# Patient Record
Sex: Male | Born: 1945
Health system: Southern US, Community
[De-identification: ages and names within clinical notes are randomized; demographics above are authoritative.]

## PROBLEM LIST (undated history)

## (undated) DIAGNOSIS — N529 Male erectile dysfunction, unspecified: Secondary | ICD-10-CM

## (undated) DIAGNOSIS — C801 Malignant (primary) neoplasm, unspecified: Secondary | ICD-10-CM

## (undated) DIAGNOSIS — L570 Actinic keratosis: Secondary | ICD-10-CM

## (undated) DIAGNOSIS — M1990 Unspecified osteoarthritis, unspecified site: Secondary | ICD-10-CM

## (undated) DIAGNOSIS — T7840XA Allergy, unspecified, initial encounter: Secondary | ICD-10-CM

## (undated) DIAGNOSIS — E785 Hyperlipidemia, unspecified: Secondary | ICD-10-CM

## (undated) DIAGNOSIS — N400 Enlarged prostate without lower urinary tract symptoms: Secondary | ICD-10-CM

## (undated) DIAGNOSIS — H269 Unspecified cataract: Secondary | ICD-10-CM

## (undated) HISTORY — DX: Benign prostatic hyperplasia without lower urinary tract symptoms: N40.0

## (undated) HISTORY — DX: Male erectile dysfunction, unspecified: N52.9

## (undated) HISTORY — DX: Hyperlipidemia, unspecified: E78.5

## (undated) HISTORY — PX: CERVICAL SPINE SURGERY: SHX589

## (undated) HISTORY — DX: Actinic keratosis: L57.0

## (undated) HISTORY — DX: Unspecified osteoarthritis, unspecified site: M19.90

## (undated) HISTORY — DX: Unspecified cataract: H26.9

## (undated) HISTORY — DX: Malignant (primary) neoplasm, unspecified: C80.1

## (undated) HISTORY — PX: EYE SURGERY: SHX253

## (undated) HISTORY — PX: OTHER SURGICAL HISTORY: SHX169

## (undated) HISTORY — DX: Allergy, unspecified, initial encounter: T78.40XA

## (undated) HISTORY — PX: CATARACT EXTRACTION: SUR2

---

## 2003-03-28 ENCOUNTER — Ambulatory Visit (HOSPITAL_COMMUNITY): Admission: RE | Admit: 2003-03-28 | Discharge: 2003-03-28 | Payer: Self-pay | Admitting: Gastroenterology

## 2004-03-09 ENCOUNTER — Encounter: Admission: RE | Admit: 2004-03-09 | Discharge: 2004-03-09 | Payer: Self-pay | Admitting: Orthopedic Surgery

## 2006-03-26 ENCOUNTER — Encounter: Admission: RE | Admit: 2006-03-26 | Discharge: 2006-03-26 | Payer: Self-pay | Admitting: Orthopedic Surgery

## 2006-05-25 ENCOUNTER — Encounter: Admission: RE | Admit: 2006-05-25 | Discharge: 2006-05-25 | Payer: Self-pay | Admitting: Neurological Surgery

## 2006-06-25 ENCOUNTER — Ambulatory Visit (HOSPITAL_COMMUNITY): Admission: RE | Admit: 2006-06-25 | Discharge: 2006-06-26 | Payer: Self-pay | Admitting: Neurological Surgery

## 2006-07-30 ENCOUNTER — Encounter: Admission: RE | Admit: 2006-07-30 | Discharge: 2006-07-30 | Payer: Self-pay | Admitting: Neurological Surgery

## 2006-09-22 ENCOUNTER — Encounter: Admission: RE | Admit: 2006-09-22 | Discharge: 2006-09-22 | Payer: Self-pay | Admitting: Neurological Surgery

## 2006-12-22 ENCOUNTER — Encounter: Admission: RE | Admit: 2006-12-22 | Discharge: 2006-12-22 | Payer: Self-pay | Admitting: Neurological Surgery

## 2007-06-15 ENCOUNTER — Encounter: Admission: RE | Admit: 2007-06-15 | Discharge: 2007-06-15 | Payer: Self-pay | Admitting: Neurological Surgery

## 2007-10-07 HISTORY — PX: COLONOSCOPY: SHX174

## 2008-06-09 ENCOUNTER — Ambulatory Visit: Payer: Self-pay | Admitting: Gastroenterology

## 2008-06-23 ENCOUNTER — Ambulatory Visit: Payer: Self-pay | Admitting: Gastroenterology

## 2008-09-10 ENCOUNTER — Emergency Department (HOSPITAL_COMMUNITY): Admission: EM | Admit: 2008-09-10 | Discharge: 2008-09-10 | Payer: Self-pay | Admitting: Emergency Medicine

## 2011-02-21 NOTE — Op Note (Signed)
Samuel Wheeler, Samuel Wheeler NO.:  [id number]   MEDICAL RECORD NO.:  [id number]          PATIENT TYPE:  OIB   LOCATION:  3172                         FACILITY:  [facility]   PHYSICIAN:  Tia Alert, MD     DATE OF BIRTH:  1946/06/06   DATE OF PROCEDURE:  06/25/2006  DATE OF DISCHARGE:                                 OPERATIVE REPORT   PREOPERATIVE DIAGNOSIS:  Cervical spondylosis C3-4 and C5-6 with neural  foraminal stenosis C3-4 on the left and C5-6 on left with left neck and  shoulder pain.   POSTOP DIAGNOSIS:  Cervical spondylosis C3-4 and C5-6 with neural foraminal  stenosis C3-4 on the left and C5-6 on left with left neck and shoulder pain.   PROCEDURE:  1. Decompressive anterior cervical diskectomy C3-4 and C5-6.  2. Anterior cervical fusion C3-4 and C5-6 utilizing a 7-mm PEEK interbody      cage packed with local autograft and DBX putty at C3-4; and a 6-mm      interbody PEEK cage packed with local autograft and DBX putty at C5-6.  3. Anterior cervical plating C3-4 and C5-6 utilizing two 25-mm Venture      plates with 04-VW screws.   SURGEON:  Dr. Marikay Alar   ASSISTANT:  Reinaldo Meeker, M.D.   ANESTHESIA:  General endotracheal.   COMPLICATIONS:  None apparent.   INDICATIONS FOR THE PROCEDURE:  Samuel Wheeler is a 65 year old white male who is  referred with severe neck and left arm pain.  He had an MRI and then a CT  myelogram which showed spondylosis at C3-4 and C5-6 with cut off of the  nerve roots sleeves at C4and C6 on the left side.  I recommended a two-level  anterior cervical diskectomy C3-4, C5-6.  He understood the risks, benefits,  and expected outcome; and wished to proceed.   DESCRIPTION OF PROCEDURE:  The patient was taken to operating room and after  induction of adequate generalized endotracheal anesthesia, he was placed in  the supine position on the operating room table.  His right anterior  cervical region was prepped with DuraPrep and  then draped in the usual  sterile fashion.  Then 4 mL of local anesthesia was injected; and a  transverse incision was made to the right of midline and carried down to the  platysma which was elevated, opened, and undermined with Metzenbaum  scissors.  I then dissected a plane medial to the sternocleidomastoid muscle  and internal carotid artery, and lateral to the trachea and esophagus to  expose C3-4 and C5-6.  Intraoperative fluoroscopy confirmed my level at C3-4  and at C5-6; and the longus colli muscles were taken down bilaterally, and  the shadow line retractors were placed under these to expose C3-4 and C5-6.   I incised the annulus at C3-4 and did the initial diskectomy with pituitary  rongeurs and curved curettes.  We then drilled the endplates with the high-  speed drill down to the level of the posterior longitudinal ligament.  We  saved the drill shavings in a mucous trap for  later use during arthrodesis.  We brought in the operating microscope, and opened the posterior and  longitudinal ligament with a nerve hook; and then undercut the bodies of C3  and C4 with the 2-mm and 1-mm Kerrison punch, decompressing the central  canal and bilateral foramina.  The C4 nerve root was identified bilaterally  and fully decompressed.  We could see a significant expanse of the nerve  root, especially on the left side.  We went along the superior endplate of  C4 until we found the pedicle and marched out the pedicle, decompressing the  nerve root along the way.  We then palpated with a nerve hook, in  circumferential fashion, to assure adequate decompression of the central  canal and the bilateral foramina.   We dried the interspace with Gelfoam, removed the Gelfoam, irrigated the  interspace, and then measured the interspace to be 7 mm.  We used a 7 mm  PEEK interbody cage packed with local autograft and DBX putty and tapped  this into position at C3-4.  We then used to a 25 mm Venture  plate; and  placed two 13-mm variable angle screws into the bodies of C3 and C4.  These  were locked into position with a locking mechanism on the plate.  We then  went down to C5-6 where we incised the disk and the annulus, and performed  the initial diskectomy with pituitary rongeurs and curved curettes.  We then  brought in the operating microscope and opened the posterior longitudinal  ligament with a hook after drilling the endplates down to the level of the  posterior longitudinal ligament.  The drill shavings were, once again, saved  for later arthrodesis.  We undercut the body of C5 and C6 to decompress the  central canal, and then performed bilateral foraminotomies; again, marching  along the superior part of the pedicle to decompress the nerve root.  The  nerve root was identified and decompressed for a significant distance into  the foramen.  We then palpated with a nerve hook to assure adequate  decompression.  I measured the interspace to be 6 mm.  At this time we used  a 6-mm PEEK interbody cage packed local autograft and DBX putty and tapped  these into position at C5-6.   We then irrigated with saline solution containing bacitracin, dried all  bleeding points with bipolar cautery, and used a 25-mm Venture plate at I6-9  and placed two 13-mm variable angle screws into the bodies of C5 and C6.  We  irrigated, once again, dried all bleeding points with bipolar cautery; and  once meticulous hemostasis was achieved, we placed a 7-flat JP drain through  a separate stab incision, and tied this into place; and then closed the  platysma with 3-0 Vicryl, closed the subcuticular tissue with 3-0 Vicryl,  and closed skin with Benzoin and Steri-Strips.  The drapes removed and a  sterile dressing was applied.  The patient was awakened from general  anesthesia; and transported to the recovery room in stable condition.  At the end of procedure all sponge, needle, and sponge counts were  correct.      Tia Alert, MD  Electronically Signed     DSJ/MEDQ  D:  06/25/2006  T:  06/27/2006  Job:  [id number]

## 2012-02-19 DIAGNOSIS — L0291 Cutaneous abscess, unspecified: Secondary | ICD-10-CM | POA: Diagnosis not present

## 2012-02-24 DIAGNOSIS — M25519 Pain in unspecified shoulder: Secondary | ICD-10-CM | POA: Diagnosis not present

## 2012-02-25 DIAGNOSIS — M25519 Pain in unspecified shoulder: Secondary | ICD-10-CM | POA: Diagnosis not present

## 2012-02-26 DIAGNOSIS — M25519 Pain in unspecified shoulder: Secondary | ICD-10-CM | POA: Diagnosis not present

## 2012-03-08 DIAGNOSIS — L600 Ingrowing nail: Secondary | ICD-10-CM | POA: Diagnosis not present

## 2012-03-16 DIAGNOSIS — L600 Ingrowing nail: Secondary | ICD-10-CM | POA: Diagnosis not present

## 2012-03-16 DIAGNOSIS — M79609 Pain in unspecified limb: Secondary | ICD-10-CM | POA: Diagnosis not present

## 2012-03-16 DIAGNOSIS — L02619 Cutaneous abscess of unspecified foot: Secondary | ICD-10-CM | POA: Diagnosis not present

## 2012-03-23 DIAGNOSIS — M719 Bursopathy, unspecified: Secondary | ICD-10-CM | POA: Diagnosis not present

## 2012-03-23 DIAGNOSIS — M75100 Unspecified rotator cuff tear or rupture of unspecified shoulder, not specified as traumatic: Secondary | ICD-10-CM | POA: Diagnosis not present

## 2012-03-23 DIAGNOSIS — M67919 Unspecified disorder of synovium and tendon, unspecified shoulder: Secondary | ICD-10-CM | POA: Diagnosis not present

## 2012-03-23 DIAGNOSIS — M948X9 Other specified disorders of cartilage, unspecified sites: Secondary | ICD-10-CM | POA: Diagnosis not present

## 2012-03-31 DIAGNOSIS — S46819A Strain of other muscles, fascia and tendons at shoulder and upper arm level, unspecified arm, initial encounter: Secondary | ICD-10-CM | POA: Diagnosis not present

## 2012-03-31 DIAGNOSIS — S43499A Other sprain of unspecified shoulder joint, initial encounter: Secondary | ICD-10-CM | POA: Diagnosis not present

## 2012-04-15 DIAGNOSIS — M75120 Complete rotator cuff tear or rupture of unspecified shoulder, not specified as traumatic: Secondary | ICD-10-CM | POA: Diagnosis not present

## 2012-04-21 DIAGNOSIS — M75120 Complete rotator cuff tear or rupture of unspecified shoulder, not specified as traumatic: Secondary | ICD-10-CM | POA: Diagnosis not present

## 2012-04-28 DIAGNOSIS — M75120 Complete rotator cuff tear or rupture of unspecified shoulder, not specified as traumatic: Secondary | ICD-10-CM | POA: Diagnosis not present

## 2012-05-03 DIAGNOSIS — M75120 Complete rotator cuff tear or rupture of unspecified shoulder, not specified as traumatic: Secondary | ICD-10-CM | POA: Diagnosis not present

## 2012-05-05 DIAGNOSIS — M75120 Complete rotator cuff tear or rupture of unspecified shoulder, not specified as traumatic: Secondary | ICD-10-CM | POA: Diagnosis not present

## 2012-05-11 DIAGNOSIS — M75120 Complete rotator cuff tear or rupture of unspecified shoulder, not specified as traumatic: Secondary | ICD-10-CM | POA: Diagnosis not present

## 2012-05-13 DIAGNOSIS — M75120 Complete rotator cuff tear or rupture of unspecified shoulder, not specified as traumatic: Secondary | ICD-10-CM | POA: Diagnosis not present

## 2012-05-18 DIAGNOSIS — M75120 Complete rotator cuff tear or rupture of unspecified shoulder, not specified as traumatic: Secondary | ICD-10-CM | POA: Diagnosis not present

## 2012-05-25 DIAGNOSIS — M75120 Complete rotator cuff tear or rupture of unspecified shoulder, not specified as traumatic: Secondary | ICD-10-CM | POA: Diagnosis not present

## 2012-06-01 DIAGNOSIS — M75120 Complete rotator cuff tear or rupture of unspecified shoulder, not specified as traumatic: Secondary | ICD-10-CM | POA: Diagnosis not present

## 2012-06-08 DIAGNOSIS — M75120 Complete rotator cuff tear or rupture of unspecified shoulder, not specified as traumatic: Secondary | ICD-10-CM | POA: Diagnosis not present

## 2012-06-15 DIAGNOSIS — M75120 Complete rotator cuff tear or rupture of unspecified shoulder, not specified as traumatic: Secondary | ICD-10-CM | POA: Diagnosis not present

## 2012-07-06 DIAGNOSIS — Z23 Encounter for immunization: Secondary | ICD-10-CM | POA: Diagnosis not present

## 2012-07-14 DIAGNOSIS — R1310 Dysphagia, unspecified: Secondary | ICD-10-CM | POA: Diagnosis not present

## 2012-07-14 DIAGNOSIS — D131 Benign neoplasm of stomach: Secondary | ICD-10-CM | POA: Diagnosis not present

## 2012-07-28 DIAGNOSIS — H5319 Other subjective visual disturbances: Secondary | ICD-10-CM | POA: Diagnosis not present

## 2012-10-18 DIAGNOSIS — M25519 Pain in unspecified shoulder: Secondary | ICD-10-CM | POA: Diagnosis not present

## 2012-10-18 DIAGNOSIS — M109 Gout, unspecified: Secondary | ICD-10-CM | POA: Diagnosis not present

## 2012-10-18 DIAGNOSIS — R5383 Other fatigue: Secondary | ICD-10-CM | POA: Diagnosis not present

## 2012-10-18 DIAGNOSIS — M2550 Pain in unspecified joint: Secondary | ICD-10-CM | POA: Diagnosis not present

## 2012-10-18 DIAGNOSIS — N400 Enlarged prostate without lower urinary tract symptoms: Secondary | ICD-10-CM | POA: Diagnosis not present

## 2012-10-18 DIAGNOSIS — E785 Hyperlipidemia, unspecified: Secondary | ICD-10-CM | POA: Diagnosis not present

## 2012-10-18 DIAGNOSIS — R5381 Other malaise: Secondary | ICD-10-CM | POA: Diagnosis not present

## 2012-10-18 DIAGNOSIS — E559 Vitamin D deficiency, unspecified: Secondary | ICD-10-CM | POA: Diagnosis not present

## 2012-11-15 DIAGNOSIS — E785 Hyperlipidemia, unspecified: Secondary | ICD-10-CM | POA: Diagnosis not present

## 2012-11-15 DIAGNOSIS — IMO0001 Reserved for inherently not codable concepts without codable children: Secondary | ICD-10-CM | POA: Diagnosis not present

## 2013-01-24 ENCOUNTER — Encounter: Payer: Self-pay | Admitting: Family Medicine

## 2013-01-27 ENCOUNTER — Ambulatory Visit (INDEPENDENT_AMBULATORY_CARE_PROVIDER_SITE_OTHER): Payer: Medicare Other | Admitting: Family Medicine

## 2013-01-27 ENCOUNTER — Encounter: Payer: Self-pay | Admitting: Family Medicine

## 2013-01-27 VITALS — BP 136/85 | HR 63 | Temp 97.8°F | Ht 69.5 in | Wt 210.4 lb

## 2013-01-27 DIAGNOSIS — A6000 Herpesviral infection of urogenital system, unspecified: Secondary | ICD-10-CM

## 2013-01-27 DIAGNOSIS — Z125 Encounter for screening for malignant neoplasm of prostate: Secondary | ICD-10-CM | POA: Diagnosis not present

## 2013-01-27 DIAGNOSIS — N529 Male erectile dysfunction, unspecified: Secondary | ICD-10-CM | POA: Insufficient documentation

## 2013-01-27 DIAGNOSIS — B999 Unspecified infectious disease: Secondary | ICD-10-CM

## 2013-01-27 DIAGNOSIS — N400 Enlarged prostate without lower urinary tract symptoms: Secondary | ICD-10-CM | POA: Insufficient documentation

## 2013-01-27 DIAGNOSIS — Z2911 Encounter for prophylactic immunotherapy for respiratory syncytial virus (RSV): Secondary | ICD-10-CM | POA: Diagnosis not present

## 2013-01-27 DIAGNOSIS — Z0000 Encounter for general adult medical examination without abnormal findings: Secondary | ICD-10-CM

## 2013-01-27 DIAGNOSIS — Z23 Encounter for immunization: Secondary | ICD-10-CM | POA: Insufficient documentation

## 2013-01-27 DIAGNOSIS — E785 Hyperlipidemia, unspecified: Secondary | ICD-10-CM | POA: Insufficient documentation

## 2013-01-27 DIAGNOSIS — L570 Actinic keratosis: Secondary | ICD-10-CM | POA: Insufficient documentation

## 2013-01-27 LAB — COMPLETE METABOLIC PANEL WITH GFR
ALT: 23 U/L (ref 0–53)
AST: 19 U/L (ref 0–37)
Albumin: 4.1 g/dL (ref 3.5–5.2)
Alkaline Phosphatase: 63 U/L (ref 39–117)
BUN: 20 mg/dL (ref 6–23)
CO2: 28 mEq/L (ref 19–32)
Calcium: 9.2 mg/dL (ref 8.4–10.5)
Chloride: 106 mEq/L (ref 96–112)
Creat: 1.02 mg/dL (ref 0.50–1.35)
GFR, Est African American: 89 mL/min
GFR, Est Non African American: 77 mL/min
Glucose, Bld: 101 mg/dL — ABNORMAL HIGH (ref 70–99)
Potassium: 4.6 mEq/L (ref 3.5–5.3)
Sodium: 140 mEq/L (ref 135–145)
Total Bilirubin: 0.9 mg/dL (ref 0.3–1.2)
Total Protein: 6.8 g/dL (ref 6.0–8.3)

## 2013-01-27 LAB — PSA: PSA: 1.15 ng/mL (ref <=4.00)

## 2013-01-27 LAB — HEPATITIS C ANTIBODY: HCV Ab: NEGATIVE

## 2013-01-27 NOTE — Progress Notes (Signed)
Tolerated injection well. 

## 2013-01-27 NOTE — Patient Instructions (Addendum)
Immunizations: Tetanus-Diphtheria Booster due:2020 Pertusis Booster due:2020 Flu Shot Due:in the Fall Pneumonia Vaccine:given at 65 years Herpes Zoster/Shingles Vaccine JXB:JYNWG HPV due:n/a  Healthy Life Habits: Exercise Goal: 5-6 days/week; start gradually(ie 30 minutes/3days per week) Nutrition: Balanced healthy meals including Vegetables and Fruits. Consider  Reading the following books  :1) Eat to Live by Dr Ottis Stain ;2) Prevent and Reverse Heart Disease by Dr Suzzette Righter. Vitamins: okay to take a multivitamin Aspirin:81mg  three times a week Stop Tobacco Use:n/a Seat Belt Use:++++ Sunscreen Use:++++ Osteoporosis Prevention: 1) Exercise 2) Calcium/Vitamin D requirements:he [employer] of the [employer] recommends:    Calcium:  800 mg/day for children 96-90 years of age          66 mg/day for children 85-78 years of age          66 mg/day for adults 5-45 years of age          66 mg/day for everyone more than 66 years of age     Vitamin D: 800 IU per day or as prescribed if you are deficient.  Recommended Screening Tests: Colon Cancer Screening:with 's Blood work: Cholesterol Screening: ++       HIV:  ++       Hepatitis C(people born 1945-1965):++ Mammogram:n/a DEXA/Bone Density:n/a GYN Exam:n/a Monthly Self Breast Exam:n/a Monthly Self Testicular Exam:++++ Eye Exam: at least every 1 to 2 years as recommended by your eye specialist Dental Health:at least every 6 months  Others: Living Will/Healthcare Power of Attorney:+++++  Shingles Shingles is caused by the same virus that causes chickenpox (varicella zoster virus or VZV). Shingles often occurs many years or decades after having chickenpox. That is why it is more common in adults older than 50 years. The virus reactivates and breaks out as an infection in a nerve root. SYMPTOMS   The initial feeling (sensations) may be pain. This pain is usually described  as:  Burning.  Stabbing.  Throbbing.  Tingling in the nerve root.  A red rash will follow in a couple days. The rash may occur in any area of the body and is usually on one side (unilateral) of the body in a band or belt-like pattern. The rash usually starts out as very small blisters (vesicles). They will dry up after 7 to 10 days. This is not usually a significant problem except for the pain it causes.  Long-lasting (chronic) pain is more likely in an elderly person. It can last months to years. This condition is called postherpetic neuralgia. Shingles can be an extremely severe infection in someone with AIDS, a weakened immune system, or with forms of leukemia. It can also be severe if you are taking transplant medicines or other medicines that weaken the immune system. TREATMENT  Your caregiver will often treat you with:  Antiviral drugs.  Anti-inflammatory drugs.  Pain medicines. Bed rest is very important in preventing the pain associated with herpes zoster (postherpetic neuralgia). Application of heat in the form of a hot water bottle or electric heating pad or gentle pressure with the hand is recommended to help with the pain or discomfort. PREVENTION  A varicella zoster vaccine is available to help protect against the virus. The Food and Drug Administration approved the varicella zoster vaccine for individuals 43 years of age and older. HOME CARE INSTRUCTIONS   Cool compresses to the area of rash may be helpful.  Only take over-the-counter or prescription medicines for pain, discomfort, or fever as directed by  your caregiver.  Avoid contact with:  Babies.  Pregnant women.  Children with eczema.  Elderly people with transplants.  People with chronic illnesses, such as leukemia and AIDS.  If the area involved is on your face, you may receive a referral for follow-up to a specialist. It is very important to keep all follow-up appointments. This will help avoid eye  complications, chronic pain, or disability. SEEK IMMEDIATE MEDICAL CARE IF:   You develop any pain (headache) in the area of the face or eye. This must be followed carefully by your caregiver or ophthalmologist. An infection in part of your eye (cornea) can be very serious. It could lead to blindness.  You do not have pain relief from prescribed medicines.  Your redness or swelling spreads.  The area involved becomes very swollen and painful.  You have a fever.  You notice any red or painful lines extending away from the affected area toward your heart (lymphangitis).  Your condition is worsening or has changed. Document Released: 09/22/2005 Document Revised: 12/15/2011 Document Reviewed: 08/27/2009 [facility] Patient Information 2013 [place], [place].

## 2013-01-27 NOTE — Progress Notes (Signed)
Patient ID: Samuel Wheeler, male   DOB: [birth year], 67 y.o.   MRN: [id number] SUBJECTIVE: HPI: Here for an annual physical. Feels fine. Problems stable with meds no complaints   PMH/PSH: reviewed/updated in Epic  SH/FH: reviewed/updated in Epic  Allergies: reviewed/updated in Epic  Medications: reviewed/updated in Epic  Immunizations: reviewed/updated in Epic  ROS: As above in the HPI. All other systems are stable or negative.  OBJECTIVE: APPEARANCE: White male centrally obese  Patient in no acute distress.The patient appeared well nourished and normally developed. Acyanotic. Waist:41.25 inches VITAL SIGNS:BP 136/85  Pulse 63  Temp(Src) 97.8 F (36.6 C) (Oral)  Ht 5' 9.5" (1.765 m)  Wt 210 lb 6.4 oz (95.437 kg)  BMI 30.64 kg/m2   SKIN: warm and  Dry without overt rashes, tattoos and scars. Few scatterred benign nevi &  HEAD and Neck: without JVD, Head and scalp: normal Eyes:No scleral icterus. Fundi normal, eye movements normal. Ears: Auriclehas a scaly lesion on the edge., canal normal, Tympanic membranes normal, insufflation normal. Nose: normal Throat: normal Neck & thyroid: normal  CHEST & LUNGS: Chest wall: normal Lungs: Clear  CVS: Reveals the PMI to be normally located. Regular rhythm, First and Second Heart sounds are normal,  absence of murmurs, rubs or gallops. Peripheral vasculature: Radial pulses: normal Dorsal pedis pulses: normal Posterior pulses: normal  ABDOMEN:  Appearance: normal Benign,, no organomegaly, no masses, no Abdominal Aortic enlargement. No Guarding , no rebound. No Bruits. Bowel sounds: normal  RECTAL: normal heme negative GU: varicoceles and cysts felt. , no testicular lumps  EXTREMETIES: nonedematous. Both Femoral and Pedal pulses are normal.  MUSCULOSKELETAL:  Spine: normal Joints: intact  NEUROLOGIC: oriented to time,place and person; nonfocal. Strength is normal Sensory is normal Reflexes are  normal Cranial Nerves are normal.  ASSESSMENT: Medicare annual wellness visit, initial - Plan: EKG 12-Lead, PSA  HLD (hyperlipidemia) - Plan: COMPLETE METABOLIC PANEL WITH GFR, NMR Lipoprofile with Lipids  BPH (benign prostatic hyperplasia)  Erectile dysfunction  Need for zoster vaccination - Plan: Varicella-zoster vaccine subcutaneous  Genital herpes simplex  Communicable disease - Plan: Hepatitis C antibody  Actinic keratosis    PLAN:   mmunizations: Tetanus-Diphtheria Booster due:2020 Pertusis Booster due:2020 Flu Shot Due:in the Fall Pneumonia Vaccine:given at 65 years Herpes Zoster/Shingles Vaccine FAO:ZHYQM HPV due:n/a  Healthy Life Habits: Exercise Goal: 5-6 days/week; start gradually(ie 30 minutes/3days per week) Nutrition: Balanced healthy meals including Vegetables and Fruits. Consider  Reading the following books  :1) Eat to Live by Dr Ottis Stain ;2) Prevent and Reverse Heart Disease by Dr Suzzette Righter. Vitamins: okay to take a multivitamin Aspirin:81mg  three times a week Stop Tobacco Use:n/a Seat Belt Use:++++ Sunscreen Use:++++ Osteoporosis Prevention: 1) Exercise 2) Calcium/Vitamin D requirements:he [employer] of the [employer] recommends:    Calcium:  800 mg/day for children 74-58 years of age          66 mg/day for children 15-41 years of age          66 mg/day for adults 17-66 years of age          66 mg/day for everyone more than 66 years of age     Vitamin D: 800 IU per day or as prescribed if you are deficient.  Recommended Screening Tests: Colon Cancer Screening:with [place]'s Blood work: Cholesterol Screening: ++       HIV:  ++       Hepatitis C(people born 1945-1965):++ Mammogram:n/a DEXA/Bone Density:n/a GYN Exam:n/a Monthly  Self Breast Exam:n/a Monthly Self Testicular Exam:++++ Eye Exam: at least every 1 to 2 years as recommended by your eye specialist Dental Health:at least every 6  months  Others: Living Will/Healthcare Power of Attorney:+++++  Orders Placed This Encounter  Procedures  . Varicella-zoster vaccine subcutaneous  . COMPLETE METABOLIC PANEL WITH GFR  . NMR Lipoprofile with Lipids  . PSA  . Hepatitis C antibody  . EKG 12-Lead   Meds ordered this encounter  Medications  . Cetirizine HCl (ZYRTEC ALLERGY) 10 MG CAPS    Sig: Take 1 capsule by mouth daily.  Marland Kitchen dutasteride (AVODART) 0.5 MG capsule    Sig: Take 0.5 mg by mouth daily.  . niacin (NIASPAN) 1000 MG CR tablet    Sig: Take 1,000 mg by mouth at bedtime.  . mometasone (NASONEX) 50 MCG/ACT nasal spray    Sig: Place 2 sprays into the nose daily.  Marland Kitchen doxazosin (CARDURA) 2 MG tablet    Sig: Take 2 mg by mouth at bedtime.  Marland Kitchen atorvastatin (LIPITOR) 20 MG tablet    Sig: Take 20 mg by mouth daily.  . valACYclovir (VALTREX) 1000 MG tablet    Sig: Take 1,000 mg by mouth daily.    RTC in 6 months.  Kaegan Hettich P. [place] Charon, M.D.

## 2013-01-28 LAB — NMR LIPOPROFILE WITH LIPIDS
Cholesterol, Total: 149 mg/dL (ref <200)
HDL Particle Number: 31 umol/L (ref >=30.5)
HDL Size: 9.4 nm (ref >=9.2)
HDL-C: 41 mg/dL (ref >=40)
LDL (calc): 83 mg/dL (ref <100)
LDL Particle Number: 1031 nmol/L — ABNORMAL HIGH (ref <1000)
LDL Size: 19.7 nm — ABNORMAL LOW (ref >20.5)
LP-IR Score: 60 — ABNORMAL HIGH (ref <=45)
Large HDL-P: 5.1 umol/L (ref >=4.8)
Large VLDL-P: 4.7 nmol/L — ABNORMAL HIGH (ref <=2.7)
Small LDL Particle Number: 787 nmol/L — ABNORMAL HIGH (ref <=527)
Triglycerides: 123 mg/dL (ref <150)
VLDL Size: 55.3 nm — ABNORMAL HIGH (ref <=46.6)

## 2013-02-09 ENCOUNTER — Other Ambulatory Visit: Payer: Self-pay

## 2013-02-09 NOTE — Telephone Encounter (Signed)
Mail order  Last seen 11/15/12   Print and have nurse call patient to pick up

## 2013-02-11 ENCOUNTER — Other Ambulatory Visit: Payer: Self-pay | Admitting: Family Medicine

## 2013-02-11 MED ORDER — DUTASTERIDE 0.5 MG PO CAPS: 0.5000 mg | ORAL_CAPSULE | Freq: Every day | ORAL | Status: DC

## 2013-03-03 ENCOUNTER — Telehealth: Payer: Self-pay | Admitting: *Deleted

## 2013-03-03 DIAGNOSIS — N400 Enlarged prostate without lower urinary tract symptoms: Secondary | ICD-10-CM

## 2013-03-03 MED ORDER — FINASTERIDE 5 MG PO TABS: 5.0000 mg | ORAL_TABLET | Freq: Every day | ORAL | Status: DC

## 2013-03-03 NOTE — Telephone Encounter (Signed)
Needs a Rx for finasteride 5mg . For mail order, ins.is making him change from avodart to this.. Bring printed rx to [facility]

## 2013-03-03 NOTE — Telephone Encounter (Signed)
Rx written for 5mg  finesteride # 90 RFx3.  D/C the avodart Noted in EPIC

## 2013-03-12 ENCOUNTER — Ambulatory Visit (INDEPENDENT_AMBULATORY_CARE_PROVIDER_SITE_OTHER): Payer: Medicare Other | Admitting: Family Medicine

## 2013-03-12 VITALS — BP 136/78 | HR 80 | Temp 97.5°F | Wt 213.0 lb

## 2013-03-12 DIAGNOSIS — R350 Frequency of micturition: Secondary | ICD-10-CM | POA: Diagnosis not present

## 2013-03-12 DIAGNOSIS — J019 Acute sinusitis, unspecified: Secondary | ICD-10-CM | POA: Diagnosis not present

## 2013-03-12 DIAGNOSIS — R351 Nocturia: Secondary | ICD-10-CM

## 2013-03-12 DIAGNOSIS — IMO0001 Reserved for inherently not codable concepts without codable children: Secondary | ICD-10-CM

## 2013-03-12 LAB — POCT URINALYSIS DIPSTICK
Bilirubin, UA: NEGATIVE
Blood, UA: NEGATIVE
Glucose, UA: NEGATIVE
Ketones, UA: NEGATIVE
Leukocytes, UA: NEGATIVE
Nitrite, UA: NEGATIVE
Protein, UA: NEGATIVE
Spec Grav, UA: 1.015
Urobilinogen, UA: 0.2
pH, UA: 5

## 2013-03-12 MED ORDER — AMOXICILLIN 875 MG PO TABS: 875.0000 mg | ORAL_TABLET | Freq: Two times a day (BID) | ORAL | Status: DC

## 2013-03-12 NOTE — Progress Notes (Signed)
Patient ID: Samuel Wheeler, male   DOB: Feb 07, 1946, 67 y.o.   MRN: [id number] SUBJECTIVE: Chief Complaint  Patient presents with  . Acute Visit    HEAD COLD  using aleve d   HPI: Has a head cold and pressure in the sinuses. Used allegra -D and then last night his urine changed. He started to have frequency of urination and hesitancy.no discharge , no std, no back pain, no hematuria. No discharge. No fever, no chills.  PMH/PSH: reviewed/updated in Epic  SH/FH: reviewed/updated in Epic  Allergies: reviewed/updated in Epic  Medications: reviewed/updated in Epic  Immunizations: reviewed/updated in Epic  ROS: As above in the HPI. All other systems are stable or negative.  OBJECTIVE: APPEARANCE:  Patient in no acute distress.The patient appeared well nourished and normally developed. Acyanotic. Waist: VITAL SIGNS:BP 136/78  Pulse 80  Temp(Src) 97.5 F (36.4 C) (Oral)  Wt 213 lb (96.616 kg)  BMI 31.01 kg/m2 Obese WM  SKIN: warm and  Dry without overt rashes, tattoos and scars  HEAD and Neck: without JVD, Head and scalp: normal Eyes:No scleral icterus. Fundi normal, eye movements normal. Ears: Auricle normal, canal normal, Tympanic membranes normal, insufflation normal. Nose: normal Throat: normal Neck & thyroid: normal  CHEST & LUNGS: Chest wall: normal Lungs: Clear  CVS: Reveals the PMI to be normally located. Regular rhythm, First and Second Heart sounds are normal,  absence of murmurs, rubs or gallops. Peripheral vasculature: Radial pulses: normal Dorsal pedis pulses: normal Posterior pulses: normal  ABDOMEN:  Appearance: obese Benign, no organomegaly, no masses, no Abdominal Aortic enlargement. No Guarding , no rebound. No Bruits. Bowel sounds: normal  RECTAL: N/A GU: N/A  EXTREMETIES: nonedematous. Both Femoral and Pedal pulses are normal.  MUSCULOSKELETAL:  Spine: normal Joints: intact  NEUROLOGIC: oriented to time,place and person;  nonfocal. Strength is normal   ASSESSMENT: Nocturia - Plan: CANCELED: POCT urinalysis dipstick  Frequency - Plan: POCT urinalysis dipstick, CANCELED: POCT urinalysis dipstick  Sinusitis, acute - Plan: amoxicillin (AMOXIL) 875 MG tablet  PLAN: Orders Placed This Encounter  Procedures  . POCT urinalysis dipstick   Results for orders placed in visit on 03/12/13  POCT URINALYSIS DIPSTICK      Result Value Range   Color, UA yellow     Clarity, UA clear     Glucose, UA neg     Bilirubin, UA neg     Ketones, UA neg     Spec Grav, UA 1.015     Blood, UA neg     pH, UA 5.0     Protein, UA neg     Urobilinogen, UA 0.2     Nitrite, UA neg     Leukocytes, UA Negative      Meds ordered this encounter  Medications  . naproxen sodium (ANAPROX) 220 MG tablet    Sig: Take 220 mg by mouth 2 (two) times daily with a meal. Uses prn  . amoxicillin (AMOXIL) 875 MG tablet    Sig: Take 1 tablet (875 mg total) by mouth 2 (two) times daily.    Dispense:  20 tablet    Refill:  0  avoid decongestants due to his BPH and urinary symptoms.  Return if symptoms worsen or fail to improve, for Recheck medical problems.   Donn Wilmot P. [place] Charon, M.D.

## 2013-03-15 ENCOUNTER — Telehealth: Payer: Self-pay | Admitting: Family Medicine

## 2013-03-15 ENCOUNTER — Other Ambulatory Visit: Payer: Self-pay | Admitting: Family Medicine

## 2013-03-15 DIAGNOSIS — R059 Cough, unspecified: Secondary | ICD-10-CM

## 2013-03-15 DIAGNOSIS — R05 Cough: Secondary | ICD-10-CM

## 2013-03-15 MED ORDER — BENZONATATE 200 MG PO CAPS: 200.0000 mg | ORAL_CAPSULE | Freq: Three times a day (TID) | ORAL | Status: DC | PRN

## 2013-03-15 NOTE — Telephone Encounter (Signed)
Use his nasonex. Continue same meds. Will order something for cough

## 2013-03-15 NOTE — Telephone Encounter (Signed)
Pt notified rx called to [facility]

## 2013-03-18 ENCOUNTER — Ambulatory Visit (INDEPENDENT_AMBULATORY_CARE_PROVIDER_SITE_OTHER): Payer: Medicare Other | Admitting: Nurse Practitioner

## 2013-03-18 VITALS — BP 119/78 | HR 67 | Temp 97.7°F | Ht 69.5 in | Wt 208.5 lb

## 2013-03-18 DIAGNOSIS — J209 Acute bronchitis, unspecified: Secondary | ICD-10-CM | POA: Diagnosis not present

## 2013-03-18 MED ORDER — AZITHROMYCIN 250 MG PO TABS: ORAL_TABLET | ORAL | Status: DC

## 2013-03-18 MED ORDER — METHYLPREDNISOLONE ACETATE 80 MG/ML IJ SUSP
80.0000 mg | Freq: Once | INTRAMUSCULAR | Status: AC
  Administered 2013-03-18: 80 mg via INTRAMUSCULAR

## 2013-03-18 NOTE — Patient Instructions (Signed)

## 2013-03-18 NOTE — Progress Notes (Signed)
  Subjective:    Patient ID: Samuel Wheeler, male    DOB: 04-Feb-1946, 67 y.o.   MRN: [id number]  HPI Patient in today c/o cough and congestion- Started about 7 days ago- tried OTC cold medicines with no relief.    Review of Systems  Constitutional: Positive for fatigue. Negative for fever.  HENT: Positive for congestion, rhinorrhea, sneezing and sinus pressure. Negative for ear pain.   Eyes: Negative.   Respiratory: Positive for cough. Negative for shortness of breath.   Cardiovascular: Negative.   Gastrointestinal: Negative.   Neurological: Negative.        Objective:   Physical Exam  Constitutional: He is oriented to person, place, and time. He appears well-developed and well-nourished.  HENT:  Right Ear: Hearing, tympanic membrane, external ear and ear canal normal.  Left Ear: Hearing, tympanic membrane, external ear and ear canal normal.  Nose: Mucosal edema and rhinorrhea present. Right sinus exhibits no maxillary sinus tenderness and no frontal sinus tenderness. Left sinus exhibits no maxillary sinus tenderness and no frontal sinus tenderness.  Mouth/Throat: Posterior oropharyngeal erythema present.  Cardiovascular: Regular rhythm and normal heart sounds.   Pulmonary/Chest: Effort normal and breath sounds normal. No respiratory distress. He has no wheezes. He has no rales.  Deep course cough  Abdominal: Soft.  Neurological: He is alert and oriented to person, place, and time.  Skin: Skin is warm.  Psychiatric: He has a normal mood and affect. His behavior is normal. Judgment and thought content normal.          Assessment & Plan:  1. Acute bronchitis 1. Take meds as prescribed 2. Use a cool mist humidifier especially during the winter months and when heat has  been humid. 3. Use saline nose sprays frequently 4. Saline irrigations of the nose can be very helpful if done frequently.  * 4X daily for 1 week*  * Use of a nettie pot can be helpful with this. Follow  directions with this* 5. Drink plenty of fluids 6. Keep thermostat turn down low 7.For any cough or congestion  Use plain Mucinex- regular strength or max strength is fine   * Children- consult with Pharmacist for dosing 8. For fever or aces or pains- take tylenol or ibuprofen appropriate for age and weight.  * for fevers greater than 101 orally you may alternate ibuprofen and tylenol every  3 hours.   - azithromycin (ZITHROMAX Z-PAK) 250 MG tablet; As directed  Dispense: 6 each; Refill: 0 - methylPREDNISolone acetate (DEPO-MEDROL) injection 80 mg; Inject 1 mL (80 mg total) into the muscle once.  Mary-Margaret Daphine Deutscher, FNP

## 2013-06-20 ENCOUNTER — Encounter: Payer: Self-pay | Admitting: *Deleted

## 2013-07-27 DIAGNOSIS — H25019 Cortical age-related cataract, unspecified eye: Secondary | ICD-10-CM | POA: Diagnosis not present

## 2013-07-27 DIAGNOSIS — H5319 Other subjective visual disturbances: Secondary | ICD-10-CM | POA: Diagnosis not present

## 2013-08-03 ENCOUNTER — Ambulatory Visit (INDEPENDENT_AMBULATORY_CARE_PROVIDER_SITE_OTHER): Payer: Medicare Other

## 2013-08-03 ENCOUNTER — Ambulatory Visit: Payer: Medicare Other | Admitting: Family Medicine

## 2013-08-03 DIAGNOSIS — Z23 Encounter for immunization: Secondary | ICD-10-CM | POA: Diagnosis not present

## 2013-08-05 ENCOUNTER — Encounter: Payer: Self-pay | Admitting: Family Medicine

## 2013-08-05 ENCOUNTER — Ambulatory Visit (INDEPENDENT_AMBULATORY_CARE_PROVIDER_SITE_OTHER): Payer: Medicare Other | Admitting: Family Medicine

## 2013-08-05 VITALS — BP 138/77 | HR 97 | Temp 97.3°F | Ht 69.0 in | Wt 216.8 lb

## 2013-08-05 DIAGNOSIS — A6000 Herpesviral infection of urogenital system, unspecified: Secondary | ICD-10-CM

## 2013-08-05 DIAGNOSIS — N400 Enlarged prostate without lower urinary tract symptoms: Secondary | ICD-10-CM | POA: Diagnosis not present

## 2013-08-05 DIAGNOSIS — E785 Hyperlipidemia, unspecified: Secondary | ICD-10-CM | POA: Diagnosis not present

## 2013-08-05 DIAGNOSIS — J309 Allergic rhinitis, unspecified: Secondary | ICD-10-CM

## 2013-08-05 DIAGNOSIS — L570 Actinic keratosis: Secondary | ICD-10-CM

## 2013-08-05 DIAGNOSIS — N529 Male erectile dysfunction, unspecified: Secondary | ICD-10-CM

## 2013-08-05 DIAGNOSIS — J302 Other seasonal allergic rhinitis: Secondary | ICD-10-CM

## 2013-08-05 DIAGNOSIS — D492 Neoplasm of unspecified behavior of bone, soft tissue, and skin: Secondary | ICD-10-CM | POA: Insufficient documentation

## 2013-08-05 MED ORDER — VALACYCLOVIR HCL 1 G PO TABS: 1000.0000 mg | ORAL_TABLET | Freq: Every day | ORAL | Status: DC

## 2013-08-05 MED ORDER — ATORVASTATIN CALCIUM 20 MG PO TABS: 20.0000 mg | ORAL_TABLET | Freq: Every day | ORAL | Status: DC

## 2013-08-05 MED ORDER — CETIRIZINE HCL 10 MG PO CAPS: 1.0000 | ORAL_CAPSULE | Freq: Every day | ORAL | Status: DC

## 2013-08-05 MED ORDER — FLUTICASONE PROPIONATE 50 MCG/ACT NA SUSP: 2.0000 | Freq: Every day | NASAL | Status: DC

## 2013-08-05 MED ORDER — DOXAZOSIN MESYLATE 2 MG PO TABS: 2.0000 mg | ORAL_TABLET | Freq: Every day | ORAL | Status: DC

## 2013-08-05 MED ORDER — NIACIN ER (ANTIHYPERLIPIDEMIC) 1000 MG PO TBCR: 1000.0000 mg | EXTENDED_RELEASE_TABLET | Freq: Every day | ORAL | Status: DC

## 2013-08-05 MED ORDER — FINASTERIDE 5 MG PO TABS: 5.0000 mg | ORAL_TABLET | Freq: Every day | ORAL | Status: DC

## 2013-08-05 NOTE — Patient Instructions (Signed)
      Dr Deniro Laymon's Recommendations  For nutrition information, I recommend books:  1).Eat to Live by Dr Joel Fuhrman. 2).Prevent and Reverse Heart Disease by Dr Caldwell Esselstyn. 3) Dr Neal Barnard's Book:  Program to Reverse Diabetes   

## 2013-08-05 NOTE — Progress Notes (Signed)
Patient ID: Samuel Wheeler, male   DOB: 10/25/1945, 67 y.o.   MRN: [id number] SUBJECTIVE: CC: Chief Complaint  Patient presents with  . Follow-up    6 month follow up  ck place rt auricle  . Medication Refill    refill all meds and nasonnex needs to be changed per insurance    HPI: Patient is here for follow up of hyperlipidemia: denies Headache;denies Chest Pain;denies weakness;denies Shortness of Breath and orthopnea;denies Visual changes;denies palpitations;denies cough;denies pedal edema;denies symptoms of TIA or stroke;deniesClaudication symptoms. admits to Compliance with medications; denies Problems with medications.  Has an area of thickness and scaly on the outer helix of the right ear.  Past Medical History  Diagnosis Date  . Hyperlipidemia   . Hypertension   . ED (erectile dysfunction)   . Actinic keratosis   . BPH (benign prostatic hyperplasia)    Past Surgical History  Procedure Laterality Date  . Cervical spine surgery    . Right shoulder     History   Social History  . Marital Status: Married    Spouse Name: N/A    Number of Children: N/A  . Years of Education: N/A   Occupational History  . Not on file.   Social History Main Topics  . Smoking status: Never Smoker   . Smokeless tobacco: Not on file  . Alcohol Use: No  . Drug Use: No  . Sexual Activity: Not on file   Other Topics Concern  . Not on file   Social History Narrative  . No narrative on file   Family History  Problem Relation Age of Onset  . Stroke Mother   . Stroke Father    No current outpatient prescriptions on file prior to visit.   No current facility-administered medications on file prior to visit.   No Known Allergies Immunization History  Administered Date(s) Administered  . Influenza,inj,Quad PF,36+ Mos 08/03/2013  . Pneumococcal Polysaccharide 07/06/2012  . Tdap 06/11/2009  . Zoster 01/27/2013   Prior to Admission medications   Medication Sig Start Date End Date  Taking? Authorizing Provider  amoxicillin (AMOXIL) 875 MG tablet Take 1 tablet (875 mg total) by mouth 2 (two) times daily. 03/12/13   Ileana Ladd, MD  atorvastatin (LIPITOR) 20 MG tablet Take 20 mg by mouth daily.    Historical Provider, MD  azithromycin (ZITHROMAX Z-PAK) 250 MG tablet As directed 03/18/13   Mary-Margaret Daphine Deutscher, FNP  benzonatate (TESSALON) 200 MG capsule Take 1 capsule (200 mg total) by mouth 3 (three) times daily as needed for cough. 03/15/13   Ileana Ladd, MD  Cetirizine HCl (ZYRTEC ALLERGY) 10 MG CAPS Take 1 capsule by mouth daily.    Historical Provider, MD  doxazosin (CARDURA) 2 MG tablet Take 2 mg by mouth at bedtime.    Historical Provider, MD  finasteride (PROSCAR) 5 MG tablet Take 1 tablet (5 mg total) by mouth daily. 03/03/13   Ileana Ladd, MD  mometasone (NASONEX) 50 MCG/ACT nasal spray Place 2 sprays into the nose daily.    Historical Provider, MD  naproxen sodium (ANAPROX) 220 MG tablet Take 220 mg by mouth 2 (two) times daily with a meal. Uses prn    Historical Provider, MD  niacin (NIASPAN) 1000 MG CR tablet Take 1,000 mg by mouth at bedtime.    Historical Provider, MD  valACYclovir (VALTREX) 1000 MG tablet Take 1,000 mg by mouth daily.    Historical Provider, MD     ROS: As above in  the HPI. All other systems are stable or negative.  OBJECTIVE: APPEARANCE:  Patient in no acute distress.The patient appeared well nourished and normally developed. Acyanotic. Waist: VITAL SIGNS:BP 138/77  Pulse 97  Temp(Src) 97.3 F (36.3 C) (Oral)  Ht 5\' 9"  (1.753 m)  Wt 216 lb 12.8 oz (98.34 kg)  BMI 32 kg/m2  WM Obese  SKIN: warm and  Dry without overt rashes, tattoos and scars. Small scaly area indented on the outer helix of the right ear. Suspicious for skin cancer.  HEAD and Neck: without JVD, Head and scalp: normal Eyes:No scleral icterus. Fundi normal, eye movements normal. Ears: Auricle normal, canal normal, Tympanic membranes normal, insufflation  normal. Nose: normal Throat: normal Neck & thyroid: normal  CHEST & LUNGS: Chest wall: normal Lungs: Clear  CVS: Reveals the PMI to be normally located. Regular rhythm, First and Second Heart sounds are normal,  absence of murmurs, rubs or gallops. Peripheral vasculature: Radial pulses: normal Dorsal pedis pulses: normal Posterior pulses: normal  ABDOMEN:  Appearance: obese Benign, no organomegaly, no masses, no Abdominal Aortic enlargement. No Guarding , no rebound. No Bruits. Bowel sounds: normal  RECTAL: N/A GU: N/A  EXTREMETIES: nonedematous.  MUSCULOSKELETAL:  Spine: normal Joints: intact  NEUROLOGIC: oriented to time,place and person; nonfocal. Strength is normal Sensory is normal Reflexes are normal Cranial Nerves are normal.  ASSESSMENT: HLD (hyperlipidemia) - Plan: niacin (NIASPAN) 1000 MG CR tablet, CMP14+EGFR, NMR, lipoprofile, atorvastatin (LIPITOR) 20 MG tablet, DISCONTINUED: atorvastatin (LIPITOR) 20 MG tablet  BPH (benign prostatic hyperplasia) - Plan: finasteride (PROSCAR) 5 MG tablet, doxazosin (CARDURA) 2 MG tablet  Erectile dysfunction  Genital herpes simplex - Plan: valACYclovir (VALTREX) 1000 MG tablet  Actinic keratosis - Plan: Ambulatory referral to Dermatology  Neoplasm of skin of ear - Plan: Ambulatory referral to Dermatology  Seasonal allergic rhinitis - Plan: fluticasone (FLONASE) 50 MCG/ACT nasal spray, Cetirizine HCl (ZYRTEC ALLERGY) 10 MG CAPS, DISCONTINUED: Cetirizine HCl (ZYRTEC ALLERGY) 10 MG CAPS  PLAN:  Orders Placed This Encounter  Procedures  . CMP14+EGFR  . NMR, lipoprofile  . Ambulatory referral to Dermatology    Referral Priority:  Routine    Referral Type:  Consultation    Referral Reason:  Specialty Services Required    Requested Specialty:  Dermatology    Number of Visits Requested:  1   Meds ordered this encounter  Medications  . fluticasone (FLONASE) 50 MCG/ACT nasal spray    Sig: Place 2 sprays into  the nose daily.    Dispense:  48 g    Refill:  2  . finasteride (PROSCAR) 5 MG tablet    Sig: Take 1 tablet (5 mg total) by mouth daily.    Dispense:  90 tablet    Refill:  3  . DISCONTD: atorvastatin (LIPITOR) 20 MG tablet    Sig: Take 1 tablet (20 mg total) by mouth daily.    Dispense:  90 tablet    Refill:  3  . DISCONTD: Cetirizine HCl (ZYRTEC ALLERGY) 10 MG CAPS    Sig: Take 1 capsule (10 mg total) by mouth daily.    Dispense:  90 capsule    Refill:  3  . doxazosin (CARDURA) 2 MG tablet    Sig: Take 1 tablet (2 mg total) by mouth at bedtime.    Dispense:  90 tablet    Refill:  3  . niacin (NIASPAN) 1000 MG CR tablet    Sig: Take 1 tablet (1,000 mg total) by mouth at bedtime.  Dispense:  90 tablet    Refill:  3  . valACYclovir (VALTREX) 1000 MG tablet    Sig: Take 1 tablet (1,000 mg total) by mouth daily.    Dispense:  90 tablet    Refill:  3  . atorvastatin (LIPITOR) 20 MG tablet    Sig: Take 1 tablet (20 mg total) by mouth daily.    Dispense:  90 tablet    Refill:  3  . Cetirizine HCl (ZYRTEC ALLERGY) 10 MG CAPS    Sig: Take 1 capsule (10 mg total) by mouth daily.    Dispense:  90 capsule    Refill:  3   Medications Discontinued During This Encounter  Medication Reason  . amoxicillin (AMOXIL) 875 MG tablet Completed Course  . azithromycin (ZITHROMAX Z-PAK) 250 MG tablet Completed Course  . benzonatate (TESSALON) 200 MG capsule Completed Course  . naproxen sodium (ANAPROX) 220 MG tablet Completed Course  . mometasone (NASONEX) 50 MCG/ACT nasal spray Formulary change  . finasteride (PROSCAR) 5 MG tablet Reorder  . atorvastatin (LIPITOR) 20 MG tablet Reorder  . Cetirizine HCl (ZYRTEC ALLERGY) 10 MG CAPS Reorder  . doxazosin (CARDURA) 2 MG tablet Reorder  . niacin (NIASPAN) 1000 MG CR tablet Reorder  . valACYclovir (VALTREX) 1000 MG tablet Reorder  . atorvastatin (LIPITOR) 20 MG tablet Reorder  . Cetirizine HCl (ZYRTEC ALLERGY) 10 MG CAPS Reorder       Dr  Woodroe Mode Recommendations  For nutrition information, I recommend books:  1).Eat to Live by Dr Monico Hoar. 2).Prevent and Reverse Heart Disease by Dr Suzzette Righter. 3) Dr Katherina Right Book:  Program to Reverse Diabetes  Return in about 6 months (around 02/02/2014) for Recheck medical problems.  Aquan Kope P. [place] Charon, M.D.

## 2013-08-07 LAB — CMP14+EGFR
ALT: 54 IU/L — ABNORMAL HIGH (ref 0–44)
AST: 28 IU/L (ref 0–40)
Albumin/Globulin Ratio: 1.7 (ref 1.1–2.5)
Albumin: 4.4 g/dL (ref 3.6–4.8)
Alkaline Phosphatase: 76 IU/L (ref 39–117)
BUN/Creatinine Ratio: 21 (ref 10–22)
BUN: 19 mg/dL (ref 8–27)
CO2: 27 mmol/L (ref 18–29)
Calcium: 9.2 mg/dL (ref 8.6–10.2)
Chloride: 104 mmol/L (ref 97–108)
Creatinine, Ser: 0.9 mg/dL (ref 0.76–1.27)
GFR calc Af Amer: 103 mL/min/{1.73_m2} (ref >59)
GFR calc non Af Amer: 89 mL/min/{1.73_m2} (ref >59)
Globulin, Total: 2.6 g/dL (ref 1.5–4.5)
Glucose: 109 mg/dL — ABNORMAL HIGH (ref 65–99)
Potassium: 4.7 mmol/L (ref 3.5–5.2)
Sodium: 144 mmol/L (ref 134–144)
Total Bilirubin: 0.6 mg/dL (ref 0.0–1.2)
Total Protein: 7 g/dL (ref 6.0–8.5)

## 2013-08-07 LAB — NMR, LIPOPROFILE
Cholesterol: 162 mg/dL (ref <200)
HDL Cholesterol by NMR: 43 mg/dL (ref >=40)
HDL Particle Number: 34.5 umol/L (ref >=30.5)
LDL Particle Number: 1476 nmol/L — ABNORMAL HIGH (ref <1000)
LDL Size: 20.2 nm — ABNORMAL LOW (ref >20.5)
LDLC SERPL CALC-MCNC: 81 mg/dL (ref <100)
LP-IR Score: 80 — ABNORMAL HIGH (ref <=45)
Small LDL Particle Number: 1016 nmol/L — ABNORMAL HIGH (ref <=527)
Triglycerides by NMR: 191 mg/dL — ABNORMAL HIGH (ref <150)

## 2013-08-15 ENCOUNTER — Telehealth: Payer: Self-pay | Admitting: Family Medicine

## 2013-08-31 DIAGNOSIS — H2510 Age-related nuclear cataract, unspecified eye: Secondary | ICD-10-CM | POA: Diagnosis not present

## 2013-09-23 DIAGNOSIS — H2510 Age-related nuclear cataract, unspecified eye: Secondary | ICD-10-CM | POA: Diagnosis not present

## 2013-09-23 DIAGNOSIS — H269 Unspecified cataract: Secondary | ICD-10-CM | POA: Diagnosis not present

## 2013-09-23 DIAGNOSIS — Z961 Presence of intraocular lens: Secondary | ICD-10-CM | POA: Diagnosis not present

## 2013-10-06 DIAGNOSIS — C801 Malignant (primary) neoplasm, unspecified: Secondary | ICD-10-CM

## 2013-10-06 DIAGNOSIS — H269 Unspecified cataract: Secondary | ICD-10-CM

## 2013-10-06 HISTORY — DX: Unspecified cataract: H26.9

## 2013-10-06 HISTORY — DX: Malignant (primary) neoplasm, unspecified: C80.1

## 2013-10-10 ENCOUNTER — Other Ambulatory Visit: Payer: Self-pay | Admitting: Family Medicine

## 2013-10-10 DIAGNOSIS — D485 Neoplasm of uncertain behavior of skin: Secondary | ICD-10-CM | POA: Diagnosis not present

## 2013-10-10 DIAGNOSIS — L819 Disorder of pigmentation, unspecified: Secondary | ICD-10-CM | POA: Diagnosis not present

## 2013-10-10 DIAGNOSIS — L570 Actinic keratosis: Secondary | ICD-10-CM | POA: Diagnosis not present

## 2013-10-10 DIAGNOSIS — D0420 Carcinoma in situ of skin of unspecified ear and external auricular canal: Secondary | ICD-10-CM | POA: Diagnosis not present

## 2013-10-20 DIAGNOSIS — L570 Actinic keratosis: Secondary | ICD-10-CM | POA: Diagnosis not present

## 2013-10-20 DIAGNOSIS — C44221 Squamous cell carcinoma of skin of unspecified ear and external auricular canal: Secondary | ICD-10-CM | POA: Diagnosis not present

## 2013-11-23 ENCOUNTER — Ambulatory Visit (INDEPENDENT_AMBULATORY_CARE_PROVIDER_SITE_OTHER): Payer: Medicare Other | Admitting: Family Medicine

## 2013-11-23 ENCOUNTER — Encounter: Payer: Self-pay | Admitting: Family Medicine

## 2013-11-23 VITALS — BP 132/80 | HR 61 | Temp 98.4°F | Ht 69.0 in | Wt 213.0 lb

## 2013-11-23 DIAGNOSIS — R05 Cough: Secondary | ICD-10-CM | POA: Diagnosis not present

## 2013-11-23 DIAGNOSIS — R059 Cough, unspecified: Secondary | ICD-10-CM

## 2013-11-23 LAB — POCT INFLUENZA A/B
Influenza A, POC: NEGATIVE
Influenza B, POC: NEGATIVE

## 2013-11-23 MED ORDER — AZITHROMYCIN 250 MG PO TABS
250.0000 mg | ORAL_TABLET | ORAL | Status: DC
Start: 2013-11-23 — End: 2013-11-30

## 2013-11-23 MED ORDER — OSELTAMIVIR PHOSPHATE 75 MG PO CAPS: 75.0000 mg | ORAL_CAPSULE | Freq: Two times a day (BID) | ORAL | Status: DC

## 2013-11-24 NOTE — Progress Notes (Signed)
   Subjective:    Patient ID: Samuel Wheeler, male    DOB: [birth year], 69 y.o.   MRN: [id number]  HPI This 68 y.o. male presents for evaluation of URI sx's, fatigue, aches and fever.  He has been exposed To flu and he is feeling achy.  He has been having a cough and sore throat.   Review of Systems    No chest pain, SOB, HA, dizziness, vision change, N/V, diarrhea, constipation, dysuria, urinary urgency or frequency, myalgias, arthralgias or rash.  Objective:   Physical Exam Vital signs noted  Well developed well nourished male.  HEENT - Head atraumatic Normocephalic                Eyes - PERRLA, Conjuctiva - clear Sclera- Clear EOMI                Ears - EAC's Wnl TM's Wnl Gross Hearing WNL                Nose - Nares patent                 Throat - oropharanx wnl Respiratory - Lungs CTA bilateral Cardiac - RRR S1 and S2 without murmur GI - Abdomen soft Nontender and bowel sounds active x 4 Extremities - No edema. Neuro - Grossly intact.   Results for orders placed in visit on 11/23/13  POCT INFLUENZA A/B      Result Value Ref Range   Influenza A, POC Negative     Influenza B, POC Negative        Assessment & Plan:  Cough - Plan: POCT Influenza A/B Push po fluids, rest, tylenol and motrin otc prn as directed for fever, arthralgias, and myalgias.  Follow up prn if sx's continue or persist. zpak as directed and tamiflu 75mg  one po bid x 5 days.  Lysbeth Penner FNP

## 2013-11-30 ENCOUNTER — Encounter: Payer: Self-pay | Admitting: General Practice

## 2013-11-30 ENCOUNTER — Ambulatory Visit (INDEPENDENT_AMBULATORY_CARE_PROVIDER_SITE_OTHER): Payer: Medicare Other | Admitting: General Practice

## 2013-11-30 VITALS — BP 125/77 | HR 64 | Temp 98.8°F | Ht 69.0 in | Wt 212.0 lb

## 2013-11-30 DIAGNOSIS — R05 Cough: Secondary | ICD-10-CM | POA: Diagnosis not present

## 2013-11-30 DIAGNOSIS — R059 Cough, unspecified: Secondary | ICD-10-CM | POA: Diagnosis not present

## 2013-11-30 DIAGNOSIS — J209 Acute bronchitis, unspecified: Secondary | ICD-10-CM | POA: Diagnosis not present

## 2013-11-30 DIAGNOSIS — R062 Wheezing: Secondary | ICD-10-CM | POA: Diagnosis not present

## 2013-11-30 MED ORDER — BENZONATATE 100 MG PO CAPS: 100.0000 mg | ORAL_CAPSULE | Freq: Three times a day (TID) | ORAL | Status: DC | PRN

## 2013-11-30 MED ORDER — PREDNISONE (PAK) 10 MG PO TABS: ORAL_TABLET | ORAL | Status: DC

## 2013-11-30 MED ORDER — ALBUTEROL SULFATE HFA 108 (90 BASE) MCG/ACT IN AERS: 2.0000 | INHALATION_SPRAY | Freq: Four times a day (QID) | RESPIRATORY_TRACT | Status: DC | PRN

## 2013-11-30 MED ORDER — METHYLPREDNISOLONE ACETATE 80 MG/ML IJ SUSP
80.0000 mg | Freq: Once | INTRAMUSCULAR | Status: AC
  Administered 2013-11-30: 80 mg via INTRAMUSCULAR

## 2013-11-30 MED ORDER — ALBUTEROL SULFATE (2.5 MG/3ML) 0.083% IN NEBU
2.5000 mg | INHALATION_SOLUTION | Freq: Once | RESPIRATORY_TRACT | Status: AC
  Administered 2013-11-30: 2.5 mg via RESPIRATORY_TRACT

## 2013-11-30 NOTE — Patient Instructions (Signed)

## 2013-11-30 NOTE — Progress Notes (Signed)
   Subjective:    Patient ID: MONTELL LEOPARD, male    DOB: [birth year], 68 y.o.   MRN: [id number]  Cough This is a new problem. The current episode started in the past 7 days. The problem has been gradually worsening. The problem occurs every few minutes. The cough is non-productive. Associated symptoms include chills, a fever, nasal congestion and wheezing. Pertinent negatives include no chest pain, postnasal drip, sore throat or shortness of breath. The symptoms are aggravated by lying down. He has tried OTC cough suppressant for the symptoms. The treatment provided moderate relief. There is no history of asthma, bronchitis or pneumonia.      Review of Systems  Constitutional: Positive for fever and chills.  HENT: Negative for postnasal drip and sore throat.   Respiratory: Positive for cough and wheezing. Negative for shortness of breath.   Cardiovascular: Negative for chest pain and palpitations.       Objective:   Physical Exam  Constitutional: He is oriented to person, place, and time. He appears well-developed and well-nourished.  Cardiovascular: Normal rate, regular rhythm and normal heart sounds.   Pulmonary/Chest: Effort normal. He has wheezes in the right upper field and the left upper field.  Neurological: He is alert and oriented to person, place, and time.  Skin: Skin is warm and dry.  Psychiatric: He has a normal mood and affect.          Assessment & Plan:  1. Acute bronchitis  - methylPREDNISolone acetate (DEPO-MEDROL) injection 80 mg; Inject 1 mL (80 mg total) into the muscle once. - predniSONE (STERAPRED UNI-PAK) 10 MG tablet; Start on 12/01/13  Dispense: 21 tablet; Refill: 0  2. Wheezing  - albuterol (PROVENTIL) (2.5 MG/3ML) 0.083% nebulizer solution 2.5 mg; Take 3 mLs (2.5 mg total) by nebulization once. -wheezing resolved after in office neb treatment  3. Cough  - benzonatate (TESSALON) 100 MG capsule; Take 1 capsule (100 mg total) by mouth 3 (three)  times daily as needed.  Dispense: 30 capsule; Refill: 0 -avoid irritants -patient information provided and discussed on bronchitis Patient verbalized understanding Erby Pian, FNP-C

## 2013-12-15 ENCOUNTER — Telehealth: Payer: Self-pay | Admitting: General Practice

## 2013-12-16 ENCOUNTER — Other Ambulatory Visit: Payer: Self-pay | Admitting: General Practice

## 2013-12-16 DIAGNOSIS — R059 Cough, unspecified: Secondary | ICD-10-CM

## 2013-12-16 DIAGNOSIS — R05 Cough: Secondary | ICD-10-CM

## 2013-12-16 MED ORDER — BENZONATATE 100 MG PO CAPS: 100.0000 mg | ORAL_CAPSULE | Freq: Three times a day (TID) | ORAL | Status: DC | PRN

## 2013-12-16 NOTE — Telephone Encounter (Signed)
Script sent to pharmacy. Should come in for chest xray is symptoms persist

## 2013-12-16 NOTE — Telephone Encounter (Signed)
Patient notified. He will return for CXR if symptoms persist.

## 2013-12-30 ENCOUNTER — Other Ambulatory Visit: Payer: Self-pay | Admitting: *Deleted

## 2013-12-30 DIAGNOSIS — J302 Other seasonal allergic rhinitis: Secondary | ICD-10-CM

## 2013-12-30 DIAGNOSIS — N400 Enlarged prostate without lower urinary tract symptoms: Secondary | ICD-10-CM

## 2013-12-30 DIAGNOSIS — E785 Hyperlipidemia, unspecified: Secondary | ICD-10-CM

## 2013-12-30 DIAGNOSIS — A6000 Herpesviral infection of urogenital system, unspecified: Secondary | ICD-10-CM

## 2013-12-30 MED ORDER — VALACYCLOVIR HCL 1 G PO TABS: 1000.0000 mg | ORAL_TABLET | Freq: Every day | ORAL | Status: DC

## 2013-12-30 MED ORDER — FINASTERIDE 5 MG PO TABS: 5.0000 mg | ORAL_TABLET | Freq: Every day | ORAL | Status: DC

## 2013-12-30 MED ORDER — DOXAZOSIN MESYLATE 2 MG PO TABS: 2.0000 mg | ORAL_TABLET | Freq: Every day | ORAL | Status: DC

## 2013-12-30 MED ORDER — FLUTICASONE PROPIONATE 50 MCG/ACT NA SUSP: 2.0000 | Freq: Every day | NASAL | Status: DC

## 2013-12-30 MED ORDER — NIACIN ER (ANTIHYPERLIPIDEMIC) 1000 MG PO TBCR: 1000.0000 mg | EXTENDED_RELEASE_TABLET | Freq: Every day | ORAL | Status: DC

## 2014-02-02 ENCOUNTER — Ambulatory Visit: Payer: Medicare Other | Admitting: Family Medicine

## 2014-04-19 DIAGNOSIS — L570 Actinic keratosis: Secondary | ICD-10-CM | POA: Diagnosis not present

## 2014-04-19 DIAGNOSIS — D485 Neoplasm of uncertain behavior of skin: Secondary | ICD-10-CM | POA: Diagnosis not present

## 2014-04-19 DIAGNOSIS — Z85828 Personal history of other malignant neoplasm of skin: Secondary | ICD-10-CM | POA: Diagnosis not present

## 2014-04-19 DIAGNOSIS — L820 Inflamed seborrheic keratosis: Secondary | ICD-10-CM | POA: Diagnosis not present

## 2014-07-10 ENCOUNTER — Ambulatory Visit (INDEPENDENT_AMBULATORY_CARE_PROVIDER_SITE_OTHER): Payer: Medicare Other

## 2014-07-10 DIAGNOSIS — Z23 Encounter for immunization: Secondary | ICD-10-CM | POA: Diagnosis not present

## 2014-07-26 DIAGNOSIS — H40033 Anatomical narrow angle, bilateral: Secondary | ICD-10-CM | POA: Diagnosis not present

## 2014-07-26 DIAGNOSIS — H2513 Age-related nuclear cataract, bilateral: Secondary | ICD-10-CM | POA: Diagnosis not present

## 2014-08-01 ENCOUNTER — Telehealth: Payer: Self-pay | Admitting: Family Medicine

## 2014-08-01 NOTE — Telephone Encounter (Signed)
Pt given appt w/miller 08/25/14 @ 5

## 2014-08-10 DIAGNOSIS — L03032 Cellulitis of left toe: Secondary | ICD-10-CM | POA: Diagnosis not present

## 2014-08-24 DIAGNOSIS — L03031 Cellulitis of right toe: Secondary | ICD-10-CM | POA: Diagnosis not present

## 2014-08-25 ENCOUNTER — Ambulatory Visit (INDEPENDENT_AMBULATORY_CARE_PROVIDER_SITE_OTHER): Payer: Medicare Other | Admitting: Family Medicine

## 2014-08-25 ENCOUNTER — Encounter: Payer: Self-pay | Admitting: Family Medicine

## 2014-08-25 ENCOUNTER — Other Ambulatory Visit: Payer: Self-pay | Admitting: *Deleted

## 2014-08-25 VITALS — BP 151/79 | HR 63 | Temp 97.1°F | Ht 69.0 in | Wt 216.8 lb

## 2014-08-25 DIAGNOSIS — R062 Wheezing: Secondary | ICD-10-CM | POA: Diagnosis not present

## 2014-08-25 DIAGNOSIS — N400 Enlarged prostate without lower urinary tract symptoms: Secondary | ICD-10-CM | POA: Diagnosis not present

## 2014-08-25 DIAGNOSIS — J302 Other seasonal allergic rhinitis: Secondary | ICD-10-CM | POA: Diagnosis not present

## 2014-08-25 DIAGNOSIS — A6000 Herpesviral infection of urogenital system, unspecified: Secondary | ICD-10-CM

## 2014-08-25 DIAGNOSIS — E785 Hyperlipidemia, unspecified: Secondary | ICD-10-CM | POA: Diagnosis not present

## 2014-08-25 MED ORDER — DOXAZOSIN MESYLATE 2 MG PO TABS: 2.0000 mg | ORAL_TABLET | Freq: Every day | ORAL | Status: DC

## 2014-08-25 MED ORDER — CETIRIZINE HCL 10 MG PO CAPS: 1.0000 | ORAL_CAPSULE | Freq: Every day | ORAL | Status: DC

## 2014-08-25 MED ORDER — CETIRIZINE HCL 10 MG PO CAPS
1.0000 | ORAL_CAPSULE | Freq: Every day | ORAL | Status: DC
Start: 2014-08-25 — End: 2015-02-13

## 2014-08-25 MED ORDER — ALBUTEROL SULFATE HFA 108 (90 BASE) MCG/ACT IN AERS: 2.0000 | INHALATION_SPRAY | Freq: Four times a day (QID) | RESPIRATORY_TRACT | Status: DC | PRN

## 2014-08-25 MED ORDER — FLUTICASONE PROPIONATE 50 MCG/ACT NA SUSP: 2.0000 | Freq: Every day | NASAL | Status: DC

## 2014-08-25 MED ORDER — ATORVASTATIN CALCIUM 20 MG PO TABS: 20.0000 mg | ORAL_TABLET | Freq: Every day | ORAL | Status: DC

## 2014-08-25 MED ORDER — VALACYCLOVIR HCL 1 G PO TABS: 1000.0000 mg | ORAL_TABLET | Freq: Every day | ORAL | Status: DC

## 2014-08-25 MED ORDER — NIACIN ER (ANTIHYPERLIPIDEMIC) 1000 MG PO TBCR: 1000.0000 mg | EXTENDED_RELEASE_TABLET | Freq: Every day | ORAL | Status: DC

## 2014-08-25 MED ORDER — FINASTERIDE 5 MG PO TABS: 5.0000 mg | ORAL_TABLET | Freq: Every day | ORAL | Status: DC

## 2014-08-25 NOTE — Progress Notes (Signed)
   Subjective:    Patient ID: Samuel Wheeler, male    DOB: 31-Mar-1946, 68 y.o.   MRN: [id number]  HPI  68 year old gentleman here to follow-up hyperlipidemia and enlarged prostate. Since he has gone on finasteride and doxazosin he does not have to get up as much as night to urinate. He does complain of some tiredness in the daytime and states that he probably has sleep apnea with snoring but is not interested in sleep study at this time Otherwise, he has some wheezing at nighttime for which he uses albuterol inhaler and takes several other medicines for allergies including antihistamine and steroid nasal spray. He also takes Valtrex on a regular preventive basis to prevent outbreaks.    Review of Systems  Constitutional: Negative.   HENT: Negative.   Eyes: Negative.   Respiratory: Negative.  Negative for shortness of breath.   Cardiovascular: Negative.  Negative for chest pain and leg swelling.  Gastrointestinal: Negative.   Genitourinary: Negative.   Musculoskeletal: Negative.   Skin: Negative.   Neurological: Negative.   Psychiatric/Behavioral: Negative.   All other systems reviewed and are negative.      Objective:   Physical Exam  Constitutional: He is oriented to person, place, and time. He appears well-developed and well-nourished.  HENT:  Head: Normocephalic.  Right Ear: External ear normal.  Left Ear: External ear normal.  Nose: Nose normal.  Mouth/Throat: Oropharynx is clear and moist.  Eyes: Conjunctivae and EOM are normal. Pupils are equal, round, and reactive to light.  Neck: Normal range of motion. Neck supple.  Cardiovascular: Normal rate, regular rhythm, normal heart sounds and intact distal pulses.   Pulmonary/Chest: Effort normal and breath sounds normal.  Abdominal: Soft. Bowel sounds are normal.  Genitourinary: Prostate normal.  Musculoskeletal: Normal range of motion.  Neurological: He is alert and oriented to person, place, and time.  Skin: Skin is warm  and dry.  Psychiatric: He has a normal mood and affect. His behavior is normal. Judgment and thought content normal.   BP 151/79 mmHg  Pulse 63  Temp(Src) 97.1 F (36.2 C) (Oral)  Ht $R'5\' 9"'tp$  (1.753 m)  Wt 216 lb 12.8 oz (98.34 kg)  BMI 32.00 kg/m2       Assessment & Plan:  1. HLD (hyperlipidemia)  - CMP14+EGFR - Lipid panel  2. BPH (benign prostatic hyperplasia)  - PSA, total and free  3. Wheezing probably related to perennial allergy  Discussed importance of weight maint and exercise  Wardell Honour MD

## 2014-08-25 NOTE — Addendum Note (Signed)
Addended by: Shelbie Ammons on: 08/25/2014 01:34 PM   Modules accepted: Orders

## 2014-08-26 LAB — CMP14+EGFR
ALK PHOS: 69 IU/L (ref 39–117)
ALT: 24 IU/L (ref 0–44)
AST: 20 IU/L (ref 0–40)
Albumin/Globulin Ratio: 1.7 (ref 1.1–2.5)
Albumin: 4.3 g/dL (ref 3.6–4.8)
BUN / CREAT RATIO: 18 (ref 10–22)
BUN: 16 mg/dL (ref 8–27)
CALCIUM: 9.1 mg/dL (ref 8.6–10.2)
CO2: 25 mmol/L (ref 18–29)
CREATININE: 0.91 mg/dL (ref 0.76–1.27)
Chloride: 103 mmol/L (ref 97–108)
GFR calc Af Amer: 100 mL/min/{1.73_m2} (ref >59)
GFR calc non Af Amer: 87 mL/min/{1.73_m2} (ref >59)
GLOBULIN, TOTAL: 2.5 g/dL (ref 1.5–4.5)
Glucose: 98 mg/dL (ref 65–99)
Potassium: 4.7 mmol/L (ref 3.5–5.2)
Sodium: 143 mmol/L (ref 134–144)
Total Bilirubin: 0.4 mg/dL (ref 0.0–1.2)
Total Protein: 6.8 g/dL (ref 6.0–8.5)

## 2014-08-26 LAB — PSA, TOTAL AND FREE
PSA, Free Pct: 13.8 %
PSA, Free: 0.11 ng/mL
PSA: 0.8 ng/mL (ref 0.0–4.0)

## 2014-08-26 LAB — LIPID PANEL
CHOL/HDL RATIO: 3 ratio (ref 0.0–5.0)
Cholesterol, Total: 134 mg/dL (ref 100–199)
HDL: 45 mg/dL (ref >39)
LDL Calculated: 71 mg/dL (ref 0–99)
TRIGLYCERIDES: 90 mg/dL (ref 0–149)
VLDL Cholesterol Cal: 18 mg/dL (ref 5–40)

## 2014-09-07 DIAGNOSIS — L03032 Cellulitis of left toe: Secondary | ICD-10-CM | POA: Diagnosis not present

## 2014-09-26 ENCOUNTER — Encounter: Payer: Self-pay | Admitting: Family Medicine

## 2014-09-26 ENCOUNTER — Ambulatory Visit (INDEPENDENT_AMBULATORY_CARE_PROVIDER_SITE_OTHER): Payer: Medicare Other | Admitting: Family Medicine

## 2014-09-26 VITALS — BP 133/84 | HR 97 | Temp 99.3°F | Ht 69.0 in | Wt 216.0 lb

## 2014-09-26 DIAGNOSIS — M791 Myalgia, unspecified site: Secondary | ICD-10-CM

## 2014-09-26 DIAGNOSIS — T148 Other injury of unspecified body region: Secondary | ICD-10-CM

## 2014-09-26 DIAGNOSIS — W57XXXA Bitten or stung by nonvenomous insect and other nonvenomous arthropods, initial encounter: Secondary | ICD-10-CM | POA: Diagnosis not present

## 2014-09-26 DIAGNOSIS — R109 Unspecified abdominal pain: Secondary | ICD-10-CM

## 2014-09-26 LAB — POCT URINALYSIS DIPSTICK
BILIRUBIN UA: NEGATIVE
Glucose, UA: NEGATIVE
Ketones, UA: NEGATIVE
LEUKOCYTES UA: NEGATIVE
Nitrite, UA: NEGATIVE
PH UA: 6
PROTEIN UA: NEGATIVE
SPEC GRAV UA: 1.015
Urobilinogen, UA: NEGATIVE

## 2014-09-26 LAB — POCT CBC
GRANULOCYTE PERCENT: 66 % (ref 37–80)
HEMATOCRIT: 44.7 % (ref 43.5–53.7)
HEMOGLOBIN: 14.2 g/dL (ref 14.1–18.1)
Lymph, poc: 2.9 (ref 0.6–3.4)
MCH, POC: 28.7 pg (ref 27–31.2)
MCHC: 31.8 g/dL (ref 31.8–35.4)
MCV: 90.4 fL (ref 80–97)
MPV: 7.3 fL (ref 0–99.8)
POC GRANULOCYTE: 7.2 — AB (ref 2–6.9)
POC LYMPH PERCENT: 26.5 %L (ref 10–50)
Platelet Count, POC: 234 10*3/uL (ref 142–424)
RBC: 4.9 M/uL (ref 4.69–6.13)
RDW, POC: 12.8 %
WBC: 10.9 10*3/uL — AB (ref 4.6–10.2)

## 2014-09-26 LAB — POCT UA - MICROSCOPIC ONLY
BACTERIA, U MICROSCOPIC: NEGATIVE
CRYSTALS, UR, HPF, POC: NEGATIVE
Casts, Ur, LPF, POC: NEGATIVE
WBC, UR, HPF, POC: NEGATIVE
Yeast, UA: NEGATIVE

## 2014-09-26 MED ORDER — CIPROFLOXACIN HCL 500 MG PO TABS: 500.0000 mg | ORAL_TABLET | Freq: Two times a day (BID) | ORAL | Status: DC

## 2014-09-26 MED ORDER — METRONIDAZOLE 500 MG PO TABS: 500.0000 mg | ORAL_TABLET | Freq: Three times a day (TID) | ORAL | Status: DC

## 2014-09-26 NOTE — Progress Notes (Signed)
Subjective:    Patient ID: Samuel Wheeler, male    DOB: 10-16-1945, 68 y.o.   MRN: [id number]  HPI Patient here today for left side pain that started up last night. He states he did have body aches for about 1 wk that is now resolved. The patient had body aches and left side pain last night and one week ago. The patient denies any injury. He denies any symptoms with passing his water other than frequency which he has had for a long time because of an enlarged prostate gland. He denies any GI symptoms. He denies any history of a kidney stone or any family history of kidney stones.        Patient Active Problem List   Diagnosis Date Noted  . 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 HLD (hyperlipidemia) 01/27/2013  . BPH (benign prostatic hyperplasia) 01/27/2013  . Erectile dysfunction 01/27/2013  . Genital herpes simplex 01/27/2013  . Actinic keratosis 01/27/2013  . Communicable disease 01/27/2013  . Need for zoster vaccination 01/27/2013   Outpatient Encounter Prescriptions as of 09/26/2014  Medication Sig  . albuterol (PROVENTIL HFA;VENTOLIN HFA) 108 (90 BASE) MCG/ACT inhaler Inhale 2 puffs into the lungs every 6 (six) hours as needed for wheezing or shortness of breath.  Marland Kitchen atorvastatin (LIPITOR) 20 MG tablet Take 1 tablet (20 mg total) by mouth daily.  . Cetirizine HCl (ZYRTEC ALLERGY) 10 MG CAPS Take 1 capsule (10 mg total) by mouth daily.  Marland Kitchen doxazosin (CARDURA) 2 MG tablet Take 1 tablet (2 mg total) by mouth at bedtime.  . finasteride (PROSCAR) 5 MG tablet Take 1 tablet (5 mg total) by mouth daily.  . fluticasone (FLONASE) 50 MCG/ACT nasal spray Place 2 sprays into both nostrils daily.  . niacin (NIASPAN) 1000 MG CR tablet Take 1 tablet (1,000 mg total) by mouth at bedtime.  . valACYclovir (VALTREX) 1000 MG tablet Take 1 tablet (1,000 mg total) by mouth daily.    Review of Systems  Constitutional: Negative.   HENT: Negative.   Eyes: Negative.   Respiratory: Negative.     Cardiovascular: Negative.   Gastrointestinal: Negative.        Lower left abdominal pain  Endocrine: Negative.   Genitourinary: Negative.   Musculoskeletal: Negative.   Skin: Negative.   Allergic/Immunologic: Negative.   Neurological: Negative.   Hematological: Negative.   Psychiatric/Behavioral: Negative.        Objective:   Physical Exam  Constitutional: He is oriented to person, place, and time. He appears well-developed and well-nourished.  HENT:  Head: Normocephalic and atraumatic.  Right Ear: External ear normal.  Left Ear: External ear normal.  Nose: Nose normal.  Mouth/Throat: Oropharynx is clear and moist. No oropharyngeal exudate.  Eyes: Conjunctivae and EOM are normal. Pupils are equal, round, and reactive to light. Right eye exhibits no discharge. Left eye exhibits no discharge. No scleral icterus.  Neck: Normal range of motion. Neck supple. No thyromegaly present.  Cardiovascular: Normal rate, regular rhythm and normal heart sounds.   No murmur heard. Pulmonary/Chest: Effort normal and breath sounds normal. No respiratory distress. He has no wheezes. He has no rales. He exhibits no tenderness.  Abdominal: Soft. Bowel sounds are normal. He exhibits no mass. There is tenderness. There is no rebound and no guarding.  There is slight left upper quadrant pain and left side pain with palpation just below the rib margins. There are no masses.  Musculoskeletal: Normal range of motion. He exhibits tenderness. He exhibits  no edema.  The pain in the left side appeared to be aggravated by being in a supine position. The tenderness was once again noted below the left lateral costal margin and the left upper flank area.  Lymphadenopathy:    He has no cervical adenopathy.  Neurological: He is alert and oriented to person, place, and time.  Skin: Skin is warm and dry. No rash noted.  Psychiatric: He has a normal mood and affect. His behavior is normal. Judgment and thought content  normal.  Nursing note and vitals reviewed.  BP 133/84 mmHg  Pulse 97  Temp(Src) 99.3 F (37.4 C) (Oral)  Ht '5\' 9"'  (1.753 m)  Wt 216 lb (97.977 kg)  BMI 31.88 kg/m2 Results for orders placed or performed in visit on 09/26/14  POCT CBC  Result Value Ref Range   WBC 10.9 (A) 4.6 - 10.2 K/uL   Lymph, poc 2.9 0.6 - 3.4   POC LYMPH PERCENT 26.5 10 - 50 %L   POC Granulocyte 7.2 (A) 2 - 6.9   Granulocyte percent 66.0 37 - 80 %G   RBC 4.9 4.69 - 6.13 M/uL   Hemoglobin 14.2 14.1 - 18.1 g/dL   HCT, POC 44.7 43.5 - 53.7 %   MCV 90.4 80 - 97 fL   MCH, POC 28.7 27 - 31.2 pg   MCHC 31.8 31.8 - 35.4 g/dL   RDW, POC 12.8 %   Platelet Count, POC 234.0 142 - 424 K/uL   MPV 7.3 0 - 99.8 fL  POCT UA - Microscopic Only  Result Value Ref Range   WBC, Ur, HPF, POC neg    RBC, urine, microscopic 1-5    Bacteria, U Microscopic neg    Mucus, UA moderate    Epithelial cells, urine per micros rare    Crystals, Ur, HPF, POC neg    Casts, Ur, LPF, POC neg    Yeast, UA neg   POCT urinalysis dipstick  Result Value Ref Range   Color, UA yellow    Clarity, UA clear    Glucose, UA neg    Bilirubin, UA neg    Ketones, UA neg    Spec Grav, UA 1.015    Blood, UA trace    pH, UA 6.0    Protein, UA neg    Urobilinogen, UA negative    Nitrite, UA neg    Leukocytes, UA Negative    The patient was made aware of the blood work and urine before he left the office. We will add additional tests for tick disease and kidney function       Assessment & Plan:  1. Left sided abdominal pain - POCT CBC - POCT UA - Microscopic Only - POCT urinalysis dipstick - CT Abdomen W Contrast; Future - Rocky mtn spotted fvr abs pnl(IgG+IgM) - Lyme Ab/Western Blot Reflex - BMP8+EGFR - metroNIDAZOLE (FLAGYL) 500 MG tablet; Take 1 tablet (500 mg total) by mouth 3 (three) times daily.  Dispense: 30 tablet; Refill: 0 - ciprofloxacin (CIPRO) 500 MG tablet; Take 1 tablet (500 mg total) by mouth 2 (two) times daily.   Dispense: 20 tablet; Refill: 0  2. Tick bite - Rocky mtn spotted fvr abs pnl(IgG+IgM) - Lyme Ab/Western Blot Reflex  3. Myalgia   Patient Instructions  We will arrange to get a CT scan of your abdomen We will add to the blood work some titers for Lyme disease and [facility] spotted fever We'll ask you to drink plenty of fluids and  avoid milk cheese ice cream and caffeine. We will start to antibiotics which could potentially help [place] with covering diverticulitis       Arrie Senate MD

## 2014-09-26 NOTE — Patient Instructions (Signed)
We will arrange to get a CT scan of your abdomen We will add to the blood work some titers for Lyme disease and [facility] spotted fever We'll ask you to drink plenty of fluids and avoid milk cheese ice cream and caffeine. We will start to antibiotics which could potentially help [place] with covering diverticulitis

## 2014-09-27 ENCOUNTER — Ambulatory Visit (HOSPITAL_COMMUNITY)
Admission: RE | Admit: 2014-09-27 | Discharge: 2014-09-27 | Disposition: A | Discharge status: Home / Self Care | Payer: Medicare Other | Source: Ambulatory Visit | Attending: Family Medicine | Admitting: Family Medicine

## 2014-09-27 ENCOUNTER — Other Ambulatory Visit: Payer: Self-pay

## 2014-09-27 ENCOUNTER — Encounter (HOSPITAL_COMMUNITY): Payer: Self-pay

## 2014-09-27 ENCOUNTER — Other Ambulatory Visit: Payer: Self-pay | Admitting: Family Medicine

## 2014-09-27 DIAGNOSIS — R109 Unspecified abdominal pain: Secondary | ICD-10-CM | POA: Insufficient documentation

## 2014-09-27 DIAGNOSIS — K5730 Diverticulosis of large intestine without perforation or abscess without bleeding: Secondary | ICD-10-CM | POA: Diagnosis not present

## 2014-09-27 DIAGNOSIS — R1084 Generalized abdominal pain: Secondary | ICD-10-CM

## 2014-09-27 MED ORDER — SODIUM CHLORIDE 0.9 % IJ SOLN
INTRAMUSCULAR | Status: AC
  Filled 2014-09-27: qty 15

## 2014-09-27 MED ORDER — SODIUM CHLORIDE 0.9 % IJ SOLN
INTRAMUSCULAR | Status: AC
  Filled 2014-09-27: qty 250

## 2014-09-27 MED ORDER — IOHEXOL 300 MG/ML  SOLN
100.0000 mL | Freq: Once | INTRAMUSCULAR | Status: AC | PRN
  Administered 2014-09-27: 100 mL via INTRAVENOUS

## 2014-09-29 LAB — BMP8+EGFR
BUN/Creatinine Ratio: 18 (ref 10–22)
BUN: 17 mg/dL (ref 8–27)
CO2: 25 mmol/L (ref 18–29)
Calcium: 9.1 mg/dL (ref 8.6–10.2)
Chloride: 99 mmol/L (ref 97–108)
Creatinine, Ser: 0.92 mg/dL (ref 0.76–1.27)
GFR calc non Af Amer: 86 mL/min/{1.73_m2} (ref >59)
GFR, EST AFRICAN AMERICAN: 99 mL/min/{1.73_m2} (ref >59)
GLUCOSE: 121 mg/dL — AB (ref 65–99)
Potassium: 4.2 mmol/L (ref 3.5–5.2)
Sodium: 141 mmol/L (ref 134–144)

## 2014-09-29 LAB — ROCKY MTN SPOTTED FVR ABS PNL(IGG+IGM)
RMSF IgG: POSITIVE — AB
RMSF IgM: 0.22 index (ref 0.00–0.89)

## 2014-09-29 LAB — LYME AB/WESTERN BLOT REFLEX
LYME DISEASE AB, QUANT, IGM: <0.8 index (ref 0.00–0.79)
Lyme IgG/IgM Ab: <0.91 {ISR} (ref 0.00–0.90)

## 2014-09-29 LAB — RMSF, IGG, IFA: RMSF, IGG, IFA: 1:256 {titer} — ABNORMAL HIGH

## 2014-09-29 MED ORDER — DOXYCYCLINE HYCLATE 100 MG PO TABS: 100.0000 mg | ORAL_TABLET | Freq: Two times a day (BID) | ORAL | Status: DC

## 2014-09-29 NOTE — Addendum Note (Signed)
Addended by: Chipper Herb on: 09/29/2014 10:11 PM   Modules accepted: Orders

## 2014-10-03 ENCOUNTER — Ambulatory Visit: Payer: Medicare Other | Admitting: Family Medicine

## 2014-10-06 HISTORY — PX: COLONOSCOPY: SHX174

## 2014-10-09 ENCOUNTER — Encounter: Payer: Self-pay | Admitting: Family Medicine

## 2014-10-09 ENCOUNTER — Ambulatory Visit (INDEPENDENT_AMBULATORY_CARE_PROVIDER_SITE_OTHER): Payer: Medicare Other | Admitting: Family Medicine

## 2014-10-09 VITALS — BP 132/72 | HR 73 | Temp 97.7°F | Ht 69.0 in | Wt 212.0 lb

## 2014-10-09 DIAGNOSIS — I499 Cardiac arrhythmia, unspecified: Secondary | ICD-10-CM | POA: Diagnosis not present

## 2014-10-09 DIAGNOSIS — T148 Other injury of unspecified body region: Secondary | ICD-10-CM

## 2014-10-09 DIAGNOSIS — Z1212 Encounter for screening for malignant neoplasm of rectum: Secondary | ICD-10-CM | POA: Diagnosis not present

## 2014-10-09 DIAGNOSIS — R109 Unspecified abdominal pain: Secondary | ICD-10-CM | POA: Diagnosis not present

## 2014-10-09 DIAGNOSIS — W57XXXA Bitten or stung by nonvenomous insect and other nonvenomous arthropods, initial encounter: Secondary | ICD-10-CM | POA: Diagnosis not present

## 2014-10-09 DIAGNOSIS — A770 Spotted fever due to Rickettsia rickettsii: Secondary | ICD-10-CM | POA: Diagnosis not present

## 2014-10-09 DIAGNOSIS — K5720 Diverticulitis of large intestine with perforation and abscess without bleeding: Secondary | ICD-10-CM

## 2014-10-09 LAB — POCT CBC
Granulocyte percent: 59.3 %G (ref 37–80)
HEMATOCRIT: 45.6 % (ref 43.5–53.7)
Hemoglobin: 14.8 g/dL (ref 14.1–18.1)
LYMPH, POC: 2.7 (ref 0.6–3.4)
MCH: 29 pg (ref 27–31.2)
MCHC: 32.5 g/dL (ref 31.8–35.4)
MCV: 89.2 fL (ref 80–97)
MPV: 6.7 fL (ref 0–99.8)
POC GRANULOCYTE: 4.7 (ref 2–6.9)
POC LYMPH PERCENT: 33.7 %L (ref 10–50)
Platelet Count, POC: 257 10*3/uL (ref 142–424)
RBC: 5.1 M/uL (ref 4.69–6.13)
RDW, POC: 13 %
WBC: 8 10*3/uL (ref 4.6–10.2)

## 2014-10-09 NOTE — Progress Notes (Signed)
Subjective:    Patient ID: Samuel Wheeler, male    DOB: June 11, 1946, 69 y.o.   MRN: [id number]  HPI Patient here today for follow up on recent left side abdominal pain and recent tick bite.       Patient Active Problem List   Diagnosis Date Noted  . 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 HLD (hyperlipidemia) 01/27/2013  . BPH (benign prostatic hyperplasia) 01/27/2013  . Erectile dysfunction 01/27/2013  . Genital herpes simplex 01/27/2013  . Actinic keratosis 01/27/2013  . Communicable disease 01/27/2013  . Need for zoster vaccination 01/27/2013   Outpatient Encounter Prescriptions as of 10/09/2014  Medication Sig  . albuterol (PROVENTIL HFA;VENTOLIN HFA) 108 (90 BASE) MCG/ACT inhaler Inhale 2 puffs into the lungs every 6 (six) hours as needed for wheezing or shortness of breath.  Marland Kitchen atorvastatin (LIPITOR) 20 MG tablet Take 1 tablet (20 mg total) by mouth daily.  . Cetirizine HCl (ZYRTEC ALLERGY) 10 MG CAPS Take 1 capsule (10 mg total) by mouth daily.  Marland Kitchen doxazosin (CARDURA) 2 MG tablet Take 1 tablet (2 mg total) by mouth at bedtime.  Marland Kitchen doxycycline (VIBRA-TABS) 100 MG tablet Take 1 tablet (100 mg total) by mouth 2 (two) times daily.  . finasteride (PROSCAR) 5 MG tablet Take 1 tablet (5 mg total) by mouth daily.  . fluticasone (FLONASE) 50 MCG/ACT nasal spray Place 2 sprays into both nostrils daily.  . niacin (NIASPAN) 1000 MG CR tablet Take 1 tablet (1,000 mg total) by mouth at bedtime.  . valACYclovir (VALTREX) 1000 MG tablet Take 1 tablet (1,000 mg total) by mouth daily.  . [DISCONTINUED] ciprofloxacin (CIPRO) 500 MG tablet Take 1 tablet (500 mg total) by mouth 2 (two) times daily.  . [DISCONTINUED] metroNIDAZOLE (FLAGYL) 500 MG tablet Take 1 tablet (500 mg total) by mouth 3 (three) times daily.    Review of Systems  Constitutional: Negative.   HENT: Negative.   Eyes: Negative.   Respiratory: Negative.   Cardiovascular: Negative.   Gastrointestinal: Negative.        Gi upset  - entire belly feels upset and he feels the need for BM more frequently  Endocrine: Negative.   Genitourinary: Negative.   Musculoskeletal: Negative.   Skin: Negative.   Allergic/Immunologic: Negative.   Neurological: Negative.   Hematological: Negative.   Psychiatric/Behavioral: Negative.        Objective:   Physical Exam  Constitutional: He is oriented to person, place, and time. He appears well-developed and well-nourished. No distress.  HENT:  Head: Normocephalic and atraumatic.  Mouth/Throat: No oropharyngeal exudate.  Eyes: Conjunctivae and EOM are normal. Pupils are equal, round, and reactive to light. Right eye exhibits no discharge. Left eye exhibits no discharge. No scleral icterus.  Neck: Normal range of motion. Neck supple. No thyromegaly present.  No ant cervical adenopathy  Cardiovascular: Normal rate and normal heart sounds.   No murmur heard. Heart irregular at 72/min  Pulmonary/Chest: Effort normal and breath sounds normal. No respiratory distress. He has no wheezes. He has no rales. He exhibits no tenderness.  Abdominal: Soft. Bowel sounds are normal. He exhibits no mass. There is no tenderness. There is no rebound and no guarding.  Slightly tender right side of abdomen, there is no inguinal adenopathy and area of tick bite appears well-healed  Musculoskeletal: Normal range of motion. He exhibits no edema or tenderness.  Lymphadenopathy:    He has no cervical adenopathy.  Neurological: He is alert and oriented to person, place,  and time. No cranial nerve deficit.  Skin: Skin is warm and dry. No rash noted. No erythema. No pallor.  Psychiatric: He has a normal mood and affect. His behavior is normal. Judgment and thought content normal.  Nursing note and vitals reviewed.  BP 132/72 mmHg  Pulse 73  Temp(Src) 97.7 F (36.5 C) (Oral)  Ht 5\' 9"  (1.753 m)  Wt 212 lb (96.163 kg)  BMI 31.29 kg/m2  EKG: EKG shows a rate of 65 with unifocal PVCs. The previous EKG  had normal sinus rhythm. Results for orders placed or performed in visit on 10/09/14  POCT CBC  Result Value Ref Range   WBC 8.0 4.6 - 10.2 K/uL   Lymph, poc 2.7 0.6 - 3.4   POC LYMPH PERCENT 33.7 10 - 50 %L   POC Granulocyte 4.7 2 - 6.9   Granulocyte percent 59.3 37 - 80 %G   RBC 5.1 4.69 - 6.13 M/uL   Hemoglobin 14.8 14.1 - 18.1 g/dL   HCT, POC 45.6 43.5 - 53.7 %   MCV 89.2 80 - 97 fL   MCH, POC 29.0 27 - 31.2 pg   MCHC 32.5 31.8 - 35.4 g/dL   RDW, POC 13.0 %   Platelet Count, POC 257.0 142 - 424 K/uL   MPV 6.7 0 - 99.8 fL   BMP and LFTs are pending We will also add a thyroid profile because of the PVCs. Hemoccult done in the office was negative on the stool specimen that was brought in from the outside He will be given another FOBT to return as well as a container for stool cultures and C. difficile       Assessment & Plan:  1. Abdominal discomfort - POCT CBC  2. Screening for malignant neoplasm of the rectum - Fecal occult blood, imunochemical  3. Irregular heart beat--PVCs per EKG - EKG 12-Lead  4. Tick bite  5. Diverticulitis of large intestine with perforation without bleeding  6. [facility] spotted fever  7. Loose bowel movements -Repeat turn stool specimen for FOBT and stool cultures and C. Difficile  Patient Instructions  For now continue the doxycycline Return the stool sample for cultures Avoid caffeine and milk and dairy products We will call you with lab work results as soon as available    Arrie Senate MD

## 2014-10-09 NOTE — Patient Instructions (Signed)
For now continue the doxycycline Return the stool sample for cultures Avoid caffeine and milk and dairy products We will call you with lab work results as soon as available

## 2014-10-10 ENCOUNTER — Telehealth: Payer: Self-pay | Admitting: Family Medicine

## 2014-10-10 ENCOUNTER — Other Ambulatory Visit: Payer: Medicare Other

## 2014-10-10 DIAGNOSIS — D225 Melanocytic nevi of trunk: Secondary | ICD-10-CM | POA: Diagnosis not present

## 2014-10-10 DIAGNOSIS — D485 Neoplasm of uncertain behavior of skin: Secondary | ICD-10-CM | POA: Diagnosis not present

## 2014-10-10 DIAGNOSIS — Z85828 Personal history of other malignant neoplasm of skin: Secondary | ICD-10-CM | POA: Diagnosis not present

## 2014-10-10 DIAGNOSIS — L570 Actinic keratosis: Secondary | ICD-10-CM | POA: Diagnosis not present

## 2014-10-10 DIAGNOSIS — R197 Diarrhea, unspecified: Secondary | ICD-10-CM | POA: Diagnosis not present

## 2014-10-10 LAB — THYROID PANEL WITH TSH
FREE THYROXINE INDEX: 2.7 (ref 1.2–4.9)
T3 UPTAKE RATIO: 34 % (ref 24–39)
T4 TOTAL: 7.8 ug/dL (ref 4.5–12.0)
TSH: 4.16 u[IU]/mL (ref 0.450–4.500)

## 2014-10-10 LAB — BMP8+EGFR
BUN/Creatinine Ratio: 16 (ref 10–22)
BUN: 13 mg/dL (ref 8–27)
CO2: 28 mmol/L (ref 18–29)
Calcium: 8.9 mg/dL (ref 8.6–10.2)
Chloride: 101 mmol/L (ref 97–108)
Creatinine, Ser: 0.83 mg/dL (ref 0.76–1.27)
GFR calc non Af Amer: 91 mL/min/{1.73_m2} (ref >59)
GFR, EST AFRICAN AMERICAN: 105 mL/min/{1.73_m2} (ref >59)
Glucose: 90 mg/dL (ref 65–99)
Potassium: 4.6 mmol/L (ref 3.5–5.2)
Sodium: 140 mmol/L (ref 134–144)

## 2014-10-10 LAB — HEPATIC FUNCTION PANEL
ALT: 29 IU/L (ref 0–44)
AST: 18 IU/L (ref 0–40)
Albumin: 4 g/dL (ref 3.6–4.8)
Alkaline Phosphatase: 58 IU/L (ref 39–117)
BILIRUBIN TOTAL: 0.3 mg/dL (ref 0.0–1.2)
Bilirubin, Direct: 0.11 mg/dL (ref 0.00–0.40)
Total Protein: 6.5 g/dL (ref 6.0–8.5)

## 2014-10-10 NOTE — Telephone Encounter (Signed)
-----   Message from Chipper Herb, MD sent at 10/10/2014  7:18 AM EST ----- The blood sugar is good. The creatinine the most important kidney function test is within normal limits. The electrolytes including potassium remained within normal limits. All liver function tests are within normal limits All thyroid function tests are within normal limits Return stool sample as directed and take antibiotic as directed----return to clinic as planned next week

## 2014-10-10 NOTE — Progress Notes (Signed)
Lab only 

## 2014-10-11 LAB — FECAL OCCULT BLOOD, IMMUNOCHEMICAL: FECAL OCCULT BLD: NEGATIVE

## 2014-10-11 LAB — CLOSTRIDIUM DIFFICILE BY PCR: CDIFFPCR: NEGATIVE

## 2014-10-12 LAB — FECAL OCCULT BLOOD, IMMUNOCHEMICAL: Fecal Occult Bld: NEGATIVE

## 2014-10-12 LAB — OVA AND PARASITE EXAMINATION

## 2014-10-12 NOTE — Telephone Encounter (Signed)
Aware of normal c-diff results.

## 2014-10-14 LAB — STOOL CULTURE: E coli, Shiga toxin Assay: NEGATIVE

## 2014-10-16 ENCOUNTER — Ambulatory Visit (INDEPENDENT_AMBULATORY_CARE_PROVIDER_SITE_OTHER): Payer: Medicare Other | Admitting: Family Medicine

## 2014-10-16 ENCOUNTER — Encounter: Payer: Self-pay | Admitting: Family Medicine

## 2014-10-16 VITALS — BP 127/77 | HR 69 | Temp 97.4°F | Ht 69.0 in | Wt 211.0 lb

## 2014-10-16 DIAGNOSIS — K5720 Diverticulitis of large intestine with perforation and abscess without bleeding: Secondary | ICD-10-CM

## 2014-10-16 DIAGNOSIS — Z823 Family history of stroke: Secondary | ICD-10-CM

## 2014-10-16 DIAGNOSIS — M5442 Lumbago with sciatica, left side: Secondary | ICD-10-CM | POA: Diagnosis not present

## 2014-10-16 DIAGNOSIS — Z8249 Family history of ischemic heart disease and other diseases of the circulatory system: Secondary | ICD-10-CM | POA: Diagnosis not present

## 2014-10-16 DIAGNOSIS — I493 Ventricular premature depolarization: Secondary | ICD-10-CM | POA: Diagnosis not present

## 2014-10-16 DIAGNOSIS — A770 Spotted fever due to Rickettsia rickettsii: Secondary | ICD-10-CM

## 2014-10-16 NOTE — Progress Notes (Signed)
Subjective:    Patient ID: Samuel Wheeler, male    DOB: [birth year], 69 y.o.   MRN: [id number]  HPI Patient here today for 1 week follow up on abdominal discomfort, tick bite, and PVC's. The patient is feeling well. He is having some low back pain at the left sacroiliac area. This is been more prominent since he has not been taking Aleve and has been taking Tylenol instead. He has gotten off the caffeine and his blood pressure is much improved today. After today he has 5 more days' worth of doxycycline to take and he will be finished with this antibiotic because of the positive test for [facility] spotted fever. He still has a slight headache and other than that he is feeling well. The patient was instructed on checking his pulse rate while he was in the office and is regularity or irregularity.       Patient Active Problem List   Diagnosis Date Noted  . 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 HLD (hyperlipidemia) 01/27/2013  . BPH (benign prostatic hyperplasia) 01/27/2013  . Erectile dysfunction 01/27/2013  . Genital herpes simplex 01/27/2013  . Actinic keratosis 01/27/2013  . Communicable disease 01/27/2013  . Need for zoster vaccination 01/27/2013   Outpatient Encounter Prescriptions as of 10/16/2014  Medication Sig  . albuterol (PROVENTIL HFA;VENTOLIN HFA) 108 (90 BASE) MCG/ACT inhaler Inhale 2 puffs into the lungs every 6 (six) hours as needed for wheezing or shortness of breath.  Marland Kitchen atorvastatin (LIPITOR) 20 MG tablet Take 1 tablet (20 mg total) by mouth daily.  . Cetirizine HCl (ZYRTEC ALLERGY) 10 MG CAPS Take 1 capsule (10 mg total) by mouth daily.  Marland Kitchen doxazosin (CARDURA) 2 MG tablet Take 1 tablet (2 mg total) by mouth at bedtime.  Marland Kitchen doxycycline (VIBRA-TABS) 100 MG tablet Take 1 tablet (100 mg total) by mouth 2 (two) times daily.  . finasteride (PROSCAR) 5 MG tablet Take 1 tablet (5 mg total) by mouth daily.  . fluticasone (FLONASE) 50 MCG/ACT nasal spray Place 2 sprays into  both nostrils daily.  . niacin (NIASPAN) 1000 MG CR tablet Take 1 tablet (1,000 mg total) by mouth at bedtime.  . valACYclovir (VALTREX) 1000 MG tablet Take 1 tablet (1,000 mg total) by mouth daily.    Review of Systems  Constitutional: Negative.   Eyes: Negative.   Respiratory: Negative.   Cardiovascular: Negative.   Gastrointestinal: Negative.        Much improved  Endocrine: Negative.   Genitourinary: Negative.   Musculoskeletal: Positive for myalgias and back pain (lower left side).  Skin: Negative.   Allergic/Immunologic: Negative.   Neurological: Positive for headaches.  Hematological: Negative.   Psychiatric/Behavioral: Negative.        Objective:   Physical Exam  Constitutional: He is oriented to person, place, and time. He appears well-developed and well-nourished. No distress.  The patient is pleasant and alert. He indicates that he is not aware that he has an irregular heartbeat. He denies chest pain or shortness of breath.  HENT:  Head: Normocephalic and atraumatic.  The patient wears hearing aids bilaterally.  Eyes: Conjunctivae and EOM are normal. Pupils are equal, round, and reactive to light. Right eye exhibits no discharge. Left eye exhibits no discharge. No scleral icterus.  Neck: Normal range of motion. Neck supple. No tracheal deviation present. No thyromegaly present.  There are no carotid bruits or anterior cervical adenopathy.  Cardiovascular: Normal rate and normal heart sounds.  Exam reveals no  gallop and no friction rub.   No murmur heard. The patient still has PVCs. His rate is about 50 to 60/m.  Pulmonary/Chest: Effort normal and breath sounds normal. No respiratory distress. He has no wheezes. He has no rales. He exhibits no tenderness.  Abdominal: Soft. Bowel sounds are normal. He exhibits no mass. There is no tenderness. There is no rebound and no guarding.  There is absolutely no abdominal tenderness.  Musculoskeletal: Normal range of motion. He  exhibits no edema.  Lymphadenopathy:    He has no cervical adenopathy.  Neurological: He is alert and oriented to person, place, and time.  Skin: Skin is warm and dry. No rash noted. No erythema. No pallor.  Psychiatric: He has a normal mood and affect. His behavior is normal. Judgment and thought content normal.  Nursing note and vitals reviewed.  BP 127/77 mmHg  Pulse 69  Temp(Src) 97.4 F (36.3 C) (Oral)  Ht 5\' 9"  (1.753 m)  Wt 211 lb (95.709 kg)  BMI 31.15 kg/m2        Assessment & Plan:  1. PVC's (premature ventricular contractions) -Continue to leave off caffeine - Ambulatory referral to Cardiology  2. Family history of stroke - Ambulatory referral to Cardiology  3. Family history of heart disease - Ambulatory referral to Cardiology  4. [facility] spotted fever -Continue to finish and complete doxycycline.  5. Diverticulitis of large intestine with perforation without bleeding -This is improved and at this time and no further workup will be done  6. Low back pain -If problems continue, LS spine films  Patient Instructions  We will arrange for you to have an appointment to see the cardiologist, Dr. Percival Spanish, because of the PVCs and positive family history for stroke and heart disease. Continue to leave off the caffeine Finish the doxycycline After you finish the doxycycline you may take some Aleve once or twice daily for the low back pain.  If the low back pain continues please call my nurse and arrange for her to get an LS spine film    Arrie Senate MD

## 2014-10-16 NOTE — Patient Instructions (Signed)
We will arrange for you to have an appointment to see the cardiologist, Dr. Percival Spanish, because of the PVCs and positive family history for stroke and heart disease. Continue to leave off the caffeine Finish the doxycycline After you finish the doxycycline you may take some Aleve once or twice daily for the low back pain.  If the low back pain continues please call my nurse and arrange for her to get an LS spine film

## 2014-10-31 DIAGNOSIS — D485 Neoplasm of uncertain behavior of skin: Secondary | ICD-10-CM | POA: Diagnosis not present

## 2014-10-31 DIAGNOSIS — L905 Scar conditions and fibrosis of skin: Secondary | ICD-10-CM | POA: Diagnosis not present

## 2014-11-01 ENCOUNTER — Ambulatory Visit (INDEPENDENT_AMBULATORY_CARE_PROVIDER_SITE_OTHER): Payer: Medicare Other | Admitting: Cardiology

## 2014-11-01 ENCOUNTER — Encounter: Payer: Self-pay | Admitting: Cardiology

## 2014-11-01 VITALS — BP 140/90 | HR 64 | Ht 70.0 in | Wt 214.0 lb

## 2014-11-01 DIAGNOSIS — I493 Ventricular premature depolarization: Secondary | ICD-10-CM

## 2014-11-01 NOTE — Progress Notes (Signed)
HPI The patient presents as a new patient for evaluation of premature ventricular contractions. He has no past cardiac history though there is a family history of early heart disease. He was noted during a recent evaluation of PVCs. He didn't notice disease. I did review these on his EKG. He had lab work after this which demonstrated normal potassium, renal function and TSH. He says he is well.  The patient denies any new symptoms such as chest discomfort, neck or arm discomfort. There has been no new shortness of breath, PND or orthopnea. There have been no reported palpitations, presyncope or syncope.  He reports that he is quite active.  He says he might have had a stress test years ago but nothing recent.   No Known Allergies  Current Outpatient Prescriptions  Medication Sig Dispense Refill  . albuterol (PROVENTIL HFA;VENTOLIN HFA) 108 (90 BASE) MCG/ACT inhaler Inhale 2 puffs into the lungs every 6 (six) hours as needed for wheezing or shortness of breath. 1 Inhaler 3  . atorvastatin (LIPITOR) 20 MG tablet Take 1 tablet (20 mg total) by mouth daily. 90 tablet 3  . Cetirizine HCl (ZYRTEC ALLERGY) 10 MG CAPS Take 1 capsule (10 mg total) by mouth daily. 90 capsule 3  . doxazosin (CARDURA) 2 MG tablet Take 1 tablet (2 mg total) by mouth at bedtime. 90 tablet 0  . finasteride (PROSCAR) 5 MG tablet Take 1 tablet (5 mg total) by mouth daily. 90 tablet 0  . fluticasone (FLONASE) 50 MCG/ACT nasal spray Place 2 sprays into both nostrils daily. 48 g 1  . niacin (NIASPAN) 1000 MG CR tablet Take 1 tablet (1,000 mg total) by mouth at bedtime. 90 tablet 0  . valACYclovir (VALTREX) 1000 MG tablet Take 1 tablet (1,000 mg total) by mouth daily. 90 tablet 0   No current facility-administered medications for this visit.    Past Medical History  Diagnosis Date  . Hyperlipidemia   . Hypertension   . ED (erectile dysfunction)   . Actinic keratosis   . BPH (benign prostatic hyperplasia)     Past  Surgical History  Procedure Laterality Date  . Cervical spine surgery    . Right shoulder      Family History  Problem Relation Age of Onset  . Stroke Mother 22  . Stroke Father 22  . Heart disease Sister     "Mild heart attack", pacemaker  . Stroke Brother 3    History   Social History  . Marital Status: Married    Spouse Name: N/A    Number of Children: 6\0  . Years of Education: N/A   Occupational History  . Not on file.   Social History Main Topics  . Smoking status: Never Smoker   . Smokeless tobacco: Not on file  . Alcohol Use: No  . Drug Use: No  . Sexual Activity: Not on file   Other Topics Concern  . Not on file   Social History Narrative   Lives with wife.  His sister in law just moved in.  He has six step children.     ROS:  Positive for joint pains, rhinitis, headaches, tinnitus.  PHYSICAL EXAM BP 140/90 mmHg  Pulse 64  Ht 5\' 10"  (1.778 m)  Wt 214 lb (97.07 kg)  BMI 30.71 kg/m2  GENERAL:  Well appearing HEENT:  Pupils equal round and reactive, fundi not visualized, oral mucosa unremarkable NECK:  No jugular venous distention, waveform within normal limits, carotid upstroke brisk and  symmetric, no bruits, no thyromegaly LYMPHATICS:  No cervical, inguinal adenopathy LUNGS:  Clear to auscultation bilaterally BACK:  No CVA tenderness CHEST:  Unremarkable HEART:  PMI not displaced or sustained,S1 and S2 within normal limits, no S3, no S4, no clicks, no rubs, no murmurs ABD:  Flat, positive bowel sounds normal in frequency in pitch, no bruits, no rebound, no guarding, no midline pulsatile mass, no hepatomegaly, no splenomegaly EXT:  2 plus pulses throughout, no edema, no cyanosis no clubbing SKIN:  No rashes no nodules NEURO:  Cranial nerves II through XII grossly intact, motor grossly intact throughout PSYCH:  Cognitively intact, oriented to person place and time   EKG:  Sinus rhythm, rate 64, axis within normal limits, intervals within normal  limits, premature ventricular contractions in a trigeminal pattern, nonspecific lateral T-wave flattening.  10/09/14  ASSESSMENT AND PLAN   PVCs:  He's having no symptoms related to these. No change in therapy is indicated.  DYSLIPIDEMIA:   His LDL last fall was 70 with an HDL of 45. He will continue the meds as listed.   HTN:  His blood pressure is well controlled. He will continue the meds as listed.  RISK REDUCTION:   I will bring the patient back for a POET (Plain Old Exercise Test). This will allow me to screen for obstructive coronary disease, risk stratify and very importantly provide a prescription for exercise.

## 2014-11-01 NOTE — Patient Instructions (Signed)
The current medical regimen is effective;  continue present plan and medications.  Your physician has requested that you have an exercise tolerance test. For further information please visit [email or web address]. Please also follow instruction sheet, as given.  Further follow up will be based on these results 

## 2014-11-05 ENCOUNTER — Other Ambulatory Visit: Payer: Self-pay | Admitting: Family Medicine

## 2014-11-15 ENCOUNTER — Institutional Professional Consult (permissible substitution): Payer: Medicare Other | Admitting: Cardiology

## 2014-11-30 ENCOUNTER — Other Ambulatory Visit: Payer: Self-pay | Admitting: Family Medicine

## 2014-12-08 ENCOUNTER — Telehealth (HOSPITAL_COMMUNITY): Payer: Self-pay

## 2014-12-08 NOTE — Telephone Encounter (Signed)
Encounter complete. 

## 2014-12-12 ENCOUNTER — Telehealth (HOSPITAL_COMMUNITY): Payer: Self-pay

## 2014-12-12 NOTE — Telephone Encounter (Signed)
Encounter complete. 

## 2014-12-13 ENCOUNTER — Ambulatory Visit (HOSPITAL_COMMUNITY)
Admission: RE | Admit: 2014-12-13 | Discharge: 2014-12-13 | Disposition: A | Discharge status: Home / Self Care | Payer: Medicare Other | Source: Ambulatory Visit | Attending: Cardiology | Admitting: Cardiology

## 2014-12-13 DIAGNOSIS — I10 Essential (primary) hypertension: Secondary | ICD-10-CM | POA: Insufficient documentation

## 2014-12-13 DIAGNOSIS — E785 Hyperlipidemia, unspecified: Secondary | ICD-10-CM | POA: Insufficient documentation

## 2014-12-13 DIAGNOSIS — I493 Ventricular premature depolarization: Secondary | ICD-10-CM | POA: Diagnosis not present

## 2014-12-13 NOTE — Procedures (Signed)
Exercise Treadmill Test  Pre-Exercise Testing Evaluation Rhythm: normal sinus  Rate: 76     Test  Exercise Tolerance Test Ordering MD: Marijo File, MD    Unique Test No: 1  Treadmill:  1  Indication for ETT: Palpitations  Contraindication to ETT: No   Stress Modality: exercise - treadmill  Cardiac Imaging Performed: non   Protocol: standard Bruce - maximal  Max BP:  222/73  Max MPHR (bpm):  153 85% MPR (bpm):  130  MPHR obtained (bpm):  148 % MPHR obtained:  96  Reached 85% MPHR (min:sec):  7  Total Exercise Time (min-sec):  9   Workload in METS:  10.2 Borg Scale: 15  Reason ETT Terminated:  dyspnea    ST Segment Analysis At Rest: normal ST segments - no evidence of significant ST depression With Exercise: no evidence of significant ST depression  Other Information Arrhythmia:  Yes - rare PVCs with mid level exertion - decrease with peak exercise & return during recovery with increased frequency. Angina during ETT:  absent (0);  Quality of ETT:  diagnostic  ETT Interpretation:  normal - no evidence of ischemia by ST analysis  Comments: Duke TM Score = 9; 9 min with NO ST changes & NO Angina. Minimal PVCs noted during exercise, but increased frequency during recover. Normal Exercise tolerance with normal recovery period.  Recommendations: N/A   Leonie Man, M.D., M.S. Interventional Cardiologist   Pager # [phone]

## 2015-01-02 ENCOUNTER — Encounter: Payer: Self-pay | Admitting: *Deleted

## 2015-01-02 ENCOUNTER — Ambulatory Visit (INDEPENDENT_AMBULATORY_CARE_PROVIDER_SITE_OTHER): Payer: Medicare Other | Admitting: *Deleted

## 2015-01-02 VITALS — BP 155/82 | HR 61 | Resp 20 | Ht 69.5 in | Wt 215.0 lb

## 2015-01-02 DIAGNOSIS — Z0000 Encounter for general adult medical examination without abnormal findings: Secondary | ICD-10-CM

## 2015-01-02 NOTE — Progress Notes (Signed)
Subjective:   Samuel Wheeler is a 69 y.o. male who presents for an Initial Medicare Annual Wellness Visit.  Review of Systems   Cardiac Risk Factors include: advanced age (>73men, >29 women);dyslipidemia;hypertension;male gender    Objective:    Today's Vitals   01/02/15 1442  BP: 155/82  Pulse: 61  Resp: 20  Height: 5' 9.5" (1.765 m)  Weight: 215 lb (97.523 kg)  PainSc: 0-No pain    Current Medications (verified) Outpatient Encounter Prescriptions as of 01/02/2015  Medication Sig  . albuterol (PROVENTIL HFA;VENTOLIN HFA) 108 (90 BASE) MCG/ACT inhaler Inhale 2 puffs into the lungs every 6 (six) hours as needed for wheezing or shortness of breath.  Marland Kitchen atorvastatin (LIPITOR) 20 MG tablet Take 1 tablet (20 mg total) by mouth daily.  . Cetirizine HCl (ZYRTEC ALLERGY) 10 MG CAPS Take 1 capsule (10 mg total) by mouth daily.  Marland Kitchen doxazosin (CARDURA) 2 MG tablet TAKE 1 TABLET AT BEDTIME  . finasteride (PROSCAR) 5 MG tablet TAKE 1 TABLET DAILY  . fluticasone (FLONASE) 50 MCG/ACT nasal spray Place 2 sprays into both nostrils daily.  . Naproxen Sodium 220 MG CAPS Take 220 mg by mouth as needed.  Marland Kitchen NIASPAN 1000 MG CR tablet TAKE 1 TABLET AT BEDTIME  . valACYclovir (VALTREX) 1000 MG tablet Take 1 tablet (1,000 mg total) by mouth daily.    Allergies (verified) Review of patient's allergies indicates no known allergies.   History: Past Medical History  Diagnosis Date  . Hyperlipidemia   . Hypertension   . ED (erectile dysfunction)   . Actinic keratosis   . BPH (benign prostatic hyperplasia)   . Allergy     Year Round   . Cancer 10/2013    right ear skin   . Cataract 2015    Right Eye    Past Surgical History  Procedure Laterality Date  . Cervical spine surgery    . Right shoulder     Family History  Problem Relation Age of Onset  . Stroke Mother 54  . Stroke Father 75  . Diabetes Father   . COPD Father   . Heart disease Sister     "Mild heart attack", pacemaker  .  Stroke Brother 66  . Syncope episode Brother   . Cancer Sister 73    Colon Cancer  . Cancer Sister     Skin Cancers  . Cancer Sister     Melanoma  . Cancer Sister     Skin Cancer     Activities of Daily Living In your present state of health, do you have any difficulty performing the following activities: 01/02/2015  Is the patient deaf or have difficulty hearing? Y  Hearing N  Vision N  Difficulty concentrating or making decisions N  Walking or climbing stairs? N  Doing errands, shopping? N  Preparing Food and eating ? N  Using the Toilet? N  In the past six months, have you accidently leaked urine? N  Do you have problems with loss of bowel control? N  Managing your Medications? N  Managing your Finances? N  Housekeeping or managing your Housekeeping? N    Immunizations and Health Maintenance Immunization History  Administered Date(s) Administered  . Influenza,inj,Quad PF,36+ Mos 08/03/2013, 07/10/2014  . Pneumococcal Conjugate-13 07/20/2014  . Pneumococcal Polysaccharide-23 07/06/2012  . Tdap 06/11/2009  . Zoster 01/27/2013   There are no preventive care reminders to display for this patient.  Patient Care Team: Chipper Herb, MD as PCP -  General (Family Medicine) Minus Breeding, MD as Consulting Physician (Cardiology) Ladene Artist, MD as Consulting Physician (Gastroenterology) Sandford Craze, MD as Referring Physician (Unknown Physician Specialty)  Indicate any recent Medical Services you may have received from other than [facility] providers in the past year (date may be approximate). Patient is retired from [employer] and does go to the [employer] in [place], [place]    Assessment:   This is a routine wellness examination for Samuel Wheeler.   Hearing/Vision screen Patient has bilateral hearing aides that were put in by the [employer] in [place] He seen Samuel Wheeler @ [facility] for his Cataracts in the past and goes to Samuel Wheeler @ [place] [facility] in [place].  I called for results for his chart.  Dietary issues and exercise activities discussed: Current Exercise Habits:: The patient does not participate in regular exercise at present (Works outside daily cutts wood yard work )  Goals    None     Depression Screen PHQ 2/9 Scores 01/02/2015 10/09/2014 08/25/2014  PHQ - 2 Score 0 0 0    Fall Risk Fall Risk  01/02/2015 10/09/2014 08/25/2014  Falls in the past year? No No No    Cognitive Function: MMSE - Mini Mental State Exam 01/02/2015  Orientation to time 5  Orientation to Place 5  Registration 3  Attention/ Calculation 5  Recall 3  Language- name 2 objects 2  Language- repeat 1  Language- follow 3 step command 3  Language- read & follow direction 1  Write a sentence 1  Copy design 1  Total score 30    Screening Tests Health Maintenance  Topic Date Due  . INFLUENZA VACCINE  05/07/2015  . COLONOSCOPY  06/23/2018  . TETANUS/TDAP  06/12/2019  . ZOSTAVAX  Completed  . PNA vac Low Risk Adult  Completed        Plan:  Patient has an appointment with Samuel Wheeler 01/15/2015 @ 2:15. During the course of the visit Alireza was educated and counseled about the following appropriate screening and preventive services:   Vaccines to include Pneumoccal, Influenza, Tdap, Zostavax: All vaccines are UTD  Colorectal cancer screening : UTD 06/23/2008 colonoscopy and FOBT UTD negative 10/10/2014  Diabetes screening :NA  Nutrition counseling NA  Prostate cancer screening Appt with Samuel Wheeler 01/15/15  Smoking cessation counseling : NA  Patient Instructions (the written plan) were given to the patient.   Joneen Boers, RN   01/02/2015       I have reviewed and agree with the above AWV documentation.  Claretta Fraise, M.D.

## 2015-01-02 NOTE — Patient Instructions (Signed)
Preventive Care for Adults A healthy lifestyle and preventive care can promote health and wellness. Preventive health guidelines for men include the following key practices:  A routine yearly physical is a good way to check with your health care provider about your health and preventative screening. It is a chance to share any concerns and updates on your health and to receive a thorough exam.  Visit your dentist for a routine exam and preventative care every 6 months. Brush your teeth twice a day and floss once a day. Good oral hygiene prevents tooth decay and gum disease.  The frequency of eye exams is based on your age, health, family medical history, use of contact lenses, and other factors. Follow your health care provider's recommendations for frequency of eye exams.  Eat a healthy diet. Foods such as vegetables, fruits, whole grains, low-fat dairy products, and lean protein foods contain the nutrients you need without too many calories. Decrease your intake of foods high in solid fats, added sugars, and salt. Eat the right amount of calories for you.Get information about a proper diet from your health care provider, if necessary.  Regular physical exercise is one of the most important things you can do for your health. Most adults should get at least 150 minutes of moderate-intensity exercise (any activity that increases your heart rate and causes you to sweat) each week. In addition, most adults need muscle-strengthening exercises on 2 or more days a week.  Maintain a healthy weight. The body mass index (BMI) is a screening tool to identify possible weight problems. It provides an estimate of body fat based on height and weight. Your health care provider can find your BMI and can help you achieve or maintain a healthy weight.For adults 20 years and older:  A BMI below 18.5 is considered underweight.  A BMI of 18.5 to 24.9 is normal.  A BMI of 25 to 29.9 is considered overweight.  A BMI  of 30 and above is considered obese.  Maintain normal blood lipids and cholesterol levels by exercising and minimizing your intake of saturated fat. Eat a balanced diet with plenty of fruit and vegetables. Blood tests for lipids and cholesterol should begin at age 50 and be repeated every 5 years. If your lipid or cholesterol levels are high, you are over 50, or you are at high risk for heart disease, you may need your cholesterol levels checked more frequently.Ongoing high lipid and cholesterol levels should be treated with medicines if diet and exercise are not working.  If you smoke, find out from your health care provider how to quit. If you do not use tobacco, do not start.  Lung cancer screening is recommended for adults aged 73-80 years who are at high risk for developing lung cancer because of a history of smoking. A yearly low-dose CT scan of the lungs is recommended for people who have at least a 30-pack-year history of smoking and are a current smoker or have quit within the past 15 years. A pack year of smoking is smoking an average of 1 pack of cigarettes a day for 1 year (for example: 1 pack a day for 30 years or 2 packs a day for 15 years). Yearly screening should continue until the smoker has stopped smoking for at least 15 years. Yearly screening should be stopped for people who develop a health problem that would prevent them from having lung cancer treatment.  If you choose to drink alcohol, do not have more than  2 drinks per day. One drink is considered to be 12 ounces (355 mL) of beer, 5 ounces (148 mL) of wine, or 1.5 ounces (44 mL) of liquor.  Avoid use of street drugs. Do not share needles with anyone. Ask for help if you need support or instructions about stopping the use of drugs.  High blood pressure causes heart disease and increases the risk of stroke. Your blood pressure should be checked at least every 1-2 years. Ongoing high blood pressure should be treated with  medicines, if weight loss and exercise are not effective.  If you are 45-79 years old, ask your health care provider if you should take aspirin to prevent heart disease.  Diabetes screening involves taking a blood sample to check your fasting blood sugar level. This should be done once every 3 years, after age 45, if you are within normal weight and without risk factors for diabetes. Testing should be considered at a younger age or be carried out more frequently if you are overweight and have at least 1 risk factor for diabetes.  Colorectal cancer can be detected and often prevented. Most routine colorectal cancer screening begins at the age of 50 and continues through age 75. However, your health care provider may recommend screening at an earlier age if you have risk factors for colon cancer. On a yearly basis, your health care provider may provide home test kits to check for hidden blood in the stool. Use of a small camera at the end of a tube to directly examine the colon (sigmoidoscopy or colonoscopy) can detect the earliest forms of colorectal cancer. Talk to your health care provider about this at age 50, when routine screening begins. Direct exam of the colon should be repeated every 5-10 years through age 75, unless early forms of precancerous polyps or small growths are found.  People who are at an increased risk for hepatitis B should be screened for this virus. You are considered at high risk for hepatitis B if:  You were born in a country where hepatitis B occurs often. Talk with your health care provider about which countries are considered high risk.  Your parents were born in a high-risk country and you have not received a shot to protect against hepatitis B (hepatitis B vaccine).  You have HIV or AIDS.  You use needles to inject street drugs.  You live with, or have sex with, someone who has hepatitis B.  You are a man who has sex with other men (MSM).  You get hemodialysis  treatment.  You take certain medicines for conditions such as cancer, organ transplantation, and autoimmune conditions.  Hepatitis C blood testing is recommended for all people born from 1945 through 1965 and any individual with known risks for hepatitis C.  Practice safe sex. Use condoms and avoid high-risk sexual practices to reduce the spread of sexually transmitted infections (STIs). STIs include gonorrhea, chlamydia, syphilis, trichomonas, herpes, HPV, and human immunodeficiency virus (HIV). Herpes, HIV, and HPV are viral illnesses that have no cure. They can result in disability, cancer, and death.  If you are at risk of being infected with HIV, it is recommended that you take a prescription medicine daily to prevent HIV infection. This is called preexposure prophylaxis (PrEP). You are considered at risk if:  You are a man who has sex with other men (MSM) and have other risk factors.  You are a heterosexual man, are sexually active, and are at increased risk for HIV infection.    You take drugs by injection.  You are sexually active with a partner who has HIV.  Talk with your health care provider about whether you are at high risk of being infected with HIV. If you choose to begin PrEP, you should first be tested for HIV. You should then be tested every 3 months for as long as you are taking PrEP.  A one-time screening for abdominal aortic aneurysm (AAA) and surgical repair of large AAAs by ultrasound are recommended for men ages 32 to 67 years who are current or former smokers.  Healthy men should no longer receive prostate-specific antigen (PSA) blood tests as part of routine cancer screening. Talk with your health care provider about prostate cancer screening.  Testicular cancer screening is not recommended for adult males who have no symptoms. Screening includes self-exam, a health care provider exam, and other screening tests. Consult with your health care provider about any symptoms  you have or any concerns you have about testicular cancer.  Use sunscreen. Apply sunscreen liberally and repeatedly throughout the day. You should seek shade when your shadow is shorter than you. Protect yourself by wearing long sleeves, pants, a wide-brimmed hat, and sunglasses year round, whenever you are outdoors.  Once a month, do a whole-body skin exam, using a mirror to look at the skin on your back. Tell your health care provider about new moles, moles that have irregular borders, moles that are larger than a pencil eraser, or moles that have changed in shape or color.  Stay current with required vaccines (immunizations).  Influenza vaccine. All adults should be immunized every year.  Tetanus, diphtheria, and acellular pertussis (Td, Tdap) vaccine. An adult who has not previously received Tdap or who does not know his vaccine status should receive 1 dose of Tdap. This initial dose should be followed by tetanus and diphtheria toxoids (Td) booster doses every 10 years. Adults with an unknown or incomplete history of completing a 3-dose immunization series with Td-containing vaccines should begin or complete a primary immunization series including a Tdap dose. Adults should receive a Td booster every 10 years.  Varicella vaccine. An adult without evidence of immunity to varicella should receive 2 doses or a second dose if he has previously received 1 dose.  Human papillomavirus (HPV) vaccine. Males aged 68-21 years who have not received the vaccine previously should receive the 3-dose series. Males aged 22-26 years may be immunized. Immunization is recommended through the age of 6 years for any male who has sex with males and did not get any or all doses earlier. Immunization is recommended for any person with an immunocompromised condition through the age of 49 years if he did not get any or all doses earlier. During the 3-dose series, the second dose should be obtained 4-8 weeks after the first  dose. The third dose should be obtained 24 weeks after the first dose and 16 weeks after the second dose.  Zoster vaccine. One dose is recommended for adults aged 50 years or older unless certain conditions are present.  Measles, mumps, and rubella (MMR) vaccine. Adults born before [birth year] generally are considered immune to measles and mumps. Adults born in [birth year] or later should have 1 or more doses of MMR vaccine unless there is a contraindication to the vaccine or there is laboratory evidence of immunity to each of the three diseases. A routine second dose of MMR vaccine should be obtained at least 28 days after the first dose for students attending postsecondary  schools, health care workers, or international travelers. People who received inactivated measles vaccine or an unknown type of measles vaccine during 1963-1967 should receive 2 doses of MMR vaccine. People who received inactivated mumps vaccine or an unknown type of mumps vaccine before 1979 and are at high risk for mumps infection should consider immunization with 2 doses of MMR vaccine. Unvaccinated health care workers born before 1957 who lack laboratory evidence of measles, mumps, or rubella immunity or laboratory confirmation of disease should consider measles and mumps immunization with 2 doses of MMR vaccine or rubella immunization with 1 dose of MMR vaccine.  Pneumococcal 13-valent conjugate (PCV13) vaccine. When indicated, a person who is uncertain of his immunization history and has no record of immunization should receive the PCV13 vaccine. An adult aged 19 years or older who has certain medical conditions and has not been previously immunized should receive 1 dose of PCV13 vaccine. This PCV13 should be followed with a dose of pneumococcal polysaccharide (PPSV23) vaccine. The PPSV23 vaccine dose should be obtained at least 8 weeks after the dose of PCV13 vaccine. An adult aged 19 years or older who has certain medical conditions and  previously received 1 or more doses of PPSV23 vaccine should receive 1 dose of PCV13. The PCV13 vaccine dose should be obtained 1 or more years after the last PPSV23 vaccine dose.  Pneumococcal polysaccharide (PPSV23) vaccine. When PCV13 is also indicated, PCV13 should be obtained first. All adults aged 65 years and older should be immunized. An adult younger than age 65 years who has certain medical conditions should be immunized. Any person who resides in a nursing home or long-term care facility should be immunized. An adult smoker should be immunized. People with an immunocompromised condition and certain other conditions should receive both PCV13 and PPSV23 vaccines. People with human immunodeficiency virus (HIV) infection should be immunized as soon as possible after diagnosis. Immunization during chemotherapy or radiation therapy should be avoided. Routine use of PPSV23 vaccine is not recommended for American Indians, [place] Natives, or people younger than 65 years unless there are medical conditions that require PPSV23 vaccine. When indicated, people who have unknown immunization and have no record of immunization should receive PPSV23 vaccine. One-time revaccination 5 years after the first dose of PPSV23 is recommended for people aged 19-64 years who have chronic kidney failure, nephrotic syndrome, asplenia, or immunocompromised conditions. People who received 1-2 doses of PPSV23 before age 65 years should receive another dose of PPSV23 vaccine at age 65 years or later if at least 5 years have passed since the previous dose. Doses of PPSV23 are not needed for people immunized with PPSV23 at or after age 65 years.  Meningococcal vaccine. Adults with asplenia or persistent complement component deficiencies should receive 2 doses of quadrivalent meningococcal conjugate (MenACWY-D) vaccine. The doses should be obtained at least 2 months apart. Microbiologists working with certain meningococcal bacteria,  military recruits, people at risk during an outbreak, and people who travel to or live in countries with a high rate of meningitis should be immunized. A first-year college student up through age 21 years who is living in a residence hall should receive a dose if he did not receive a dose on or after his 16th birthday. Adults who have certain high-risk conditions should receive one or more doses of vaccine.  Hepatitis A vaccine. Adults who wish to be protected from this disease, have certain high-risk conditions, work with hepatitis A-infected animals, work in hepatitis A research labs, or   travel to or work in countries with a high rate of hepatitis A should be immunized. Adults who were previously unvaccinated and who anticipate close contact with an international adoptee during the first 60 days after arrival in the [place] from a country with a high rate of hepatitis A should be immunized.  Hepatitis B vaccine. Adults should be immunized if they wish to be protected from this disease, have certain high-risk conditions, may be exposed to blood or other infectious body fluids, are household contacts or sex partners of hepatitis B positive people, are clients or workers in certain care facilities, or travel to or work in countries with a high rate of hepatitis B.  Haemophilus influenzae type b (Hib) vaccine. A previously unvaccinated person with asplenia or sickle cell disease or having a scheduled splenectomy should receive 1 dose of Hib vaccine. Regardless of previous immunization, a recipient of a hematopoietic stem cell transplant should receive a 3-dose series 6-12 months after his successful transplant. Hib vaccine is not recommended for adults with HIV infection. Preventive Service / Frequency Ages 52 to 17  Blood pressure check.** / Every 1 to 2 years.  Lipid and cholesterol check.** / Every 5 years beginning at age 69.  Hepatitis C blood test.** / For any individual with known risks for  hepatitis C.  Skin self-exam. / Monthly.  Influenza vaccine. / Every year.  Tetanus, diphtheria, and acellular pertussis (Tdap, Td) vaccine.** / Consult your health care provider. 1 dose of Td every 10 years.  Varicella vaccine.** / Consult your health care provider.  HPV vaccine. / 3 doses over 6 months, if 72 or younger.  Measles, mumps, rubella (MMR) vaccine.** / You need at least 1 dose of MMR if you were born in 1957 or later. You may also need a second dose.  Pneumococcal 13-valent conjugate (PCV13) vaccine.** / Consult your health care provider.  Pneumococcal polysaccharide (PPSV23) vaccine.** / 1 to 2 doses if you smoke cigarettes or if you have certain conditions.  Meningococcal vaccine.** / 1 dose if you are age 35 to 60 years and a Market researcher living in a residence hall, or have one of several medical conditions. You may also need additional booster doses.  Hepatitis A vaccine.** / Consult your health care provider.  Hepatitis B vaccine.** / Consult your health care provider.  Haemophilus influenzae type b (Hib) vaccine.** / Consult your health care provider. Ages 35 to 8  Blood pressure check.** / Every 1 to 2 years.  Lipid and cholesterol check.** / Every 5 years beginning at age 57.  Lung cancer screening. / Every year if you are aged 44-80 years and have a 30-pack-year history of smoking and currently smoke or have quit within the past 15 years. Yearly screening is stopped once you have quit smoking for at least 15 years or develop a health problem that would prevent you from having lung cancer treatment.  Fecal occult blood test (FOBT) of stool. / Every year beginning at age 55 and continuing until age 73. You may not have to do this test if you get a colonoscopy every 10 years.  Flexible sigmoidoscopy** or colonoscopy.** / Every 5 years for a flexible sigmoidoscopy or every 10 years for a colonoscopy beginning at age 28 and continuing until age  1.  Hepatitis C blood test.** / For all people born from [birth year] through 1965 and any individual with known risks for hepatitis C.  Skin self-exam. / Monthly.  Influenza vaccine. / Every  year.  Tetanus, diphtheria, and acellular pertussis (Tdap/Td) vaccine.** / Consult your health care provider. 1 dose of Td every 10 years.  Varicella vaccine.** / Consult your health care provider.  Zoster vaccine.** / 1 dose for adults aged 53 years or older.  Measles, mumps, rubella (MMR) vaccine.** / You need at least 1 dose of MMR if you were born in 1957 or later. You may also need a second dose.  Pneumococcal 13-valent conjugate (PCV13) vaccine.** / Consult your health care provider.  Pneumococcal polysaccharide (PPSV23) vaccine.** / 1 to 2 doses if you smoke cigarettes or if you have certain conditions.  Meningococcal vaccine.** / Consult your health care provider.  Hepatitis A vaccine.** / Consult your health care provider.  Hepatitis B vaccine.** / Consult your health care provider.  Haemophilus influenzae type b (Hib) vaccine.** / Consult your health care provider. Ages 77 and over  Blood pressure check.** / Every 1 to 2 years.  Lipid and cholesterol check.**/ Every 5 years beginning at age 85.  Lung cancer screening. / Every year if you are aged 55-80 years and have a 30-pack-year history of smoking and currently smoke or have quit within the past 15 years. Yearly screening is stopped once you have quit smoking for at least 15 years or develop a health problem that would prevent you from having lung cancer treatment.  Fecal occult blood test (FOBT) of stool. / Every year beginning at age 33 and continuing until age 11. You may not have to do this test if you get a colonoscopy every 10 years.  Flexible sigmoidoscopy** or colonoscopy.** / Every 5 years for a flexible sigmoidoscopy or every 10 years for a colonoscopy beginning at age 28 and continuing until age 73.  Hepatitis C blood  test.** / For all people born from [birth year] through 1965 and any individual with known risks for hepatitis C.  Abdominal aortic aneurysm (AAA) screening.** / A one-time screening for ages 50 to 27 years who are current or former smokers.  Skin self-exam. / Monthly.  Influenza vaccine. / Every year.  Tetanus, diphtheria, and acellular pertussis (Tdap/Td) vaccine.** / 1 dose of Td every 10 years.  Varicella vaccine.** / Consult your health care provider.  Zoster vaccine.** / 1 dose for adults aged 34 years or older.  Pneumococcal 13-valent conjugate (PCV13) vaccine.** / Consult your health care provider.  Pneumococcal polysaccharide (PPSV23) vaccine.** / 1 dose for all adults aged 63 years and older.  Meningococcal vaccine.** / Consult your health care provider.  Hepatitis A vaccine.** / Consult your health care provider.  Hepatitis B vaccine.** / Consult your health care provider.  Haemophilus influenzae type b (Hib) vaccine.** / Consult your health care provider. **Family history and personal history of risk and conditions may change your health care provider's recommendations. Document Released: 11/18/2001 Document Revised: 09/27/2013 Document Reviewed: 02/17/2011 [facility] Patient Information 2015 [place], [place]. This information is not intended to replace advice given to you by your health care provider. Make sure you discuss any questions you have with your health care provider.

## 2015-01-15 ENCOUNTER — Encounter: Payer: Self-pay | Admitting: Family Medicine

## 2015-01-15 ENCOUNTER — Ambulatory Visit (INDEPENDENT_AMBULATORY_CARE_PROVIDER_SITE_OTHER): Payer: Medicare Other | Admitting: Family Medicine

## 2015-01-15 ENCOUNTER — Telehealth: Payer: Self-pay | Admitting: Gastroenterology

## 2015-01-15 ENCOUNTER — Ambulatory Visit (INDEPENDENT_AMBULATORY_CARE_PROVIDER_SITE_OTHER): Payer: Medicare Other

## 2015-01-15 VITALS — BP 142/74 | HR 62 | Temp 97.9°F | Ht 69.5 in | Wt 214.0 lb

## 2015-01-15 DIAGNOSIS — E559 Vitamin D deficiency, unspecified: Secondary | ICD-10-CM | POA: Diagnosis not present

## 2015-01-15 DIAGNOSIS — Z8249 Family history of ischemic heart disease and other diseases of the circulatory system: Secondary | ICD-10-CM | POA: Diagnosis not present

## 2015-01-15 DIAGNOSIS — Z800 Family history of malignant neoplasm of digestive organs: Secondary | ICD-10-CM

## 2015-01-15 DIAGNOSIS — E785 Hyperlipidemia, unspecified: Secondary | ICD-10-CM

## 2015-01-15 DIAGNOSIS — N400 Enlarged prostate without lower urinary tract symptoms: Secondary | ICD-10-CM | POA: Diagnosis not present

## 2015-01-15 LAB — POCT CBC
Granulocyte percent: 55.5 %G (ref 37–80)
HCT, POC: 46.2 % (ref 43.5–53.7)
HEMOGLOBIN: 14.7 g/dL (ref 14.1–18.1)
Lymph, poc: 3.1 (ref 0.6–3.4)
MCH, POC: 28.4 pg (ref 27–31.2)
MCHC: 31.8 g/dL (ref 31.8–35.4)
MCV: 89.2 fL (ref 80–97)
MPV: 7 fL (ref 0–99.8)
PLATELET COUNT, POC: 229 10*3/uL (ref 142–424)
POC GRANULOCYTE: 4.5 (ref 2–6.9)
POC LYMPH %: 38.1 % (ref 10–50)
RBC: 5.18 M/uL (ref 4.69–6.13)
RDW, POC: 13.1 %
WBC: 8.1 10*3/uL (ref 4.6–10.2)

## 2015-01-15 NOTE — Telephone Encounter (Signed)
He should now be on a 5 year recall interval so he is due for colonoscopy now. Please schedule him.

## 2015-01-15 NOTE — Progress Notes (Signed)
Subjective:    Patient ID: Samuel Wheeler, male    DOB: Aug 18, 1946, 69 y.o.   MRN: [id number]  HPI Pt here for follow up and management of chronic medical problems which includes hyperlipidemia and family history of heart disease. He is taking medications regularly. The patient has a history of a tick bite in the past and he's not had any problems from this. He is due for lab work today to follow-up on his cholesterol and we'll get a chest x-ray for general follow-up. The patient's family history is positive for stroke in his parents heart disease and a sister and colon cancer in a sister. He has had a colonoscopy about 7 years ago. His sister recently developed colon cancer. The patient denies chest pain shortness of breath or GI symptoms or voiding symptoms. He has had a negative FOBT.        Patient Active Problem List   Diagnosis Date Noted  . PVC's (premature ventricular contractions) 11/01/2014  . 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 HLD (hyperlipidemia) 01/27/2013  . BPH (benign prostatic hyperplasia) 01/27/2013  . Erectile dysfunction 01/27/2013  . Genital herpes simplex 01/27/2013  . Actinic keratosis 01/27/2013  . Communicable disease 01/27/2013  . Need for zoster vaccination 01/27/2013   Outpatient Encounter Prescriptions as of 01/15/2015  Medication Sig  . albuterol (PROVENTIL HFA;VENTOLIN HFA) 108 (90 BASE) MCG/ACT inhaler Inhale 2 puffs into the lungs every 6 (six) hours as needed for wheezing or shortness of breath.  Marland Kitchen atorvastatin (LIPITOR) 20 MG tablet Take 1 tablet (20 mg total) by mouth daily.  . Cetirizine HCl (ZYRTEC ALLERGY) 10 MG CAPS Take 1 capsule (10 mg total) by mouth daily.  Marland Kitchen doxazosin (CARDURA) 2 MG tablet TAKE 1 TABLET AT BEDTIME  . finasteride (PROSCAR) 5 MG tablet TAKE 1 TABLET DAILY  . fluticasone (FLONASE) 50 MCG/ACT nasal spray Place 2 sprays into both nostrils daily.  . Naproxen Sodium 220 MG CAPS Take 220 mg by mouth as needed.  Marland Kitchen NIASPAN 1000  MG CR tablet TAKE 1 TABLET AT BEDTIME  . valACYclovir (VALTREX) 1000 MG tablet Take 1 tablet (1,000 mg total) by mouth daily.     Review of Systems  Constitutional: Negative.   HENT: Negative.   Eyes: Negative.   Respiratory: Negative.   Cardiovascular: Negative.   Gastrointestinal: Negative.   Endocrine: Negative.   Genitourinary: Negative.   Musculoskeletal: Negative.   Skin: Negative.   Allergic/Immunologic: Negative.   Neurological: Negative.   Hematological: Negative.   Psychiatric/Behavioral: Negative.        Objective:   Physical Exam  Constitutional: He is oriented to person, place, and time. He appears well-developed and well-nourished.  HENT:  Head: Normocephalic and atraumatic.  Right Ear: External ear normal.  Left Ear: External ear normal.  Nose: Nose normal.  Mouth/Throat: Oropharynx is clear and moist. No oropharyngeal exudate.  The patient wears bilateral hearing aids  Eyes: Conjunctivae and EOM are normal. Pupils are equal, round, and reactive to light. Right eye exhibits no discharge. Left eye exhibits no discharge. No scleral icterus.  Neck: Normal range of motion. Neck supple. No thyromegaly present.  Cardiovascular: Normal rate, regular rhythm, normal heart sounds and intact distal pulses.  Exam reveals no gallop and no friction rub.   No murmur heard. The heart is regular at 60/m  Pulmonary/Chest: Effort normal and breath sounds normal. No respiratory distress. He has no wheezes. He has no rales. He exhibits no tenderness.  There is  no axillary adenopathy  Abdominal: Soft. Bowel sounds are normal. He exhibits no mass. There is no tenderness. There is no rebound and no guarding.  There is no abdominal tenderness or inguinal adenopathy  Musculoskeletal: Normal range of motion. He exhibits no edema or tenderness.  Lymphadenopathy:    He has no cervical adenopathy.  Neurological: He is alert and oriented to person, place, and time. He has normal  reflexes. No cranial nerve deficit.  Skin: Skin is warm and dry. No rash noted. No erythema. No pallor.  Psychiatric: He has a normal mood and affect. His behavior is normal. Judgment and thought content normal.  Nursing note and vitals reviewed.  BP 149/80 mmHg  Pulse 61  Temp(Src) 97.9 F (36.6 C) (Oral)  Ht 5' 9.5" (1.765 m)  Wt 214 lb (97.07 kg)  BMI 31.16 kg/m2  [facility] reading (PRIMARY) by  Dr. Laurance Flatten--- chest x-ray-    no active disease                                    Assessment & Plan:   1. Family history of heart disease -The patient has since had a stress test by the cardiologist and this was normal according to the patient. - DG Chest 2 View; Future - POCT CBC - BMP8+EGFR - Hepatic function panel - NMR, lipoprofile  2. HLD (hyperlipidemia) -His blood pressure is slightly elevated today but his home readings are good including a recent wellness visit here in the office. -I have stressed the importance to the patient of monitoring his blood pressures at home and keeping his weight down and watching his sodium intake - DG Chest 2 View; Future - POCT CBC - NMR, lipoprofile  3. BPH (benign prostatic hyperplasia) -The patient is having no symptoms regarding this. - POCT CBC  4. Vitamin D deficiency -Any treatment for vitamin D deficiency will be dependent upon lab work being done today. - Vit D  25 hydroxy (rtn osteoporosis monitoring)  5. Family history of colon cancer -His sister has developed colon cancer in the past couple of years and the patient's last colonoscopy was about 7 years ago. His review of systems as far as his abdominal history is negative other than the recent bout of diverticulitis  No orders of the defined types were placed in this encounter.   Patient Instructions                       Medicare Annual Wellness Visit  [facility] and the medical providers at [facility] strive to bring you the best medical care.  In  doing so we not only want to address your current medical conditions and concerns but also to detect new conditions early and prevent illness, disease and health-related problems.    Medicare offers a yearly Wellness Visit which allows our clinical staff to assess your need for preventative services including immunizations, lifestyle education, counseling to decrease risk of preventable diseases and screening for fall risk and other medical concerns.    This visit is provided free of charge (no copay) for all Medicare recipients. The clinical pharmacists at [facility] have begun to conduct these Wellness Visits which will also include a thorough review of all your medications.    As you primary medical provider recommend that you make an appointment for your Annual Wellness Visit if you have not  done so already this year.  You may set up this appointment before you leave today or you may call back ([phone]) and schedule an appointment.  Please make sure when you call that you mention that you are scheduling your Annual Wellness Visit with the clinical pharmacist so that the appointment may be made for the proper length of time.     Continue current medications. Continue good therapeutic lifestyle changes which include good diet and exercise. Fall precautions discussed with patient. If an FOBT was given today- please return it to our front desk. If you are over 78 years old - you may need Prevnar 37 or the adult Pneumonia vaccine.  Flu Shots are still available at our office. If you still haven't had one please call to set up a nurse visit to get one.   After your visit with [place] today you will receive a survey in the mail or online from [employer] regarding your care with [place]. Please take a moment to fill this out. Your feedback is very important to [place] as you can help [place] better understand your patient needs as well as improve your experience and satisfaction. WE CARE ABOUT  YOU!!!   We will call you with the results of your lab work and chest x-ray as soon as those results become available Continue to watch sodium intake and monitor blood pressures at home We will arrange for the gastroenterologist for you to have a colonoscopy sooner than 3 years from now. This is being done because of your sister having had colon cancer. Always  work on your weight continuously and stay as active as possible and keep your weight as low as possible   Arrie Senate MD

## 2015-01-15 NOTE — Telephone Encounter (Signed)
Re routing to Dr. Stark 

## 2015-01-15 NOTE — Telephone Encounter (Signed)
Patient had a colonoscopy in 9/09 that showed diverticulosis.  He has a sister recently diagnosed with colon cancer.  He is asking if this will change his recall date? Current recall is in for 06/2018. Please advise

## 2015-01-15 NOTE — Patient Instructions (Addendum)
Medicare Annual Wellness Visit  [facility] and the medical providers at [facility] strive to bring you the best medical care.  In doing so we not only want to address your current medical conditions and concerns but also to detect new conditions early and prevent illness, disease and health-related problems.    Medicare offers a yearly Wellness Visit which allows our clinical staff to assess your need for preventative services including immunizations, lifestyle education, counseling to decrease risk of preventable diseases and screening for fall risk and other medical concerns.    This visit is provided free of charge (no copay) for all Medicare recipients. The clinical pharmacists at [facility] have begun to conduct these Wellness Visits which will also include a thorough review of all your medications.    As you primary medical provider recommend that you make an appointment for your Annual Wellness Visit if you have not done so already this year.  You may set up this appointment before you leave today or you may call back ([phone]) and schedule an appointment.  Please make sure when you call that you mention that you are scheduling your Annual Wellness Visit with the clinical pharmacist so that the appointment may be made for the proper length of time.     Continue current medications. Continue good therapeutic lifestyle changes which include good diet and exercise. Fall precautions discussed with patient. If an FOBT was given today- please return it to our front desk. If you are over 68 years old - you may need Prevnar 76 or the adult Pneumonia vaccine.  Flu Shots are still available at our office. If you still haven't had one please call to set up a nurse visit to get one.   After your visit with [place] today you will receive a survey in the mail or online from [employer] regarding your care with [place]. Please take a moment to  fill this out. Your feedback is very important to [place] as you can help [place] better understand your patient needs as well as improve your experience and satisfaction. WE CARE ABOUT YOU!!!   We will call you with the results of your lab work and chest x-ray as soon as those results become available Continue to watch sodium intake and monitor blood pressures at home We will arrange for the gastroenterologist for you to have a colonoscopy sooner than 3 years from now. This is being done because of your sister having had colon cancer. Always  work on your weight continuously and stay as active as possible and keep your weight as low as possible

## 2015-01-16 ENCOUNTER — Other Ambulatory Visit: Payer: Self-pay | Admitting: Family Medicine

## 2015-01-16 LAB — BMP8+EGFR
BUN / CREAT RATIO: 27 — AB (ref 10–22)
BUN: 22 mg/dL (ref 8–27)
CO2: 24 mmol/L (ref 18–29)
CREATININE: 0.82 mg/dL (ref 0.76–1.27)
Calcium: 9 mg/dL (ref 8.6–10.2)
Chloride: 105 mmol/L (ref 97–108)
GFR calc Af Amer: 106 mL/min/{1.73_m2} (ref >59)
GFR calc non Af Amer: 92 mL/min/{1.73_m2} (ref >59)
Glucose: 96 mg/dL (ref 65–99)
Potassium: 4.1 mmol/L (ref 3.5–5.2)
Sodium: 142 mmol/L (ref 134–144)

## 2015-01-16 LAB — NMR, LIPOPROFILE
CHOLESTEROL: 151 mg/dL (ref 100–199)
HDL Cholesterol by NMR: 45 mg/dL (ref >39)
HDL Particle Number: 31.2 umol/L (ref >=30.5)
LDL Particle Number: 975 nmol/L (ref <1000)
LDL SIZE: 20.8 nm (ref >20.5)
LDL-C: 81 mg/dL (ref 0–99)
LP-IR Score: 62 — ABNORMAL HIGH (ref <=45)
Small LDL Particle Number: 528 nmol/L — ABNORMAL HIGH (ref <=527)
Triglycerides by NMR: 126 mg/dL (ref 0–149)

## 2015-01-16 LAB — HEPATIC FUNCTION PANEL
ALT: 46 IU/L — ABNORMAL HIGH (ref 0–44)
AST: 27 IU/L (ref 0–40)
Albumin: 4.2 g/dL (ref 3.6–4.8)
Alkaline Phosphatase: 73 IU/L (ref 39–117)
BILIRUBIN TOTAL: 0.7 mg/dL (ref 0.0–1.2)
Bilirubin, Direct: 0.19 mg/dL (ref 0.00–0.40)
Total Protein: 6.9 g/dL (ref 6.0–8.5)

## 2015-01-16 LAB — VITAMIN D 25 HYDROXY (VIT D DEFICIENCY, FRACTURES): VIT D 25 HYDROXY: 28.7 ng/mL — AB (ref 30.0–100.0)

## 2015-01-16 NOTE — Telephone Encounter (Signed)
Patient is scheduled for a colonoscopy and pre-visit for 01/22/15 and 01/25/15.  Patient is aware of the appt date and times and Dr. Fuller Plan recommendations.

## 2015-01-17 MED ORDER — VITAMIN D (ERGOCALCIFEROL) 1.25 MG (50000 UNIT) PO CAPS: 50000.0000 [IU] | ORAL_CAPSULE | ORAL | Status: DC

## 2015-01-17 NOTE — Addendum Note (Signed)
Addended by: Thana Ates on: 01/17/2015 10:21 AM   Modules accepted: Orders

## 2015-01-22 ENCOUNTER — Ambulatory Visit (AMBULATORY_SURGERY_CENTER): Payer: Self-pay | Admitting: *Deleted

## 2015-01-22 VITALS — Ht 69.0 in | Wt 216.2 lb

## 2015-01-22 DIAGNOSIS — Z800 Family history of malignant neoplasm of digestive organs: Secondary | ICD-10-CM

## 2015-01-22 MED ORDER — NA SULFATE-K SULFATE-MG SULF 17.5-3.13-1.6 GM/177ML PO SOLN: 1.0000 | Freq: Once | ORAL | Status: DC

## 2015-01-22 NOTE — Progress Notes (Signed)
No egg or soy allergy No diet pills No home 02  no issues with past sedation No e mail

## 2015-01-25 ENCOUNTER — Encounter: Payer: Self-pay | Admitting: Gastroenterology

## 2015-01-25 ENCOUNTER — Ambulatory Visit (AMBULATORY_SURGERY_CENTER): Payer: Medicare Other | Admitting: Gastroenterology

## 2015-01-25 VITALS — BP 124/76 | HR 62 | Temp 97.8°F | Resp 16 | Ht 69.0 in | Wt 216.0 lb

## 2015-01-25 DIAGNOSIS — K635 Polyp of colon: Secondary | ICD-10-CM

## 2015-01-25 DIAGNOSIS — Z800 Family history of malignant neoplasm of digestive organs: Secondary | ICD-10-CM | POA: Diagnosis not present

## 2015-01-25 DIAGNOSIS — I10 Essential (primary) hypertension: Secondary | ICD-10-CM | POA: Diagnosis not present

## 2015-01-25 DIAGNOSIS — D124 Benign neoplasm of descending colon: Secondary | ICD-10-CM

## 2015-01-25 DIAGNOSIS — Z1211 Encounter for screening for malignant neoplasm of colon: Secondary | ICD-10-CM | POA: Diagnosis not present

## 2015-01-25 DIAGNOSIS — D125 Benign neoplasm of sigmoid colon: Secondary | ICD-10-CM | POA: Diagnosis not present

## 2015-01-25 MED ORDER — SODIUM CHLORIDE 0.9 % IV SOLN: 500.0000 mL | INTRAVENOUS | Status: DC

## 2015-01-25 NOTE — Progress Notes (Signed)
Called to room to assist during endoscopic procedure.  Patient ID and intended procedure confirmed with present staff. Received instructions for my participation in the procedure from the performing physician.  

## 2015-01-25 NOTE — Op Note (Signed)
[facility] [place]  [address]. [place], [address]   COLONOSCOPY PROCEDURE REPORT  PATIENT: Samuel Wheeler, Samuel Wheeler  MR#: [id number] BIRTHDATE: 1946/07/10 , 74  yrs. old GENDER: male ENDOSCOPIST: Ladene Artist, MD, [facility] PROCEDURE DATE:  01/25/2015 PROCEDURE:   Colonoscopy, screening and Colonoscopy with snare polypectomy First Screening Colonoscopy - Avg.  risk and is 50 yrs.  old or older - No.  Prior Negative Screening - Now for repeat screening. Less than 10 yrs Prior Negative Screening - Now for repeat screening.  Above average risk  History of Adenoma - Now for follow-up colonoscopy & has been > or = to 3 yrs.  N/A ASA CLASS:   Class II INDICATIONS:Screening for colonic neoplasia and FH Colon or Rectal Adenocarcinoma. MEDICATIONS: Monitored anesthesia care and Propofol 300 mg IV DESCRIPTION OF PROCEDURE:   After the risks benefits and alternatives of the procedure were thoroughly explained, informed consent was obtained.  The digital rectal exam revealed no abnormalities of the rectum.   The LB CF-H180AL Loaner [id number] endoscope was introduced through the anus and advanced to the cecum, which was identified by both the appendix and ileocecal valve. No adverse events experienced.   The quality of the prep was excellent.  (Suprep was used)  The instrument was then slowly withdrawn as the colon was fully examined.    COLON FINDINGS: Two sessile polyps measuring 6 mm in size were found in the sigmoid colon and descending colon.  Polypectomies were performed with a cold snare.  The resection was complete, the polyp tissue was completely retrieved and sent to histology.   There was moderate diverticulosis noted in the descending colon and sigmoid colon with associated muscular hypertrophy and luminal narrowing. The examination was otherwise normal.  Retroflexed views revealed no abnormalities. The time to cecum = 3.7 Withdrawal time = 12.0 The scope was withdrawn and the  procedure completed. COMPLICATIONS: There were no immediate complications.  ENDOSCOPIC IMPRESSION: 1.   Two sessile polyps in the sigmoid colon and descending colon; polypectomies performed with a cold snare 2.   Moderate diverticulosis in the descending colon and sigmoid colon  RECOMMENDATIONS: 1.  Await pathology results 2.  High fiber diet with liberal fluid intake. 3.  Repeat Colonoscopy in 5 years.  eSigned:  Ladene Artist, MD, [facility] 01/25/2015 2:51 PM

## 2015-01-25 NOTE — Patient Instructions (Signed)
YOU HAD AN ENDOSCOPIC PROCEDURE TODAY AT [facility]:   Refer to the procedure report that was given to you for any specific questions about what was found during the examination.  If the procedure report does not answer your questions, please call your gastroenterologist to clarify.  If you requested that your care partner not be given the details of your procedure findings, then the procedure report has been included in a sealed envelope for you to review at your convenience later.  YOU SHOULD EXPECT: Some feelings of bloating in the abdomen. Passage of more gas than usual.  Walking can help get rid of the air that was put into your GI tract during the procedure and reduce the bloating. If you had a lower endoscopy (such as a colonoscopy or flexible sigmoidoscopy) you may notice spotting of blood in your stool or on the toilet paper. If you underwent a bowel prep for your procedure, you may not have a normal bowel movement for a few days.  Please Note:  You might notice some irritation and congestion in your nose or some drainage.  This is from the oxygen used during your procedure.  There is no need for concern and it should clear up in a day or so.  SYMPTOMS TO REPORT IMMEDIATELY:   Following lower endoscopy (colonoscopy or flexible sigmoidoscopy):  Excessive amounts of blood in the stool  Significant tenderness or worsening of abdominal pains  Swelling of the abdomen that is new, acute  Fever of 100F or higher   For urgent or emergent issues, a gastroenterologist can be reached at any hour by calling [phone].   DIET: Your first meal following the procedure should be a small meal and then it is ok to progress to your normal diet. Heavy or fried foods are harder to digest and may make you feel nauseous or bloated.  Likewise, meals heavy in dairy and vegetables can increase bloating.  Drink plenty of fluids but you should avoid alcoholic beverages for 24  hours.  ACTIVITY:  You should plan to take it easy for the rest of today and you should NOT DRIVE or use heavy machinery until tomorrow (because of the sedation medicines used during the test).    FOLLOW UP: Our staff will call the number listed on your records the next business day following your procedure to check on you and address any questions or concerns that you may have regarding the information given to you following your procedure. If we do not reach you, we will leave a message.  However, if you are feeling well and you are not experiencing any problems, there is no need to return our call.  We will assume that you have returned to your regular daily activities without incident.  If any biopsies were taken you will be contacted by phone or by letter within the next 1-3 weeks.  Please call us at [phone] if you have not heard about the biopsies in 3 weeks.    SIGNATURES/CONFIDENTIALITY: You and/or your care partner have signed paperwork which will be entered into your electronic medical record.  These signatures attest to the fact that that the information above on your After Visit Summary has been reviewed and is understood.  Full responsibility of the confidentiality of this discharge information lies with you and/or your care-partner.    Handouts were given to your care partner on polyps, diverticulosis,and a high fiber diet with liberal fluid intake. You may resume your  current medications today. Await biopsy results. Please call if any questions or concerns.

## 2015-01-25 NOTE — Progress Notes (Signed)
Patient awakening,vss,report to rn 

## 2015-01-25 NOTE — Progress Notes (Signed)
No problems noted in the recovery room. maw 

## 2015-01-26 ENCOUNTER — Telehealth: Payer: Self-pay | Admitting: *Deleted

## 2015-01-26 NOTE — Telephone Encounter (Signed)
  Follow up Call-  Call back number 01/25/2015  Post procedure Call Back phone  # [phone]  Permission to leave phone message Yes     Patient questions:  Do you have a fever, pain , or abdominal swelling? No. Pain Score  0 *  Have you tolerated food without any problems? Yes.    Have you been able to return to your normal activities? Yes.    Do you have any questions about your discharge instructions: Diet   No. Medications  No. Follow up visit  No.  Do you have questions or concerns about your Care? No.  Actions: * If pain score is 4 or above: No action needed, pain <4.

## 2015-02-08 ENCOUNTER — Encounter: Payer: Self-pay | Admitting: Gastroenterology

## 2015-02-13 ENCOUNTER — Other Ambulatory Visit: Payer: Self-pay | Admitting: *Deleted

## 2015-02-13 ENCOUNTER — Other Ambulatory Visit: Payer: Self-pay

## 2015-02-13 DIAGNOSIS — J302 Other seasonal allergic rhinitis: Secondary | ICD-10-CM

## 2015-02-13 DIAGNOSIS — A6000 Herpesviral infection of urogenital system, unspecified: Secondary | ICD-10-CM

## 2015-02-13 MED ORDER — CETIRIZINE HCL 10 MG PO CAPS: 1.0000 | ORAL_CAPSULE | Freq: Every day | ORAL | Status: DC

## 2015-02-13 MED ORDER — VALACYCLOVIR HCL 1 G PO TABS: 1000.0000 mg | ORAL_TABLET | Freq: Every day | ORAL | Status: DC

## 2015-03-01 ENCOUNTER — Other Ambulatory Visit: Payer: Self-pay | Admitting: Family Medicine

## 2015-03-13 ENCOUNTER — Telehealth: Payer: Self-pay | Admitting: Family Medicine

## 2015-03-13 NOTE — Telephone Encounter (Signed)
Done 02/13/15

## 2015-03-14 ENCOUNTER — Other Ambulatory Visit: Payer: Self-pay

## 2015-03-14 DIAGNOSIS — J302 Other seasonal allergic rhinitis: Secondary | ICD-10-CM

## 2015-03-14 MED ORDER — CETIRIZINE HCL 10 MG PO CAPS: 1.0000 | ORAL_CAPSULE | Freq: Every day | ORAL | Status: DC

## 2015-03-16 ENCOUNTER — Encounter: Payer: Self-pay | Admitting: *Deleted

## 2015-03-21 IMAGING — CT CT ABD-PELV W/ CM
3 of 8 series · 15 of 46 positions shown, 17 images · IV contrast (Omnipaque 300)
Comparison: Prior MRI lumbar spine 03/09/2004

CLINICAL DATA: 68-year-old male with 1 week history of left-sided
abdominal pain

EXAM:
CT ABDOMEN AND PELVIS WITH CONTRAST
TECHNIQUE: Multidetector CT imaging of the abdomen and pelvis was performed
using the standard protocol following bolus administration of
intravenous contrast.
CONTRAST:  100mL OMNIPAQUE IOHEXOL 300 MG/ML  SOLN

[Series 2: abd_pel_with 5.0 b40f · axial · 0.90mm/px · z∈[-266,-6]mm · 10 of 64 slices shown, 12 images (1 of 2)]
[im 6/64  soft-tissue]
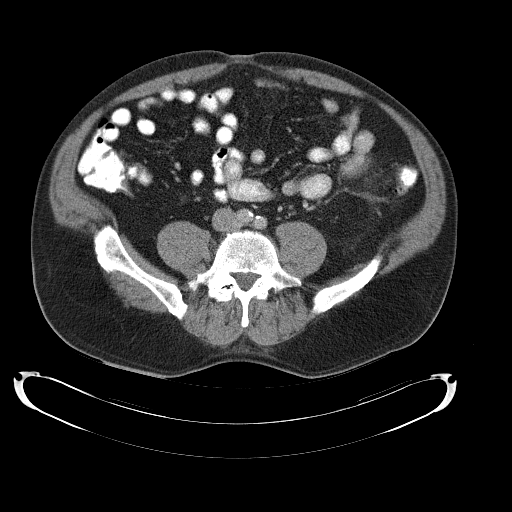
[im 6/64  bone]
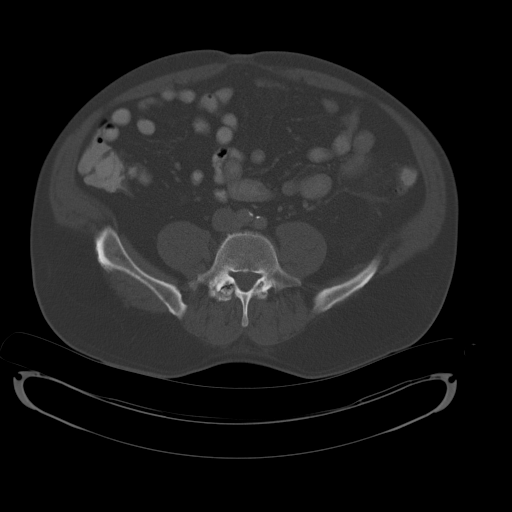
[im 12/64  soft-tissue]
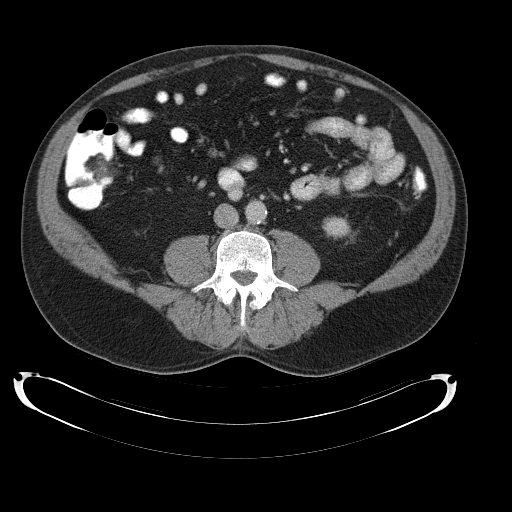
[im 18/64  soft-tissue]
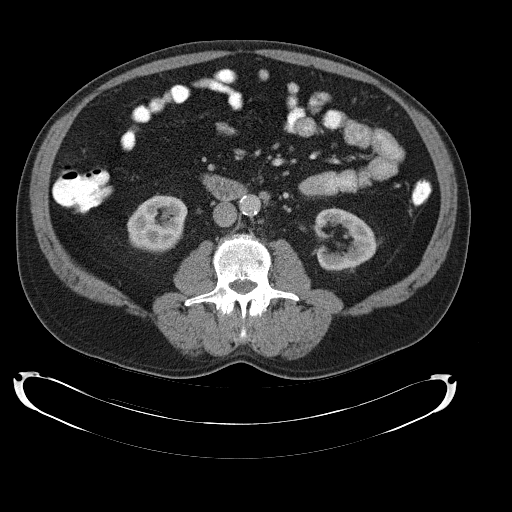
[im 23/64  soft-tissue]
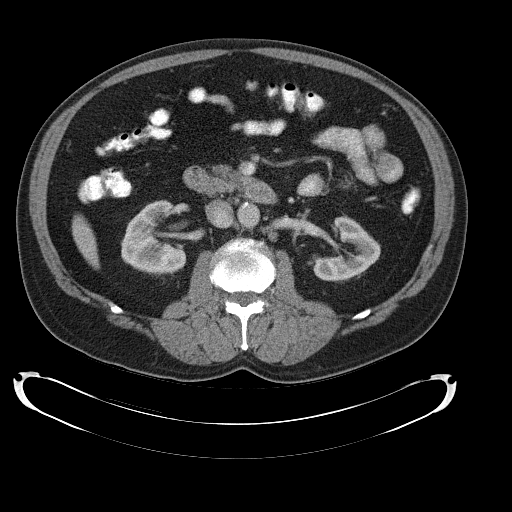
[im 29/64  soft-tissue]
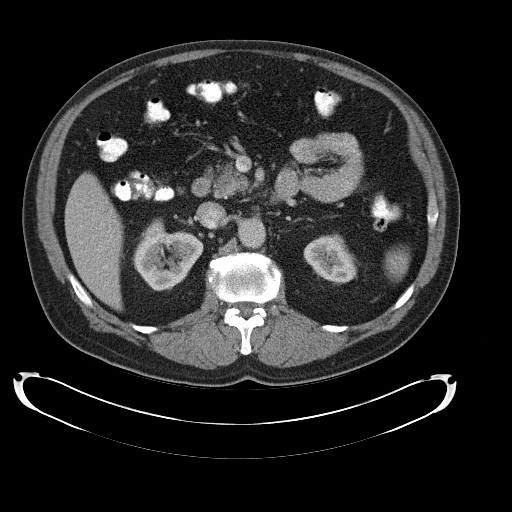
[im 35/64  soft-tissue]
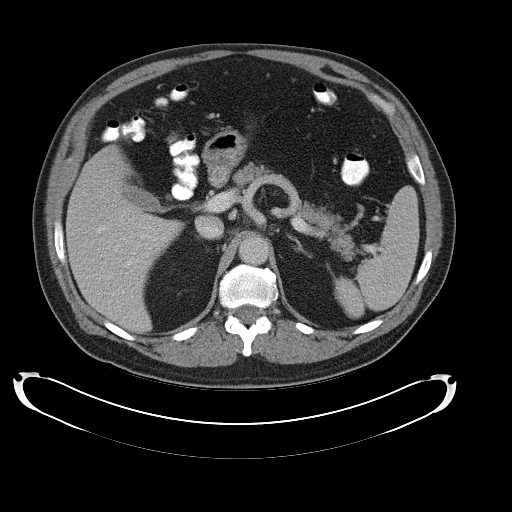
[im 41/64  soft-tissue]
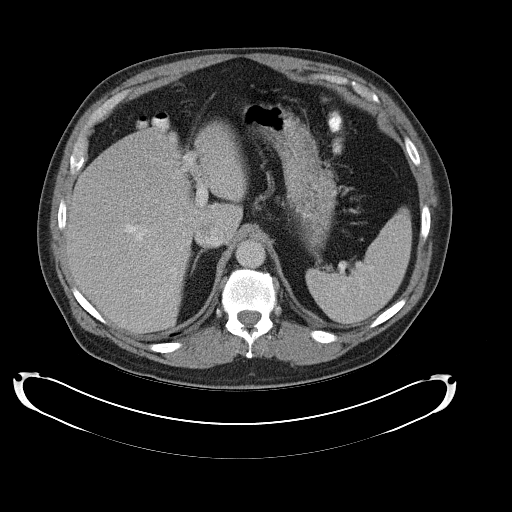
[im 46/64  soft-tissue]
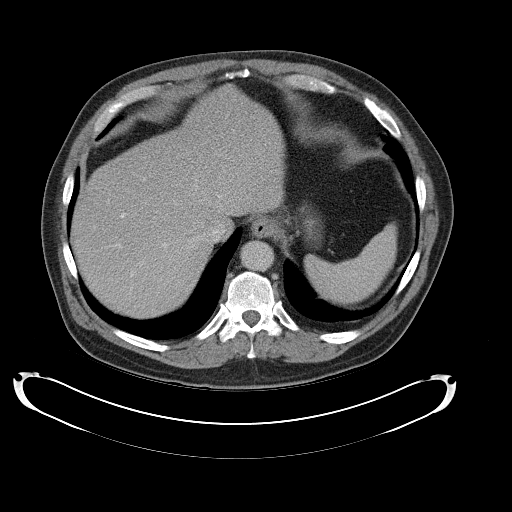
[im 52/64  soft-tissue]
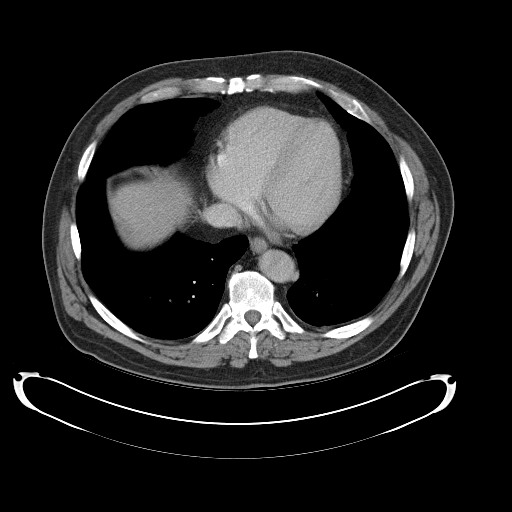
[im 52/64  bone]
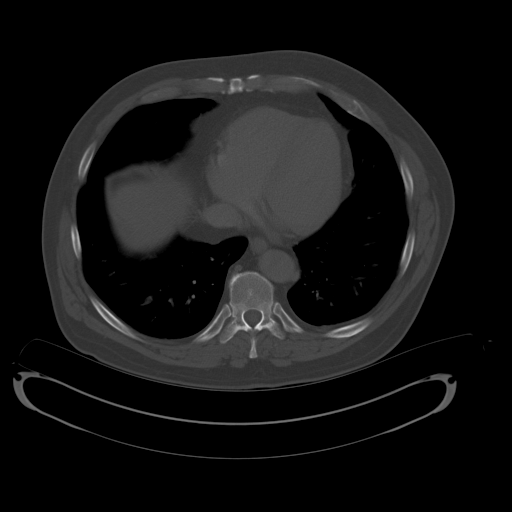
[im 58/64  soft-tissue]
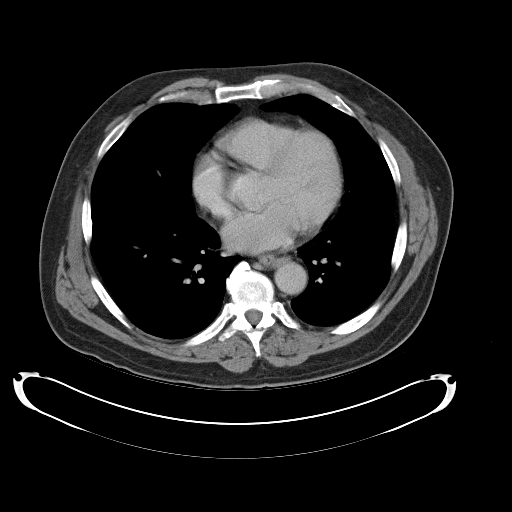

[Series 3: abd_pel_with 3.0 spo cor · coronal · 0.64mm/px · 3 of 94 slices shown]
[im 24/94  soft-tissue]
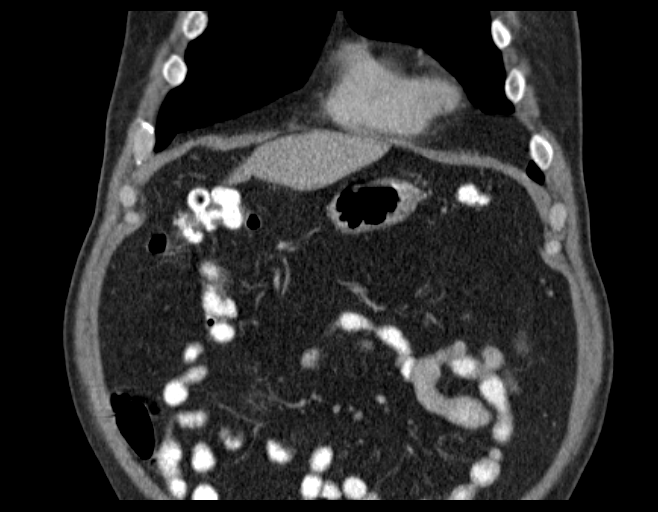
[im 47/94  soft-tissue]
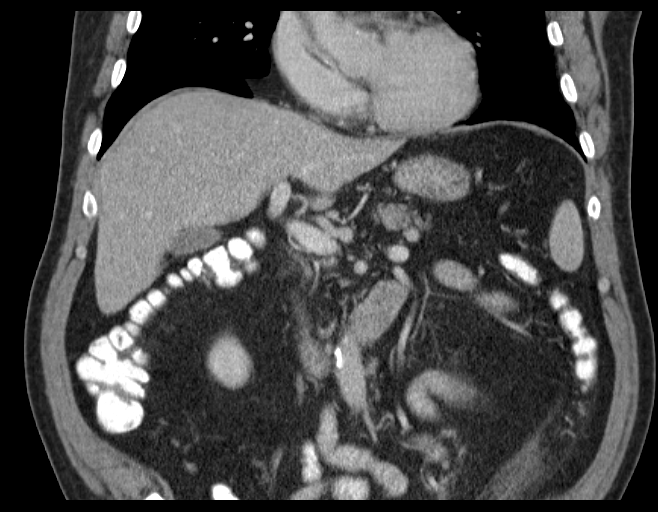
[im 70/94  soft-tissue]
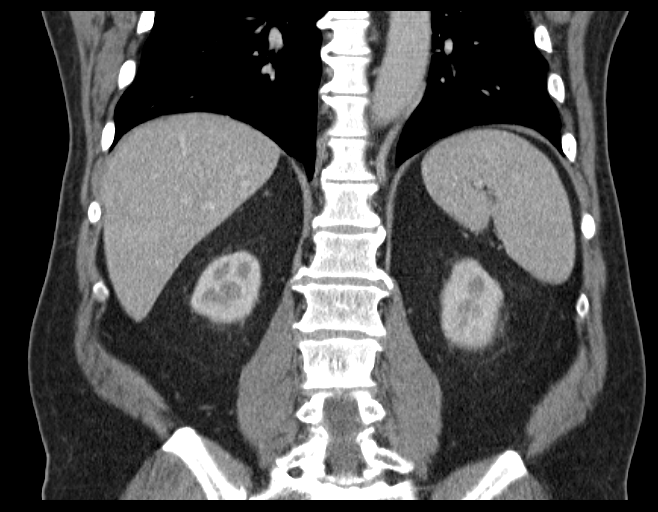

[Series 8: abd_pel_with 5.0 b40f · axial · 0.90mm/px · z∈[-428,-398]mm · 2 of 39 slices shown (2 of 2)]
[im 7/39  soft-tissue]
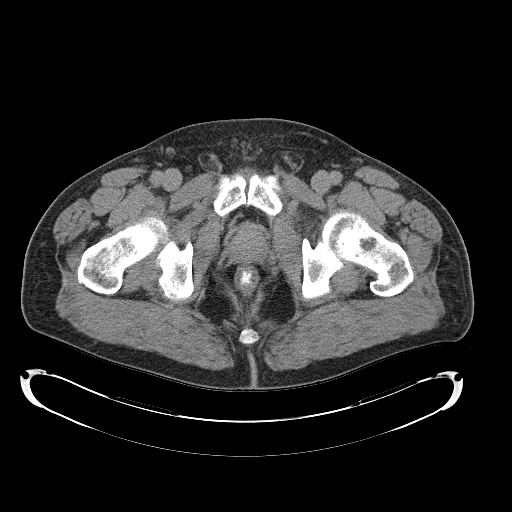
[im 13/39  soft-tissue]
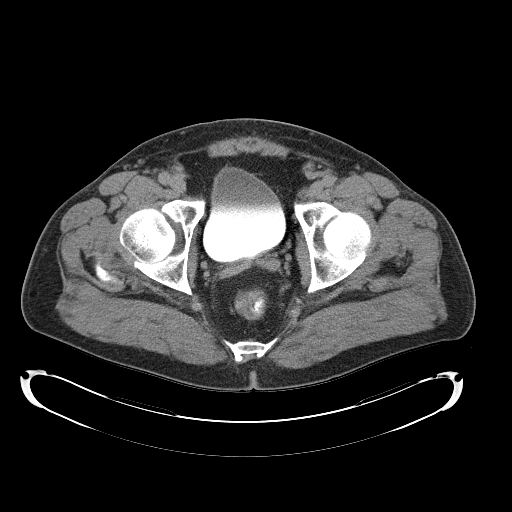

[15 of 46 positions shown; findings below may reference images not displayed]

FINDINGS: Lower Chest: The lung bases are clear. Visualized cardiac structures
are within normal limits for size. No pericardial effusion.
Unremarkable visualized distal thoracic esophagus.

Abdomen: Unremarkable CT appearance of the stomach, duodenum,
spleen, adrenal glands and pancreas. Normal hepatic contour and
morphology. No discrete hepatic lesion. Gallbladder is unremarkable.
No intra or extrahepatic biliary ductal dilatation. Unremarkable
appearance of the bilateral kidneys. No focal solid lesion,
hydronephrosis or nephrolithiasis.

Colonic diverticulosis. The sigmoid colon is redundant. There is
focal intramural edema and thickening involving the distal
descending colon in a region of the diverticulosis. Positive for
associated interstitial stranding in the descending colonic
mesocolon with thickening of the anterior para renal and the lateral
conal fascia. A single tiny locule of extraluminal air is identified
within the region of inflammatory stranding. No free fluid or
abscess.

Pelvis: Unremarkable bladder, prostate gland and seminal vesicles.
No free fluid or suspicious adenopathy.

Bones/Soft Tissues: No acute fracture or aggressive appearing lytic
or blastic osseous lesion. L5-S1 degenerative disc disease and L4-L5
and L5-S1 facet arthropathy. Degenerative disc disease with
posterior disc bulge and calcification of the posterior annulus
fibrosis also present at L1-L2 and L2-L3.

Vascular: Atherosclerotic vascular disease without significant
stenosis or aneurysmal dilatation. Patent portal veins. Retro aortic
left renal vein noted incidentally.
IMPRESSION: 1. CT findings are consistent with acute descending colonic
diverticulitis. There may be a single tiny locule of extraluminal
air concerning for early micro perforation but no evidence of free
fluid or abscess.
2. Lower lumbar degenerative disc disease and facet arthropathy.
3. Retro aortic left renal vein incidentally noted.
These results will be called to the ordering clinician or
representative by the Radiologist Assistant, and communication
documented in the PACS or zVision Dashboard.

## 2015-03-29 ENCOUNTER — Other Ambulatory Visit: Payer: Self-pay

## 2015-03-29 ENCOUNTER — Telehealth: Payer: Self-pay | Admitting: Family Medicine

## 2015-03-29 ENCOUNTER — Other Ambulatory Visit: Payer: Self-pay | Admitting: *Deleted

## 2015-03-29 DIAGNOSIS — J302 Other seasonal allergic rhinitis: Secondary | ICD-10-CM

## 2015-03-29 MED ORDER — CETIRIZINE HCL 10 MG PO CAPS: 1.0000 | ORAL_CAPSULE | Freq: Every day | ORAL | Status: DC

## 2015-05-16 ENCOUNTER — Other Ambulatory Visit: Payer: Self-pay | Admitting: Family Medicine

## 2015-05-23 ENCOUNTER — Encounter: Payer: Self-pay | Admitting: Family Medicine

## 2015-05-23 ENCOUNTER — Ambulatory Visit (INDEPENDENT_AMBULATORY_CARE_PROVIDER_SITE_OTHER): Payer: Medicare Other | Admitting: Family Medicine

## 2015-05-23 VITALS — BP 136/89 | HR 71 | Temp 98.0°F | Ht 69.0 in | Wt 212.0 lb

## 2015-05-23 DIAGNOSIS — N400 Enlarged prostate without lower urinary tract symptoms: Secondary | ICD-10-CM

## 2015-05-23 DIAGNOSIS — Z800 Family history of malignant neoplasm of digestive organs: Secondary | ICD-10-CM

## 2015-05-23 DIAGNOSIS — Z8249 Family history of ischemic heart disease and other diseases of the circulatory system: Secondary | ICD-10-CM | POA: Diagnosis not present

## 2015-05-23 DIAGNOSIS — E785 Hyperlipidemia, unspecified: Secondary | ICD-10-CM | POA: Diagnosis not present

## 2015-05-23 DIAGNOSIS — E559 Vitamin D deficiency, unspecified: Secondary | ICD-10-CM | POA: Diagnosis not present

## 2015-05-23 NOTE — Progress Notes (Signed)
Subjective:    Patient ID: Samuel Wheeler, male    DOB: 01-05-1946, 69 y.o.   MRN: [id number]  HPI Pt here for follow up and management of chronic medical problems which includes hyperlipidemia. He is taking medications regularly.       Patient Active Problem List   Diagnosis Date Noted  . PVC's (premature ventricular contractions) 11/01/2014  . 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 HLD (hyperlipidemia) 01/27/2013  . BPH (benign prostatic hyperplasia) 01/27/2013  . Erectile dysfunction 01/27/2013  . Genital herpes simplex 01/27/2013  . Actinic keratosis 01/27/2013  . Communicable disease 01/27/2013  . Need for zoster vaccination 01/27/2013   Outpatient Encounter Prescriptions as of 05/23/2015  Medication Sig  . albuterol (PROVENTIL HFA;VENTOLIN HFA) 108 (90 BASE) MCG/ACT inhaler Inhale 2 puffs into the lungs every 6 (six) hours as needed for wheezing or shortness of breath.  Marland Kitchen atorvastatin (LIPITOR) 20 MG tablet Take 1 tablet (20 mg total) by mouth daily.  . Cetirizine HCl (ZYRTEC ALLERGY) 10 MG CAPS Take 1 capsule (10 mg total) by mouth daily.  Marland Kitchen doxazosin (CARDURA) 2 MG tablet TAKE 1 TABLET AT BEDTIME  . finasteride (PROSCAR) 5 MG tablet TAKE 1 TABLET DAILY  . fluticasone (FLONASE) 50 MCG/ACT nasal spray USE 2 SPRAYS IN EACH NOSTRIL DAILY  . Naproxen Sodium 220 MG CAPS Take 220 mg by mouth as needed.  Marland Kitchen NIASPAN 1000 MG CR tablet TAKE 1 TABLET AT BEDTIME  . valACYclovir (VALTREX) 1000 MG tablet TAKE 1 TABLET DAILY  . Vitamin D, Ergocalciferol, (DRISDOL) 50000 UNITS CAPS capsule Take 1 capsule (50,000 Units total) by mouth every 7 (seven) days.   No facility-administered encounter medications on file as of 05/23/2015.     Review of Systems  Constitutional: Negative.   HENT: Negative.   Eyes: Negative.   Respiratory: Negative.   Cardiovascular: Negative.   Gastrointestinal: Negative.   Endocrine: Negative.   Genitourinary: Negative.   Musculoskeletal: Negative.     Skin: Negative.   Allergic/Immunologic: Negative.   Neurological: Negative.   Hematological: Negative.   Psychiatric/Behavioral: Negative.        Objective:   Physical Exam  Constitutional: He is oriented to person, place, and time. He appears well-developed and well-nourished. No distress.  HENT:  Head: Normocephalic and atraumatic.  Right Ear: External ear normal.  Left Ear: External ear normal.  Nose: Nose normal.  Mouth/Throat: Oropharynx is clear and moist. No oropharyngeal exudate.  Eyes: Conjunctivae and EOM are normal. Pupils are equal, round, and reactive to light. Right eye exhibits no discharge. Left eye exhibits no discharge. No scleral icterus.  Neck: Normal range of motion. Neck supple. No thyromegaly present.  No carotid bruits thyromegaly or anterior cervical adenopathy  Cardiovascular: Normal rate, regular rhythm and normal heart sounds.   No murmur heard. The heart is regular at 72/m  Pulmonary/Chest: Effort normal and breath sounds normal. No respiratory distress. He has no wheezes. He has no rales. He exhibits no tenderness.  No axillary adenopathy Clear anteriorly and posteriorly  Abdominal: Soft. Bowel sounds are normal. He exhibits no mass. There is no tenderness. There is no rebound and no guarding.  No inguinal adenopathy No organ enlargement or abdominal tenderness. No bruits.  Musculoskeletal: Normal range of motion. He exhibits no edema.  Lymphadenopathy:    He has no cervical adenopathy.  Neurological: He is alert and oriented to person, place, and time. He has normal reflexes. No cranial nerve deficit.  Skin: Skin is warm  and dry. No rash noted.  Psychiatric: He has a normal mood and affect. His behavior is normal. Judgment and thought content normal.  Nursing note and vitals reviewed.  BP 136/89 mmHg  Pulse 71  Temp(Src) 98 F (36.7 C) (Oral)  Ht '5\' 9"'  (1.753 m)  Wt 212 lb (96.163 kg)  BMI 31.29 kg/m2        Assessment & Plan:  1. HLD  (hyperlipidemia) -Continue with diet efforts and current cholesterol treatment - POCT CBC - BMP8+EGFR - Hepatic function panel - Lipid panel  2. BPH (benign prostatic hyperplasia) -The patient is having no symptoms with voiding. - POCT CBC  3. Vitamin D deficiency -Continue current vitamin D replacement pending results of lab work - POCT CBC - Vit D  25 hydroxy (rtn osteoporosis monitoring)  4. Family history of heart disease -Continue with aggressive therapeutic lifestyle changes weight loss and atorvastatin and Niaspan. - POCT CBC - BMP8+EGFR - Hepatic function panel  5. Family history of colon cancer -Patient had colonoscopy this spring he will automatically be due to one every 5 years and should return an FOBT once a year - POCT CBC  Patient Instructions                       Medicare Annual Wellness Visit  [facility] and the medical providers at [facility] strive to bring you the best medical care.  In doing so we not only want to address your current medical conditions and concerns but also to detect new conditions early and prevent illness, disease and health-related problems.    Medicare offers a yearly Wellness Visit which allows our clinical staff to assess your need for preventative services including immunizations, lifestyle education, counseling to decrease risk of preventable diseases and screening for fall risk and other medical concerns.    This visit is provided free of charge (no copay) for all Medicare recipients. The clinical pharmacists at [facility] have begun to conduct these Wellness Visits which will also include a thorough review of all your medications.    As you primary medical provider recommend that you make an appointment for your Annual Wellness Visit if you have not done so already this year.  You may set up this appointment before you leave today or you may call back ([phone]) and schedule an  appointment.  Please make sure when you call that you mention that you are scheduling your Annual Wellness Visit with the clinical pharmacist so that the appointment may be made for the proper length of time.     Continue current medications. Continue good therapeutic lifestyle changes which include good diet and exercise. Fall precautions discussed with patient. If an FOBT was given today- please return it to our front desk. If you are over 71 years old - you may need Prevnar 41 or the adult Pneumonia vaccine.   After your visit with [place] today you will receive a survey in the mail or online from [employer] regarding your care with [place]. Please take a moment to fill this out. Your feedback is very important to [place] as you can help [place] better understand your patient needs as well as improve your experience and satisfaction. WE CARE ABOUT YOU!!!   The patient should continue to work on his diet and eat healthily and drink plenty of water He should follow-up with cardiology as planned He should watch his sodium intake We will call with the  results of the lab work as soon as it becomes available Continue to use deep [place] off   Arrie Senate MD

## 2015-05-23 NOTE — Addendum Note (Signed)
Addended by: Selmer Dominion on: 05/23/2015 03:46 PM   Modules accepted: Orders

## 2015-05-23 NOTE — Patient Instructions (Addendum)
Medicare Annual Wellness Visit  [facility] and the medical providers at [facility] strive to bring you the best medical care.  In doing so we not only want to address your current medical conditions and concerns but also to detect new conditions early and prevent illness, disease and health-related problems.    Medicare offers a yearly Wellness Visit which allows our clinical staff to assess your need for preventative services including immunizations, lifestyle education, counseling to decrease risk of preventable diseases and screening for fall risk and other medical concerns.    This visit is provided free of charge (no copay) for all Medicare recipients. The clinical pharmacists at [facility] have begun to conduct these Wellness Visits which will also include a thorough review of all your medications.    As you primary medical provider recommend that you make an appointment for your Annual Wellness Visit if you have not done so already this year.  You may set up this appointment before you leave today or you may call back ([phone]) and schedule an appointment.  Please make sure when you call that you mention that you are scheduling your Annual Wellness Visit with the clinical pharmacist so that the appointment may be made for the proper length of time.     Continue current medications. Continue good therapeutic lifestyle changes which include good diet and exercise. Fall precautions discussed with patient. If an FOBT was given today- please return it to our front desk. If you are over 59 years old - you may need Prevnar 82 or the adult Pneumonia vaccine.   After your visit with [place] today you will receive a survey in the mail or online from [employer] regarding your care with [place]. Please take a moment to fill this out. Your feedback is very important to [place] as you can help [place] better understand your patient needs as well as  improve your experience and satisfaction. WE CARE ABOUT YOU!!!   The patient should continue to work on his diet and eat healthily and drink plenty of water He should follow-up with cardiology as planned He should watch his sodium intake We will call with the results of the lab work as soon as it becomes available Continue to use deep [place] off

## 2015-05-24 ENCOUNTER — Ambulatory Visit: Payer: Medicare Other | Admitting: Family Medicine

## 2015-05-24 LAB — BMP8+EGFR
BUN / CREAT RATIO: 31 — AB (ref 10–22)
BUN: 29 mg/dL — ABNORMAL HIGH (ref 8–27)
CO2: 26 mmol/L (ref 18–29)
CREATININE: 0.95 mg/dL (ref 0.76–1.27)
Calcium: 9.3 mg/dL (ref 8.6–10.2)
Chloride: 102 mmol/L (ref 97–108)
GFR, EST AFRICAN AMERICAN: 95 mL/min/{1.73_m2} (ref >59)
GFR, EST NON AFRICAN AMERICAN: 82 mL/min/{1.73_m2} (ref >59)
Glucose: 87 mg/dL (ref 65–99)
POTASSIUM: 4.8 mmol/L (ref 3.5–5.2)
Sodium: 142 mmol/L (ref 134–144)

## 2015-05-24 LAB — CBC WITH DIFFERENTIAL/PLATELET
BASOS ABS: 0 10*3/uL (ref 0.0–0.2)
Basos: 0 %
EOS (ABSOLUTE): 0.3 10*3/uL (ref 0.0–0.4)
Eos: 4 %
Hematocrit: 43.1 % (ref 37.5–51.0)
Hemoglobin: 15.1 g/dL (ref 12.6–17.7)
IMMATURE GRANS (ABS): 0 10*3/uL (ref 0.0–0.1)
Immature Granulocytes: 0 %
LYMPHS ABS: 3.3 10*3/uL — AB (ref 0.7–3.1)
LYMPHS: 44 %
MCH: 30.8 pg (ref 26.6–33.0)
MCHC: 35 g/dL (ref 31.5–35.7)
MCV: 88 fL (ref 79–97)
Monocytes Absolute: 0.4 10*3/uL (ref 0.1–0.9)
Monocytes: 5 %
NEUTROS ABS: 3.4 10*3/uL (ref 1.4–7.0)
Neutrophils: 47 %
PLATELETS: 211 10*3/uL (ref 150–379)
RBC: 4.91 x10E6/uL (ref 4.14–5.80)
RDW: 14.3 % (ref 12.3–15.4)
WBC: 7.4 10*3/uL (ref 3.4–10.8)

## 2015-05-24 LAB — HEPATIC FUNCTION PANEL
ALK PHOS: 69 IU/L (ref 39–117)
ALT: 27 IU/L (ref 0–44)
AST: 23 IU/L (ref 0–40)
Albumin: 4.4 g/dL (ref 3.6–4.8)
Bilirubin Total: 0.7 mg/dL (ref 0.0–1.2)
Bilirubin, Direct: 0.17 mg/dL (ref 0.00–0.40)
Total Protein: 6.7 g/dL (ref 6.0–8.5)

## 2015-05-24 LAB — VITAMIN D 25 HYDROXY (VIT D DEFICIENCY, FRACTURES): Vit D, 25-Hydroxy: 38.4 ng/mL (ref 30.0–100.0)

## 2015-05-24 LAB — LIPID PANEL
CHOL/HDL RATIO: 3.8 ratio (ref 0.0–5.0)
CHOLESTEROL TOTAL: 159 mg/dL (ref 100–199)
HDL: 42 mg/dL (ref >39)
LDL Calculated: 87 mg/dL (ref 0–99)
TRIGLYCERIDES: 152 mg/dL — AB (ref 0–149)
VLDL Cholesterol Cal: 30 mg/dL (ref 5–40)

## 2015-05-28 ENCOUNTER — Other Ambulatory Visit: Payer: Self-pay | Admitting: Family Medicine

## 2015-06-21 ENCOUNTER — Ambulatory Visit (INDEPENDENT_AMBULATORY_CARE_PROVIDER_SITE_OTHER): Payer: Medicare Other

## 2015-06-21 ENCOUNTER — Encounter: Payer: Self-pay | Admitting: Physician Assistant

## 2015-06-21 ENCOUNTER — Ambulatory Visit (INDEPENDENT_AMBULATORY_CARE_PROVIDER_SITE_OTHER): Payer: Medicare Other | Admitting: Physician Assistant

## 2015-06-21 VITALS — BP 150/62 | HR 73 | Temp 98.0°F | Ht 69.0 in | Wt 217.0 lb

## 2015-06-21 DIAGNOSIS — S93402A Sprain of unspecified ligament of left ankle, initial encounter: Secondary | ICD-10-CM | POA: Diagnosis not present

## 2015-06-21 DIAGNOSIS — M25572 Pain in left ankle and joints of left foot: Secondary | ICD-10-CM

## 2015-06-21 NOTE — Progress Notes (Signed)
   Subjective:    Patient ID: Samuel Wheeler, male    DOB: 1946-03-07, 69 y.o.   MRN: [id number]  HPI 69 y/o male presents with left ankle pain that began suddenly last night at 8pm when he stepped on a walnut and twisted his ankle. It began swelling immediately. Pain is worse with walking and movement. Has tried advil for pain relief, which relieved some of the pain.     Review of Systems  Constitutional: Negative.   HENT: Negative.   Eyes: Negative.   Respiratory: Negative.   Cardiovascular: Negative.   Gastrointestinal: Negative.   Endocrine: Negative.   Genitourinary: Negative.   Musculoskeletal:       Left ankle pain and swelling.   Skin: Negative.   Neurological: Negative.   Psychiatric/Behavioral: Negative.        Objective:   Physical Exam  Constitutional: He is oriented to person, place, and time.  Musculoskeletal: He exhibits edema and tenderness.  Decreased arom of left ankle d/t pain + erythema and ttp over left lateral malleolus   Neurological: He is alert and oriented to person, place, and time.  Psychiatric: He has a normal mood and affect. His behavior is normal. Judgment and thought content normal.          Assessment & Plan:  1. Pain in joint, ankle and foot, left  - DG Ankle Complete Left; Future  2. Ankle sprain, left, initial encounter - Rest, ice , compression, elevation - 2 Aleve BID x 1 week    RTO prn if s/s worsen   Lilly Gasser A. Benjamin Stain PA-C

## 2015-06-21 NOTE — Patient Instructions (Addendum)
Ankle Sprain An ankle sprain is an injury to the strong, fibrous tissues (ligaments) that hold the bones of your ankle joint together.  CAUSES An ankle sprain is usually caused by a fall or by twisting your ankle. Ankle sprains most commonly occur when you step on the outer edge of your foot, and your ankle turns inward. People who participate in sports are more prone to these types of injuries.  SYMPTOMS   Pain in your ankle. The pain may be present at rest or only when you are trying to stand or walk.  Swelling.  Bruising. Bruising may develop immediately or within 1 to 2 days after your injury.  Difficulty standing or walking, particularly when turning corners or changing directions. DIAGNOSIS  Your caregiver will ask you details about your injury and perform a physical exam of your ankle to determine if you have an ankle sprain. During the physical exam, your caregiver will press on and apply pressure to specific areas of your foot and ankle. Your caregiver will try to move your ankle in certain ways. An X-ray exam may be done to be sure a bone was not broken or a ligament did not separate from one of the bones in your ankle (avulsion fracture).  TREATMENT  Certain types of braces can help stabilize your ankle. Your caregiver can make a recommendation for this. Your caregiver may recommend the use of medicine for pain. If your sprain is severe, your caregiver may refer you to a surgeon who helps to restore function to parts of your skeletal system (orthopedist) or a physical therapist. [place] ice to your injury for 1-2 days or as directed by your caregiver. Applying ice helps to reduce inflammation and pain.  Put ice in a plastic bag.  Place a towel between your skin and the bag.  Leave the ice on for 15-20 minutes at a time, every 2 hours while you are awake.  Only take over-the-counter or prescription medicines for pain, discomfort, or fever as directed by  your caregiver.  Elevate your injured ankle above the level of your heart as much as possible for 2-3 days.  If your caregiver recommends crutches, use them as instructed. Gradually put weight on the affected ankle. Continue to use crutches or a cane until you can walk without feeling pain in your ankle.  If you have a plaster splint, wear the splint as directed by your caregiver. Do not rest it on anything harder than a pillow for the first 24 hours. Do not put weight on it. Do not get it wet. You may take it off to take a shower or bath.  You may have been given an elastic bandage to wear around your ankle to provide support. If the elastic bandage is too tight (you have numbness or tingling in your foot or your foot becomes cold and blue), adjust the bandage to make it comfortable.  If you have an air splint, you may blow more air into it or let air out to make it more comfortable. You may take your splint off at night and before taking a shower or bath. Wiggle your toes in the splint several times per day to decrease swelling. SEEK MEDICAL CARE IF:   You have rapidly increasing bruising or swelling.  Your toes feel extremely cold or you lose feeling in your foot.  Your pain is not relieved with medicine. SEEK IMMEDIATE MEDICAL CARE IF:  Your toes are numb or blue.  You have severe pain that is increasing. MAKE SURE YOU:   Understand these instructions.  Will watch your condition.  Will get help right away if you are not doing well or get worse. Document Released: 09/22/2005 Document Revised: 06/16/2012 Document Reviewed: 10/04/2011 [facility] Patient Information 2015 [place], [place]. This information is not intended to replace advice given to you by your health care provider. Make sure you discuss any questions you have with your health care provider.  REst, ice , ibuprofen or aleve and compression

## 2015-07-03 ENCOUNTER — Other Ambulatory Visit: Payer: Self-pay

## 2015-07-03 ENCOUNTER — Other Ambulatory Visit: Payer: Self-pay | Admitting: Family Medicine

## 2015-07-03 MED ORDER — VITAMIN D (ERGOCALCIFEROL) 1.25 MG (50000 UNIT) PO CAPS: 50000.0000 [IU] | ORAL_CAPSULE | ORAL | Status: DC

## 2015-07-03 NOTE — Telephone Encounter (Signed)
Last seen 06/21/15 Tiffany  Last Vit D 05/23/15  38.4

## 2015-07-09 ENCOUNTER — Ambulatory Visit (INDEPENDENT_AMBULATORY_CARE_PROVIDER_SITE_OTHER): Payer: Medicare Other

## 2015-07-09 DIAGNOSIS — Z23 Encounter for immunization: Secondary | ICD-10-CM | POA: Diagnosis not present

## 2015-07-14 ENCOUNTER — Other Ambulatory Visit: Payer: Self-pay | Admitting: Family Medicine

## 2015-07-24 ENCOUNTER — Other Ambulatory Visit: Payer: Self-pay | Admitting: Family Medicine

## 2015-07-24 NOTE — Telephone Encounter (Signed)
Last seen 06/21/15  Tiffany

## 2015-07-25 DIAGNOSIS — H2513 Age-related nuclear cataract, bilateral: Secondary | ICD-10-CM | POA: Diagnosis not present

## 2015-07-25 DIAGNOSIS — H40033 Anatomical narrow angle, bilateral: Secondary | ICD-10-CM | POA: Diagnosis not present

## 2015-08-03 ENCOUNTER — Encounter: Payer: Self-pay | Admitting: Gastroenterology

## 2015-08-26 ENCOUNTER — Other Ambulatory Visit: Payer: Self-pay | Admitting: Family Medicine

## 2015-09-12 ENCOUNTER — Other Ambulatory Visit: Payer: Self-pay | Admitting: Family Medicine

## 2015-09-27 ENCOUNTER — Other Ambulatory Visit: Payer: Self-pay | Admitting: Physician Assistant

## 2015-10-09 ENCOUNTER — Ambulatory Visit (INDEPENDENT_AMBULATORY_CARE_PROVIDER_SITE_OTHER): Payer: Medicare Other | Admitting: Family Medicine

## 2015-10-09 ENCOUNTER — Encounter: Payer: Self-pay | Admitting: Family Medicine

## 2015-10-09 VITALS — BP 119/74 | HR 64 | Temp 97.7°F | Ht 69.0 in | Wt 220.0 lb

## 2015-10-09 DIAGNOSIS — Z8249 Family history of ischemic heart disease and other diseases of the circulatory system: Secondary | ICD-10-CM

## 2015-10-09 DIAGNOSIS — N400 Enlarged prostate without lower urinary tract symptoms: Secondary | ICD-10-CM | POA: Diagnosis not present

## 2015-10-09 DIAGNOSIS — E785 Hyperlipidemia, unspecified: Secondary | ICD-10-CM | POA: Diagnosis not present

## 2015-10-09 DIAGNOSIS — Z800 Family history of malignant neoplasm of digestive organs: Secondary | ICD-10-CM | POA: Diagnosis not present

## 2015-10-09 DIAGNOSIS — E559 Vitamin D deficiency, unspecified: Secondary | ICD-10-CM

## 2015-10-09 LAB — POCT URINALYSIS DIPSTICK
BILIRUBIN UA: NEGATIVE
GLUCOSE UA: NEGATIVE
Ketones, UA: NEGATIVE
Leukocytes, UA: NEGATIVE
NITRITE UA: NEGATIVE
Protein, UA: NEGATIVE
RBC UA: NEGATIVE
Spec Grav, UA: 1.015
Urobilinogen, UA: NEGATIVE
pH, UA: 6.5

## 2015-10-09 LAB — POCT UA - MICROSCOPIC ONLY
BACTERIA, U MICROSCOPIC: NEGATIVE
CRYSTALS, UR, HPF, POC: NEGATIVE
Casts, Ur, LPF, POC: NEGATIVE
EPITHELIAL CELLS, URINE PER MICROSCOPY: NEGATIVE
MUCUS UA: NEGATIVE
RBC, URINE, MICROSCOPIC: NEGATIVE
WBC, Ur, HPF, POC: NEGATIVE
Yeast, UA: NEGATIVE

## 2015-10-09 MED ORDER — SILDENAFIL CITRATE 100 MG PO TABS: 50.0000 mg | ORAL_TABLET | Freq: Every day | ORAL | Status: DC | PRN

## 2015-10-09 NOTE — Progress Notes (Signed)
Subjective:    Patient ID: Samuel Wheeler, male    DOB: 13-Apr-1946, 70 y.o.   MRN: [id number]  HPI Pt here for follow up and management of chronic medical problems which includes hyperlipidemia. He is taking medications regularly. Patient is doing well today with no specific complaints. The patient has no specific complaints. He is up-to-date on his colonoscopy as this was done in April of this past year. Family history is positive for colon cancer and that his sister had this and she is still living. He has a brother that died of a stroke and both his parents died of strokes. His blood pressures at home are good and similar to what we obtained today. He denies chest pain shortness of breath trouble swallowing and heartburn indigestion nausea vomiting diarrhea or blood in the stool. 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      Patient Active Problem List   Diagnosis Date Noted  . PVC's (premature ventricular contractions) 11/01/2014  . 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 HLD (hyperlipidemia) 01/27/2013  . BPH (benign prostatic hyperplasia) 01/27/2013  . Erectile dysfunction 01/27/2013  . Genital herpes simplex 01/27/2013  . Actinic keratosis 01/27/2013  . Communicable disease 01/27/2013  . Need for zoster vaccination 01/27/2013   Outpatient Encounter Prescriptions as of 10/09/2015  Medication Sig  . atorvastatin (LIPITOR) 20 MG tablet TAKE 1 TABLET DAILY  . cetirizine (ZYRTEC) 10 MG tablet TAKE 1 TABLET DAILY  . Cetirizine HCl (ZYRTEC ALLERGY) 10 MG CAPS Take 1 capsule (10 mg total) by mouth daily.  Marland Kitchen doxazosin (CARDURA) 2 MG tablet TAKE 1 TABLET AT BEDTIME  . finasteride (PROSCAR) 5 MG tablet TAKE 1 TABLET DAILY  . fluticasone (FLONASE) 50 MCG/ACT nasal spray USE 2 SPRAYS IN EACH NOSTRIL DAILY  . Naproxen Sodium 220 MG CAPS Take 220 mg by mouth as needed.  . niacin (NIASPAN) 1000 MG CR tablet TAKE 1 TABLET AT BEDTIME    . PROAIR HFA 108 (90 BASE) MCG/ACT inhaler USE 2 INHALATIONS EVERY 6 HOURS AS NEEDED FOR WHEEZING OR SHORTNESS OF BREATH  . valACYclovir (VALTREX) 1000 MG tablet TAKE 1 TABLET DAILY  . Vitamin D, Ergocalciferol, (DRISDOL) 50000 UNITS CAPS capsule TAKE 1 CAPSULE (50,000 UNITS  TOTAL) BY MOUTH EVERY 7 (SEVEN) DAYS.   No facility-administered encounter medications on file as of 10/09/2015.      Review of Systems  Constitutional: Negative.   HENT: Negative.   Eyes: Negative.   Respiratory: Negative.   Cardiovascular: Negative.   Gastrointestinal: Negative.   Endocrine: Negative.   Genitourinary: Negative.   Musculoskeletal: Negative.   Skin: Negative.   Allergic/Immunologic: Negative.   Neurological: Negative.   Hematological: Negative.   Psychiatric/Behavioral: Negative.        Objective:   Physical Exam  Constitutional: He is oriented to person, place, and time. He appears well-developed and well-nourished. No distress.  HENT:  Head: Normocephalic and atraumatic.  Right Ear: External ear normal.  Left Ear: External ear normal.  Nose: Nose normal.  Mouth/Throat: Oropharynx is clear and moist. No oropharyngeal exudate.  Eyes: Conjunctivae and EOM are normal. Pupils are equal, round, and reactive to light. Right eye exhibits no discharge. Left eye exhibits no discharge. No scleral icterus.  Neck: Normal range of motion. Neck supple. No thyromegaly present.  No bruits or thyromegaly  Cardiovascular: Normal rate, regular rhythm, normal heart sounds and intact distal pulses.  No murmur heard. Heart is regular at 72/m  Pulmonary/Chest: Effort normal and breath sounds normal. No respiratory distress. He has no wheezes. He has no rales. He exhibits no tenderness.  Clear anteriorly and posteriorly  Abdominal: Soft. Bowel sounds are normal. He exhibits no mass. There is no tenderness. There is no rebound and no guarding.  Nontender without liver or spleen enlargement or inguinal  adenopathy  Genitourinary: Rectum normal and penis normal.  The prostate was enlarged but smooth and soft. The external genitalia were normal without inguinal hernias. The rectal exam was without masses.  Musculoskeletal: Normal range of motion. He exhibits no edema or tenderness.  Lymphadenopathy:    He has no cervical adenopathy.  Neurological: He is alert and oriented to person, place, and time. He has normal reflexes. No cranial nerve deficit.  Skin: Skin is warm and dry. No rash noted. No pallor.  Psychiatric: He has a normal mood and affect. His behavior is normal. Judgment and thought content normal.  Nursing note and vitals reviewed.    BP 119/74 mmHg  Pulse 64  Temp(Src) 97.7 F (36.5 C) (Oral)  Ht '5\' 9"'  (1.753 m)  Wt 220 lb (99.791 kg)  BMI 32.47 kg/m2      Assessment & Plan:  1. HLD (hyperlipidemia) -Continue current treatment pending results of lab work - BMP8+EGFR - CBC with Differential/Platelet - Hepatic function panel - NMR, lipoprofile  2. BPH (benign prostatic hyperplasia) -The patient is having no problems with his prostate other than frequency but no burning. - CBC with Differential/Platelet - POCT urinalysis dipstick - POCT UA - Microscopic Only - PSA, total and free  3. Vitamin D deficiency -And 10 you vitamin D replacement pending results of lab work - CBC with Differential/Platelet - VITAMIN D 25 Hydroxy (Vit-D Deficiency, Fractures)  4. Family history of heart disease -The patient's brother and both of his parents have a history of stroke. - BMP8+EGFR - CBC with Differential/Platelet  5. Family history of colon cancer -His sister has a history of colon cancer and the patient is currently up-to-date on his colonoscopies and has another one planned in 2021.  Meds ordered this encounter  Medications  . sildenafil (VIAGRA) 100 MG tablet    Sig: Take 0.5-1 tablets (50-100 mg total) by mouth daily as needed for erectile dysfunction.     Dispense:  6 tablet    Refill:  2   Patient Instructions                       Medicare Annual Wellness Visit  [facility] and the medical providers at [facility] strive to bring you the best medical care.  In doing so we not only want to address your current medical conditions and concerns but also to detect new conditions early and prevent illness, disease and health-related problems.    Medicare offers a yearly Wellness Visit which allows our clinical staff to assess your need for preventative services including immunizations, lifestyle education, counseling to decrease risk of preventable diseases and screening for fall risk and other medical concerns.    This visit is provided free of charge (no copay) for all Medicare recipients. The clinical pharmacists at [facility] have begun to conduct these Wellness Visits which will also include a thorough review of all your medications.    As you primary medical provider recommend that you make an appointment for your Annual Wellness Visit if you have not done  so already this year.  You may set up this appointment before you leave today or you may call back ([phone]) and schedule an appointment.  Please make sure when you call that you mention that you are scheduling your Annual Wellness Visit with the clinical pharmacist so that the appointment may be made for the proper length of time.     Continue current medications. Continue good therapeutic lifestyle changes which include good diet and exercise. Fall precautions discussed with patient. If an FOBT was given today- please return it to our front desk. If you are over 35 years old - you may need Prevnar 23 or the adult Pneumonia vaccine.  **Flu shots are available--- please call and schedule a FLU-CLINIC appointment**  After your visit with [place] today you will receive a survey in the mail or online from [employer] regarding your care with [place].  Please take a moment to fill this out. Your feedback is very important to [place] as you can help [place] better understand your patient needs as well as improve your experience and satisfaction. WE CARE ABOUT YOU!!!   The patient should continue with his current medicines He should drink plenty of fluids and stay well hydrated this winter keep the house as cool as possible He should return the FOBT   Arrie Senate MD

## 2015-10-09 NOTE — Patient Instructions (Addendum)
Medicare Annual Wellness Visit  [facility] and the medical providers at [facility] strive to bring you the best medical care.  In doing so we not only want to address your current medical conditions and concerns but also to detect new conditions early and prevent illness, disease and health-related problems.    Medicare offers a yearly Wellness Visit which allows our clinical staff to assess your need for preventative services including immunizations, lifestyle education, counseling to decrease risk of preventable diseases and screening for fall risk and other medical concerns.    This visit is provided free of charge (no copay) for all Medicare recipients. The clinical pharmacists at [facility] have begun to conduct these Wellness Visits which will also include a thorough review of all your medications.    As you primary medical provider recommend that you make an appointment for your Annual Wellness Visit if you have not done so already this year.  You may set up this appointment before you leave today or you may call back [phone] and schedule an appointment.  Please make sure when you call that you mention that you are scheduling your Annual Wellness Visit with the clinical pharmacist so that the appointment may be made for the proper length of time.     Continue current medications. Continue good therapeutic lifestyle changes which include good diet and exercise. Fall precautions discussed with patient. If an FOBT was given today- please return it to our front desk. If you are over 76 years old - you may need Prevnar 29 or the adult Pneumonia vaccine.  **Flu shots are available--- please call and schedule a FLU-CLINIC appointment**  After your visit with [place] today you will receive a survey in the mail or online from [employer] regarding your care with [place]. Please take a moment to fill this out. Your feedback is very  important to [place] as you can help [place] better understand your patient needs as well as improve your experience and satisfaction. WE CARE ABOUT YOU!!!   The patient should continue with his current medicines He should drink plenty of fluids and stay well hydrated this winter keep the house as cool as possible He should return the FOBT

## 2015-10-10 ENCOUNTER — Other Ambulatory Visit: Payer: Medicare Other

## 2015-10-10 DIAGNOSIS — L570 Actinic keratosis: Secondary | ICD-10-CM | POA: Diagnosis not present

## 2015-10-10 DIAGNOSIS — Z1212 Encounter for screening for malignant neoplasm of rectum: Secondary | ICD-10-CM

## 2015-10-10 DIAGNOSIS — D485 Neoplasm of uncertain behavior of skin: Secondary | ICD-10-CM | POA: Diagnosis not present

## 2015-10-10 DIAGNOSIS — Z85828 Personal history of other malignant neoplasm of skin: Secondary | ICD-10-CM | POA: Diagnosis not present

## 2015-10-10 DIAGNOSIS — L814 Other melanin hyperpigmentation: Secondary | ICD-10-CM | POA: Diagnosis not present

## 2015-10-10 DIAGNOSIS — L820 Inflamed seborrheic keratosis: Secondary | ICD-10-CM | POA: Diagnosis not present

## 2015-10-10 LAB — NMR, LIPOPROFILE
CHOLESTEROL: 152 mg/dL (ref 100–199)
HDL Cholesterol by NMR: 44 mg/dL (ref >39)
HDL Particle Number: 29.8 umol/L — ABNORMAL LOW (ref >=30.5)
LDL PARTICLE NUMBER: 1071 nmol/L — AB (ref <1000)
LDL Size: 20.5 nm (ref >20.5)
LDL-C: 86 mg/dL (ref 0–99)
LP-IR SCORE: 71 — AB (ref <=45)
Small LDL Particle Number: 528 nmol/L — ABNORMAL HIGH (ref <=527)
Triglycerides by NMR: 109 mg/dL (ref 0–149)

## 2015-10-10 LAB — CBC WITH DIFFERENTIAL/PLATELET
BASOS ABS: 0 10*3/uL (ref 0.0–0.2)
Basos: 1 %
EOS (ABSOLUTE): 0.3 10*3/uL (ref 0.0–0.4)
Eos: 5 %
HEMOGLOBIN: 15.4 g/dL (ref 12.6–17.7)
Hematocrit: 44.3 % (ref 37.5–51.0)
IMMATURE GRANS (ABS): 0 10*3/uL (ref 0.0–0.1)
IMMATURE GRANULOCYTES: 0 %
Lymphocytes Absolute: 2.5 10*3/uL (ref 0.7–3.1)
Lymphs: 39 %
MCH: 30.8 pg (ref 26.6–33.0)
MCHC: 34.8 g/dL (ref 31.5–35.7)
MCV: 89 fL (ref 79–97)
MONOCYTES: 6 %
Monocytes Absolute: 0.4 10*3/uL (ref 0.1–0.9)
NEUTROS PCT: 49 %
Neutrophils Absolute: 3.2 10*3/uL (ref 1.4–7.0)
Platelets: 218 10*3/uL (ref 150–379)
RBC: 5 x10E6/uL (ref 4.14–5.80)
RDW: 13.8 % (ref 12.3–15.4)
WBC: 6.4 10*3/uL (ref 3.4–10.8)

## 2015-10-10 LAB — PSA, TOTAL AND FREE
PSA FREE PCT: 12 %
PSA, Free: 0.12 ng/mL
Prostate Specific Ag, Serum: 1 ng/mL (ref 0.0–4.0)

## 2015-10-10 LAB — HEPATIC FUNCTION PANEL
ALBUMIN: 3.9 g/dL (ref 3.6–4.8)
ALK PHOS: 73 IU/L (ref 39–117)
ALT: 30 IU/L (ref 0–44)
AST: 21 IU/L (ref 0–40)
BILIRUBIN TOTAL: 0.8 mg/dL (ref 0.0–1.2)
Bilirubin, Direct: 0.17 mg/dL (ref 0.00–0.40)
TOTAL PROTEIN: 6.6 g/dL (ref 6.0–8.5)

## 2015-10-10 LAB — BMP8+EGFR
BUN/Creatinine Ratio: 16 (ref 10–22)
BUN: 16 mg/dL (ref 8–27)
CALCIUM: 8.8 mg/dL (ref 8.6–10.2)
CHLORIDE: 101 mmol/L (ref 96–106)
CO2: 24 mmol/L (ref 18–29)
CREATININE: 0.97 mg/dL (ref 0.76–1.27)
GFR calc Af Amer: 92 mL/min/{1.73_m2} (ref >59)
GFR calc non Af Amer: 80 mL/min/{1.73_m2} (ref >59)
GLUCOSE: 91 mg/dL (ref 65–99)
Potassium: 4.2 mmol/L (ref 3.5–5.2)
Sodium: 141 mmol/L (ref 134–144)

## 2015-10-10 LAB — VITAMIN D 25 HYDROXY (VIT D DEFICIENCY, FRACTURES): Vit D, 25-Hydroxy: 38.2 ng/mL (ref 30.0–100.0)

## 2015-10-12 ENCOUNTER — Other Ambulatory Visit: Payer: Self-pay | Admitting: Family Medicine

## 2015-10-12 LAB — FECAL OCCULT BLOOD, IMMUNOCHEMICAL: Fecal Occult Bld: NEGATIVE

## 2015-11-10 ENCOUNTER — Other Ambulatory Visit: Payer: Self-pay | Admitting: Family Medicine

## 2015-11-22 ENCOUNTER — Other Ambulatory Visit: Payer: Self-pay | Admitting: Family Medicine

## 2016-01-01 ENCOUNTER — Encounter: Payer: Self-pay | Admitting: Family Medicine

## 2016-01-01 ENCOUNTER — Ambulatory Visit (INDEPENDENT_AMBULATORY_CARE_PROVIDER_SITE_OTHER): Payer: Medicare Other

## 2016-01-01 ENCOUNTER — Ambulatory Visit (INDEPENDENT_AMBULATORY_CARE_PROVIDER_SITE_OTHER): Payer: Medicare Other | Admitting: Family Medicine

## 2016-01-01 VITALS — BP 128/74 | HR 69 | Temp 98.6°F | Ht 69.0 in | Wt 215.0 lb

## 2016-01-01 DIAGNOSIS — R0602 Shortness of breath: Secondary | ICD-10-CM | POA: Diagnosis not present

## 2016-01-01 MED ORDER — PREDNISONE 50 MG PO TABS: 50.0000 mg | ORAL_TABLET | Freq: Every day | ORAL | Status: DC

## 2016-01-01 MED ORDER — AMOXICILLIN-POT CLAVULANATE 875-125 MG PO TABS: 1.0000 | ORAL_TABLET | Freq: Two times a day (BID) | ORAL | Status: DC

## 2016-01-01 NOTE — Patient Instructions (Signed)
Great to meet you!  I am treating you for pneumonia with augmentin, prednisone for the wheezing (prednisone you may want to start tomorrow morning as it can interrupt your sleep)  Please call or come back if you have any concerns  Community-Acquired Pneumonia, Adult Pneumonia is an infection of the lungs. There are different types of pneumonia. One type can develop while a person is in a hospital. A different type, called community-acquired pneumonia, develops in people who are not, or have not recently been, in the hospital or other health care facility.  CAUSES Pneumonia may be caused by bacteria, viruses, or funguses. Community-acquired pneumonia is often caused by Streptococcus pneumonia bacteria. These bacteria are often passed from one person to another by breathing in droplets from the cough or sneeze of an infected person. RISK FACTORS The condition is more likely to develop in:  People who havechronic diseases, such as chronic obstructive pulmonary disease (COPD), asthma, congestive heart failure, cystic fibrosis, diabetes, or kidney disease.  People who haveearly-stage or late-stage HIV.  People who havesickle cell disease.  People who havehad their spleen removed (splenectomy).  People who havepoor Human resources officer.  People who havemedical conditions that increase the risk of breathing in (aspirating) secretions their own mouth and nose.   People who havea weakened immune system (immunocompromised).  People who smoke.  People whotravel to areas where pneumonia-causing germs commonly exist.  People whoare around animal habitats or animals that have pneumonia-causing germs, including birds, bats, rabbits, cats, and farm animals. SYMPTOMS Symptoms of this condition include:  Adry cough.  A wet (productive) cough.  Fever.  Sweating.  Chest pain, especially when breathing deeply or coughing.  Rapid breathing or difficulty breathing.  Shortness of  breath.  Shaking chills.  Fatigue.  Muscle aches. DIAGNOSIS Your health care provider will take a medical history and perform a physical exam. You may also have other tests, including:  Imaging studies of your chest, including X-rays.  Tests to check your blood oxygen level and other blood gases.  Other tests on blood, mucus (sputum), fluid around your lungs (pleural fluid), and urine. If your pneumonia is severe, other tests may be done to identify the specific cause of your illness. TREATMENT The type of treatment that you receive depends on many factors, such as the cause of your pneumonia, the medicines you take, and other medical conditions that you have. For most adults, treatment and recovery from pneumonia may occur at home. In some cases, treatment must happen in a hospital. Treatment may include:  Antibiotic medicines, if the pneumonia was caused by bacteria.  Antiviral medicines, if the pneumonia was caused by a virus.  Medicines that are given by mouth or through an IV tube.  Oxygen.  Respiratory therapy. Although rare, treating severe pneumonia may include:  Mechanical ventilation. This is done if you are not breathing well on your own and you cannot maintain a safe blood oxygen level.  Thoracentesis. This procedureremoves fluid around one lung or both lungs to help you breathe better. HOME CARE INSTRUCTIONS  Take over-the-counter and prescription medicines only as told by your health care provider.  Only takecough medicine if you are losing sleep. Understand that cough medicine can prevent your body's natural ability to remove mucus from your lungs.  If you were prescribed an antibiotic medicine, take it as told by your health care provider. Do not stop taking the antibiotic even if you start to feel better.  Sleep in a semi-upright position at night. Try  sleeping in a reclining chair, or place a few pillows under your head.  Do not use tobacco products,  including cigarettes, chewing tobacco, and e-cigarettes. If you need help quitting, ask your health care provider.  Drink enough water to keep your urine clear or pale yellow. This will help to thin out mucus secretions in your lungs. PREVENTION There are ways that you can decrease your risk of developing community-acquired pneumonia. Consider getting a pneumococcal vaccine if:  You are older than 69 years of age.  You are older than 69 years of age and are undergoing cancer treatment, have chronic lung disease, or have other medical conditions that affect your immune system. Ask your health care provider if this applies to you. There are different types and schedules of pneumococcal vaccines. Ask your health care provider which vaccination option is best for you. You may also prevent community-acquired pneumonia if you take these actions:  Get an influenza vaccine every year. Ask your health care provider which type of influenza vaccine is best for you.  Go to the dentist on a regular basis.  Wash your hands often. Use hand sanitizer if soap and water are not available. SEEK MEDICAL CARE IF:  You have a fever.  You are losing sleep because you cannot control your cough with cough medicine. SEEK IMMEDIATE MEDICAL CARE IF:  You have worsening shortness of breath.  You have increased chest pain.  Your sickness becomes worse, especially if you are an older adult or have a weakened immune system.  You cough up blood.   This information is not intended to replace advice given to you by your health care provider. Make sure you discuss any questions you have with your health care provider.   Document Released: 09/22/2005 Document Revised: 06/13/2015 Document Reviewed: 01/17/2015 Elsevier Interactive Patient Education [employer].

## 2016-01-01 NOTE — Progress Notes (Signed)
   HPI  Patient presents today acute illness.  He explains that he said his wife in the hospital he was admitted for pneumonia, over the last week. She's just come home. Over the last 1 week's 7 days, he's had cough, head congestion, nasal congestion, chest congestion and worsening wheezing. He's also had mild dyspnea. He denies chest pain. He is tolerating food and fluids normally. He has not had any medications.   PMH: Smoking status noted ROS: Per HPI  Objective: BP 128/74 mmHg  Pulse 69  Temp(Src) 98.6 F (37 C) (Oral)  Ht 5\' 9"  (1.753 m)  Wt 215 lb (97.523 kg)  BMI 31.74 kg/m2 Gen: NAD, alert, cooperative with exam HEENT: NCAT CV: RRR, good S1/S2, no murmur Resp: nonlabored, exp course sounds with some scattered wheeze throughout Ext: No edema, warm Neuro: Alert and oriented, No gross deficits  CXR- L lower lung infiltrate suspected, Awaiting radiology read   Assessment and plan:  # CAP Treat with augmentin CXR without overt consolidation but lung exam is very concerning, with 1 week of symptoms will treat aggressively Prednisone with wheezing, continue albuterol Discussed supportive care, RTC with any concerns or questions.        Orders Placed This Encounter  Procedures  . DG Chest 2 View    Standing Status: Future     Number of Occurrences: 1     Standing Expiration Date: 03/02/2017    Order Specific Question:  Reason for Exam (SYMPTOM  OR DIAGNOSIS REQUIRED)    Answer:  short of breath    Order Specific Question:  Preferred imaging location?    Answer:  Internal    Meds ordered this encounter  Medications  . amoxicillin-clavulanate (AUGMENTIN) 875-125 MG tablet    Sig: Take 1 tablet by mouth 2 (two) times daily.    Dispense:  20 tablet    Refill:  0  . predniSONE (DELTASONE) 50 MG tablet    Sig: Take 1 tablet (50 mg total) by mouth daily with breakfast.    Dispense:  5 tablet    Refill:  0    Laroy Apple, MD [facility] 01/01/2016, 5:03 PM

## 2016-02-27 ENCOUNTER — Ambulatory Visit (INDEPENDENT_AMBULATORY_CARE_PROVIDER_SITE_OTHER): Payer: Medicare Other | Admitting: Family Medicine

## 2016-02-27 ENCOUNTER — Encounter: Payer: Self-pay | Admitting: Family Medicine

## 2016-02-27 VITALS — BP 116/73 | HR 64 | Temp 97.6°F | Ht 69.0 in | Wt 219.0 lb

## 2016-02-27 DIAGNOSIS — D492 Neoplasm of unspecified behavior of bone, soft tissue, and skin: Secondary | ICD-10-CM

## 2016-02-27 DIAGNOSIS — I493 Ventricular premature depolarization: Secondary | ICD-10-CM | POA: Diagnosis not present

## 2016-02-27 DIAGNOSIS — N400 Enlarged prostate without lower urinary tract symptoms: Secondary | ICD-10-CM

## 2016-02-27 DIAGNOSIS — N5201 Erectile dysfunction due to arterial insufficiency: Secondary | ICD-10-CM

## 2016-02-27 DIAGNOSIS — J301 Allergic rhinitis due to pollen: Secondary | ICD-10-CM | POA: Diagnosis not present

## 2016-02-27 DIAGNOSIS — E559 Vitamin D deficiency, unspecified: Secondary | ICD-10-CM | POA: Diagnosis not present

## 2016-02-27 DIAGNOSIS — E785 Hyperlipidemia, unspecified: Secondary | ICD-10-CM

## 2016-02-27 DIAGNOSIS — Z8249 Family history of ischemic heart disease and other diseases of the circulatory system: Secondary | ICD-10-CM

## 2016-02-27 DIAGNOSIS — J9801 Acute bronchospasm: Secondary | ICD-10-CM

## 2016-02-27 DIAGNOSIS — Z800 Family history of malignant neoplasm of digestive organs: Secondary | ICD-10-CM

## 2016-02-27 DIAGNOSIS — D485 Neoplasm of uncertain behavior of skin: Secondary | ICD-10-CM

## 2016-02-27 MED ORDER — DOXAZOSIN MESYLATE 2 MG PO TABS: 2.0000 mg | ORAL_TABLET | Freq: Every day | ORAL | Status: DC

## 2016-02-27 MED ORDER — NIACIN ER (ANTIHYPERLIPIDEMIC) 1000 MG PO TBCR: 1000.0000 mg | EXTENDED_RELEASE_TABLET | Freq: Every day | ORAL | Status: DC

## 2016-02-27 MED ORDER — VITAMIN D (ERGOCALCIFEROL) 1.25 MG (50000 UNIT) PO CAPS: ORAL_CAPSULE | ORAL | Status: DC

## 2016-02-27 MED ORDER — CETIRIZINE HCL 10 MG PO TABS: 10.0000 mg | ORAL_TABLET | Freq: Every day | ORAL | Status: DC

## 2016-02-27 MED ORDER — FLUTICASONE PROPIONATE 50 MCG/ACT NA SUSP: 2.0000 | Freq: Every day | NASAL | Status: DC

## 2016-02-27 MED ORDER — ATORVASTATIN CALCIUM 20 MG PO TABS: 20.0000 mg | ORAL_TABLET | Freq: Every day | ORAL | Status: DC

## 2016-02-27 MED ORDER — ALBUTEROL SULFATE HFA 108 (90 BASE) MCG/ACT IN AERS: INHALATION_SPRAY | RESPIRATORY_TRACT | Status: DC

## 2016-02-27 MED ORDER — FINASTERIDE 5 MG PO TABS: 5.0000 mg | ORAL_TABLET | Freq: Every day | ORAL | Status: DC

## 2016-02-27 MED ORDER — VALACYCLOVIR HCL 1 G PO TABS: 1000.0000 mg | ORAL_TABLET | Freq: Every day | ORAL | Status: DC

## 2016-02-27 NOTE — Patient Instructions (Addendum)
Medicare Annual Wellness Visit  [facility] and the medical providers at [facility] strive to bring you the best medical care.  In doing so we not only want to address your current medical conditions and concerns but also to detect new conditions early and prevent illness, disease and health-related problems.    Medicare offers a yearly Wellness Visit which allows our clinical staff to assess your need for preventative services including immunizations, lifestyle education, counseling to decrease risk of preventable diseases and screening for fall risk and other medical concerns.    This visit is provided free of charge (no copay) for all Medicare recipients. The clinical pharmacists at [facility] have begun to conduct these Wellness Visits which will also include a thorough review of all your medications.    As you primary medical provider recommend that you make an appointment for your Annual Wellness Visit if you have not done so already this year.  You may set up this appointment before you leave today or you may call back [phone] and schedule an appointment.  Please make sure when you call that you mention that you are scheduling your Annual Wellness Visit with the clinical pharmacist so that the appointment may be made for the proper length of time.     Continue current medications. Continue good therapeutic lifestyle changes which include good diet and exercise. Fall precautions discussed with patient. If an FOBT was given today- please return it to our front desk. If you are over 66 years old - you may need Prevnar 63 or the adult Pneumonia vaccine.  **Flu shots are available--- please call and schedule a FLU-CLINIC appointment**  After your visit with [place] today you will receive a survey in the mail or online from [employer] regarding your care with [place]. Please take a moment to fill this out. Your feedback is very  important to [place] as you can help [place] better understand your patient needs as well as improve your experience and satisfaction. WE CARE ABOUT YOU!!!   He should continue to follow-up aggressive therapeutic lifestyle changes which include diet and exercise. He should make every effort to lose some of the weight that he is put on since his last visit here. Reducing breads potatoes and sugar in the diet will help this considerably. 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

## 2016-02-27 NOTE — Progress Notes (Signed)
Subjective:    Patient ID: Samuel Wheeler, male    DOB: 1946-02-15, 70 y.o.   MRN: [id number]  HPI Pt here for follow up and management of chronic medical problems which includes hyperlipidemia. He is taking medications regularly.Samuel Wheeler is doing well and has no specific complaints or problems. He is requesting refills on several of his medicines. He will get lab work today. His vital signs are stable. His weight is up 4 pounds and his BMI is 31.8. The patient denies chest pain chest tightness shortness of breath other than when he had pneumonia this winter and this is much better. He does still have some wheezing at times and uses his albuterol inhaler as needed for that. He denies any trouble with swallowing heartburn indigestion nausea vomiting diarrhea or blood in the stool. He is passing his water without problems with no burning pain or frequency and no blood in the urine. He has no musculoskeletal symptoms. He is [place] physically [place] and is aware that tics or more prevailent.     Patient Active Problem List   Diagnosis Date Noted  . PVC's (premature ventricular contractions) 11/01/2014  . 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 HLD (hyperlipidemia) 01/27/2013  . BPH (benign prostatic hyperplasia) 01/27/2013  . Erectile dysfunction 01/27/2013  . Genital herpes simplex 01/27/2013  . Actinic keratosis 01/27/2013  . Need for zoster vaccination 01/27/2013   Outpatient Encounter Prescriptions as of 02/27/2016  Medication Sig  . albuterol (PROAIR HFA) 108 (90 Base) MCG/ACT inhaler USE 2 INHALATIONS EVERY 6 HOURS AS NEEDED FOR WHEEZING OR SHORTNESS OF BREATH  . atorvastatin (LIPITOR) 20 MG tablet Take 1 tablet (20 mg total) by mouth daily.  . cetirizine (ZYRTEC) 10 MG tablet Take 1 tablet (10 mg total) by mouth daily.  . Cetirizine HCl (ZYRTEC ALLERGY) 10 MG CAPS Take 1 capsule (10 mg total) by mouth daily.  Marland Kitchen doxazosin (CARDURA) 2 MG tablet Take 1 tablet (2 mg total) by mouth at  bedtime.  . finasteride (PROSCAR) 5 MG tablet Take 1 tablet (5 mg total) by mouth daily.  . fluticasone (FLONASE) 50 MCG/ACT nasal spray Place 2 sprays into both nostrils daily.  . Naproxen Sodium 220 MG CAPS Take 220 mg by mouth as needed.  . niacin (NIASPAN) 1000 MG CR tablet Take 1 tablet (1,000 mg total) by mouth at bedtime.  . valACYclovir (VALTREX) 1000 MG tablet Take 1 tablet (1,000 mg total) by mouth daily.  . Vitamin D, Ergocalciferol, (DRISDOL) 50000 units CAPS capsule TAKE 1 CAPSULE (50,000 UNITS  TOTAL) BY MOUTH EVERY 7 (SEVEN) DAYS.  . [DISCONTINUED] atorvastatin (LIPITOR) 20 MG tablet TAKE 1 TABLET DAILY  . [DISCONTINUED] cetirizine (ZYRTEC) 10 MG tablet TAKE 1 TABLET DAILY  . [DISCONTINUED] doxazosin (CARDURA) 2 MG tablet TAKE 1 TABLET AT BEDTIME  . [DISCONTINUED] finasteride (PROSCAR) 5 MG tablet TAKE 1 TABLET DAILY  . [DISCONTINUED] fluticasone (FLONASE) 50 MCG/ACT nasal spray USE 2 SPRAYS IN EACH NOSTRIL DAILY  . [DISCONTINUED] niacin (NIASPAN) 1000 MG CR tablet TAKE 1 TABLET AT BEDTIME  . [DISCONTINUED] PROAIR HFA 108 (90 BASE) MCG/ACT inhaler USE 2 INHALATIONS EVERY 6 HOURS AS NEEDED FOR WHEEZING OR SHORTNESS OF BREATH  . [DISCONTINUED] valACYclovir (VALTREX) 1000 MG tablet TAKE 1 TABLET DAILY  . [DISCONTINUED] Vitamin D, Ergocalciferol, (DRISDOL) 50000 UNITS CAPS capsule TAKE 1 CAPSULE (50,000 UNITS  TOTAL) BY MOUTH EVERY 7 (SEVEN) DAYS.  . [DISCONTINUED] amoxicillin-clavulanate (AUGMENTIN) 875-125 MG tablet Take 1 tablet by mouth 2 (  two) times daily.  . [DISCONTINUED] predniSONE (DELTASONE) 50 MG tablet Take 1 tablet (50 mg total) by mouth daily with breakfast.  . [DISCONTINUED] sildenafil (VIAGRA) 100 MG tablet Take 0.5-1 tablets (50-100 mg total) by mouth daily as needed for erectile dysfunction.   No facility-administered encounter medications on file as of 02/27/2016.       Review of Systems  Constitutional: Negative.   HENT: Negative.   Eyes: Negative.     Respiratory: Negative.   Cardiovascular: Negative.   Gastrointestinal: Negative.   Endocrine: Negative.   Genitourinary: Negative.   Musculoskeletal: Negative.   Skin: Negative.   Allergic/Immunologic: Negative.   Neurological: Negative.   Hematological: Negative.   Psychiatric/Behavioral: Negative.        Objective:   Physical Exam  Constitutional: He is oriented to person, place, and time. He appears well-developed and well-nourished. No distress.  HENT:  Head: Normocephalic and atraumatic.  Right Ear: External ear normal.  Left Ear: External ear normal.  Mouth/Throat: Oropharynx is clear and moist. No oropharyngeal exudate.  Slight nasal congestion  Eyes: Conjunctivae and EOM are normal. Pupils are equal, round, and reactive to light. Right eye exhibits no discharge. Left eye exhibits no discharge. No scleral icterus.  Neck: Normal range of motion. Neck supple. No thyromegaly present.  Cardiovascular: Normal rate, regular rhythm, normal heart sounds and intact distal pulses.   No murmur heard. The heart is regular at 60/m  Pulmonary/Chest: Effort normal and breath sounds normal. No respiratory distress. He has no wheezes. He has no rales. He exhibits no tenderness.  Clear anteriorly and posteriorly and no wheezes  Abdominal: Soft. Bowel sounds are normal. He exhibits no mass. There is no tenderness. There is no rebound and no guarding.  Musculoskeletal: Normal range of motion. He exhibits no edema.  Lymphadenopathy:    He has no cervical adenopathy.  Neurological: He is alert and oriented to person, place, and time. He has normal reflexes. No cranial nerve deficit.  Skin: Skin is warm and dry. No rash noted. No erythema.  Psychiatric: He has a normal mood and affect. His behavior is normal. Judgment and thought content normal.  Nursing note and vitals reviewed.  BP 116/73 mmHg  Pulse 64  Temp(Src) 97.6 F (36.4 C) (Oral)  Ht '5\' 9"'  (1.753 m)  Wt 219 lb (99.338 kg)   BMI 32.33 kg/m2        Assessment & Plan:  1. HLD (hyperlipidemia) -Continue with current treatment and aggressive therapeutic lifestyle changes pending results of lab work - BMP8+EGFR - CBC with Differential/Platelet - Hepatic function panel - NMR, lipoprofile  2. BPH (benign prostatic hyperplasia) -The patient is having no symptoms with this - CBC with Differential/Platelet  3. Vitamin D deficiency -Continue with current treatment - CBC with Differential/Platelet - VITAMIN D 25 Hydroxy (Vit-D Deficiency, Fractures)  4. Family history of heart disease -The patient's parents both died of strokes and had heart disease. His sister has had heart problems but no heart attack. The patient had a stress test a little over year ago and apparently did well with this. - BMP8+EGFR - CBC with Differential/Platelet - Hepatic function panel - NMR, lipoprofile  5. Family history of colon cancer -He gets colonoscopies every 5 years in the next will be due in 2021. - CBC with Differential/Platelet  6. Erectile dysfunction due to arterial insufficiency -Continue with aggressive therapeutic lifestyle changes  7. PVC's (premature ventricular contractions) -He has seen the cardiologist about this and everything was good.  The patient has no complaints with this today.  8. 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  9. Bronchospasm -He uses his albuterol inhaler on an as-needed basis.  10. Allergic rhinitis due to pollen -He will continue with Flonase and respiratory protection  Meds ordered this encounter  Medications  . atorvastatin (LIPITOR) 20 MG tablet    Sig: Take 1 tablet (20 mg total) by mouth daily.    Dispense:  90 tablet    Refill:  3  . cetirizine (ZYRTEC) 10 MG tablet    Sig: Take 1 tablet (10 mg total) by mouth daily.    Dispense:  90 tablet    Refill:  3  . doxazosin (CARDURA) 2 MG tablet     Sig: Take 1 tablet (2 mg total) by mouth at bedtime.    Dispense:  90 tablet    Refill:  3  . finasteride (PROSCAR) 5 MG tablet    Sig: Take 1 tablet (5 mg total) by mouth daily.    Dispense:  90 tablet    Refill:  3  . fluticasone (FLONASE) 50 MCG/ACT nasal spray    Sig: Place 2 sprays into both nostrils daily.    Dispense:  48 g    Refill:  3  . niacin (NIASPAN) 1000 MG CR tablet    Sig: Take 1 tablet (1,000 mg total) by mouth at bedtime.    Dispense:  90 tablet    Refill:  3  . albuterol (PROAIR HFA) 108 (90 Base) MCG/ACT inhaler    Sig: USE 2 INHALATIONS EVERY 6 HOURS AS NEEDED FOR WHEEZING OR SHORTNESS OF BREATH    Dispense:  54 g    Refill:  3  . Vitamin D, Ergocalciferol, (DRISDOL) 50000 units CAPS capsule    Sig: TAKE 1 CAPSULE (50,000 UNITS  TOTAL) BY MOUTH EVERY 7 (SEVEN) DAYS.    Dispense:  12 capsule    Refill:  3  . valACYclovir (VALTREX) 1000 MG tablet    Sig: Take 1 tablet (1,000 mg total) by mouth daily.    Dispense:  90 tablet    Refill:  3   Patient Instructions                       Medicare Annual Wellness Visit  [facility] and the medical providers at [facility] strive to bring you the best medical care.  In doing so we not only want to address your current medical conditions and concerns but also to detect new conditions early and prevent illness, disease and health-related problems.    Medicare offers a yearly Wellness Visit which allows our clinical staff to assess your need for preventative services including immunizations, lifestyle education, counseling to decrease risk of preventable diseases and screening for fall risk and other medical concerns.    This visit is provided free of charge (no copay) for all Medicare recipients. The clinical pharmacists at [facility] have begun to conduct these Wellness Visits which will also include a thorough review of all your medications.    As you primary medical  provider recommend that you make an appointment for your Annual Wellness Visit if you have not done so already this year.  You may set up this appointment before you leave today or you may call back ([phone]) and schedule an appointment.  Please make sure when you  call that you mention that you are scheduling your Annual Wellness Visit with the clinical pharmacist so that the appointment may be made for the proper length of time.     Continue current medications. Continue good therapeutic lifestyle changes which include good diet and exercise. Fall precautions discussed with patient. If an FOBT was given today- please return it to our front desk. If you are over 11 years old - you may need Prevnar 27 or the adult Pneumonia vaccine.  **Flu shots are available--- please call and schedule a FLU-CLINIC appointment**  After your visit with [place] today you will receive a survey in the mail or online from [employer] regarding your care with [place]. Please take a moment to fill this out. Your feedback is very important to [place] as you can help [place] better understand your patient needs as well as improve your experience and satisfaction. WE CARE ABOUT YOU!!!   He should continue to follow-up aggressive therapeutic lifestyle changes which include diet and exercise. He should make every effort to lose some of the weight that he is put on since his last visit here. Reducing breads potatoes and sugar in the diet will help this considerably. In working outside and in environments that are more allergenic he should wear respiratory protection and consider using his albuterol inhaler prior to his working in this environment He should reduce the use of his sedating antihistamines and switched to as those that are nonsedating like Claritin or Allegra He should continue to use his Flonase He should use deep [place] off and protect himself from T6 when out in the woods and yard area    Arrie Senate MD

## 2016-02-28 ENCOUNTER — Encounter: Payer: Self-pay | Admitting: *Deleted

## 2016-02-28 LAB — HEPATIC FUNCTION PANEL
ALBUMIN: 4 g/dL (ref 3.6–4.8)
ALT: 24 IU/L (ref 0–44)
AST: 19 IU/L (ref 0–40)
Alkaline Phosphatase: 70 IU/L (ref 39–117)
Bilirubin Total: 0.6 mg/dL (ref 0.0–1.2)
Bilirubin, Direct: 0.17 mg/dL (ref 0.00–0.40)
TOTAL PROTEIN: 6.4 g/dL (ref 6.0–8.5)

## 2016-02-28 LAB — CBC WITH DIFFERENTIAL/PLATELET
BASOS ABS: 0 10*3/uL (ref 0.0–0.2)
Basos: 0 %
EOS (ABSOLUTE): 0.3 10*3/uL (ref 0.0–0.4)
Eos: 4 %
Hematocrit: 41.7 % (ref 37.5–51.0)
Hemoglobin: 14.2 g/dL (ref 12.6–17.7)
IMMATURE GRANS (ABS): 0 10*3/uL (ref 0.0–0.1)
IMMATURE GRANULOCYTES: 0 %
LYMPHS: 43 %
Lymphocytes Absolute: 2.9 10*3/uL (ref 0.7–3.1)
MCH: 30.5 pg (ref 26.6–33.0)
MCHC: 34.1 g/dL (ref 31.5–35.7)
MCV: 90 fL (ref 79–97)
MONOCYTES: 7 %
Monocytes Absolute: 0.5 10*3/uL (ref 0.1–0.9)
NEUTROS PCT: 46 %
Neutrophils Absolute: 3.1 10*3/uL (ref 1.4–7.0)
Platelets: 197 10*3/uL (ref 150–379)
RBC: 4.66 x10E6/uL (ref 4.14–5.80)
RDW: 14.5 % (ref 12.3–15.4)
WBC: 6.7 10*3/uL (ref 3.4–10.8)

## 2016-02-28 LAB — NMR, LIPOPROFILE
CHOLESTEROL: 129 mg/dL (ref 100–199)
HDL CHOLESTEROL BY NMR: 45 mg/dL (ref >39)
HDL PARTICLE NUMBER: 28.3 umol/L — AB (ref >=30.5)
LDL PARTICLE NUMBER: 775 nmol/L (ref <1000)
LDL Size: 20.9 nm (ref >20.5)
LDL-C: 67 mg/dL (ref 0–99)
LP-IR Score: 62 — ABNORMAL HIGH (ref <=45)
Small LDL Particle Number: 239 nmol/L (ref <=527)
Triglycerides by NMR: 86 mg/dL (ref 0–149)

## 2016-02-28 LAB — BMP8+EGFR
BUN / CREAT RATIO: 24 (ref 10–24)
BUN: 19 mg/dL (ref 8–27)
CHLORIDE: 105 mmol/L (ref 96–106)
CO2: 23 mmol/L (ref 18–29)
Calcium: 8.6 mg/dL (ref 8.6–10.2)
Creatinine, Ser: 0.79 mg/dL (ref 0.76–1.27)
GFR calc Af Amer: 106 mL/min/{1.73_m2} (ref >59)
GFR calc non Af Amer: 92 mL/min/{1.73_m2} (ref >59)
GLUCOSE: 98 mg/dL (ref 65–99)
Potassium: 4.1 mmol/L (ref 3.5–5.2)
SODIUM: 144 mmol/L (ref 134–144)

## 2016-02-28 LAB — VITAMIN D 25 HYDROXY (VIT D DEFICIENCY, FRACTURES): VIT D 25 HYDROXY: 32.5 ng/mL (ref 30.0–100.0)

## 2016-07-08 ENCOUNTER — Ambulatory Visit (INDEPENDENT_AMBULATORY_CARE_PROVIDER_SITE_OTHER): Payer: Medicare Other

## 2016-07-08 DIAGNOSIS — Z23 Encounter for immunization: Secondary | ICD-10-CM | POA: Diagnosis not present

## 2016-07-23 DIAGNOSIS — H2513 Age-related nuclear cataract, bilateral: Secondary | ICD-10-CM | POA: Diagnosis not present

## 2016-07-23 DIAGNOSIS — H40033 Anatomical narrow angle, bilateral: Secondary | ICD-10-CM | POA: Diagnosis not present

## 2016-09-04 ENCOUNTER — Ambulatory Visit (INDEPENDENT_AMBULATORY_CARE_PROVIDER_SITE_OTHER): Payer: Medicare Other | Admitting: Family Medicine

## 2016-09-04 ENCOUNTER — Encounter: Payer: Self-pay | Admitting: Family Medicine

## 2016-09-04 VITALS — BP 129/72 | HR 65 | Temp 97.3°F | Ht 69.0 in | Wt 217.0 lb

## 2016-09-04 DIAGNOSIS — E559 Vitamin D deficiency, unspecified: Secondary | ICD-10-CM | POA: Diagnosis not present

## 2016-09-04 DIAGNOSIS — Z800 Family history of malignant neoplasm of digestive organs: Secondary | ICD-10-CM

## 2016-09-04 DIAGNOSIS — H9190 Unspecified hearing loss, unspecified ear: Secondary | ICD-10-CM

## 2016-09-04 DIAGNOSIS — J301 Allergic rhinitis due to pollen: Secondary | ICD-10-CM

## 2016-09-04 DIAGNOSIS — H8303 Labyrinthitis, bilateral: Secondary | ICD-10-CM

## 2016-09-04 DIAGNOSIS — E7800 Pure hypercholesterolemia, unspecified: Secondary | ICD-10-CM

## 2016-09-04 DIAGNOSIS — D492 Neoplasm of unspecified behavior of bone, soft tissue, and skin: Secondary | ICD-10-CM

## 2016-09-04 DIAGNOSIS — N5201 Erectile dysfunction due to arterial insufficiency: Secondary | ICD-10-CM

## 2016-09-04 MED ORDER — MECLIZINE HCL 12.5 MG PO TABS: 12.5000 mg | ORAL_TABLET | Freq: Three times a day (TID) | ORAL | 0 refills | Status: DC | PRN

## 2016-09-04 MED ORDER — FINASTERIDE 5 MG PO TABS: 5.0000 mg | ORAL_TABLET | Freq: Every day | ORAL | 3 refills | Status: DC

## 2016-09-04 MED ORDER — FLUTICASONE PROPIONATE 50 MCG/ACT NA SUSP: 2.0000 | Freq: Every day | NASAL | 3 refills | Status: DC

## 2016-09-04 MED ORDER — NIACIN ER (ANTIHYPERLIPIDEMIC) 1000 MG PO TBCR: 1000.0000 mg | EXTENDED_RELEASE_TABLET | Freq: Every day | ORAL | 3 refills | Status: DC

## 2016-09-04 MED ORDER — DOXAZOSIN MESYLATE 2 MG PO TABS: 2.0000 mg | ORAL_TABLET | Freq: Every day | ORAL | 3 refills | Status: DC

## 2016-09-04 MED ORDER — ATORVASTATIN CALCIUM 20 MG PO TABS: 20.0000 mg | ORAL_TABLET | Freq: Every day | ORAL | 3 refills | Status: DC

## 2016-09-04 NOTE — Progress Notes (Signed)
Subjective:    Patient ID: Samuel Wheeler, male    DOB: [birth year], 70 y.o.   MRN: [id number]  HPI  Pt is here today for a follow up of chronic medical conditions which include hyperlipidemia, BPH and Vit D deficiency.The patient does complain of some dizziness today. His vital signs are stable. His weight today is 217 and previously this was 219. The patient is pleasant and alert. He does complain of some dizziness which is been going on for about a week. He says any kind of movement or motion and he feels this. He does describe some increased head congestion and drainage. He has not had a fever or anything like that. He thinks it may be getting a little bit better. He denies any chest pain pressure palpitations. He denies any shortness of breath. He denies any change in bowel habits including nausea vomiting diarrhea blood in the stool or black tarry bowel movements. He did have a colonoscopy in April 2016 and will be due another colonoscopy in 2021 because of the positive family history of colon cancer. He is passing his water without problems and may have to get up a couple of times at night time. He says this is improved from the past. The patient does complain of erectile dysfunction and that he has tried Viagra 100 mg and this does not help any.   Patient Active Problem List   Diagnosis Date Noted  . PVC's (premature ventricular contractions) 11/01/2014  . 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 HLD (hyperlipidemia) 01/27/2013  . BPH (benign prostatic hyperplasia) 01/27/2013  . Erectile dysfunction 01/27/2013  . Genital herpes simplex 01/27/2013  . Actinic keratosis 01/27/2013  . Need for zoster vaccination 01/27/2013   Outpatient Encounter Prescriptions as of 09/04/2016  Medication Sig  . albuterol (PROAIR HFA) 108 (90 Base) MCG/ACT inhaler USE 2 INHALATIONS EVERY 6 HOURS AS NEEDED FOR WHEEZING OR SHORTNESS OF BREATH  . atorvastatin (LIPITOR) 20 MG tablet Take 1 tablet (20 mg total) by  mouth daily.  . cetirizine (ZYRTEC) 10 MG tablet Take 1 tablet (10 mg total) by mouth daily.  Marland Kitchen doxazosin (CARDURA) 2 MG tablet Take 1 tablet (2 mg total) by mouth at bedtime.  . finasteride (PROSCAR) 5 MG tablet Take 1 tablet (5 mg total) by mouth daily.  . fluticasone (FLONASE) 50 MCG/ACT nasal spray Place 2 sprays into both nostrils daily.  . Naproxen Sodium 220 MG CAPS Take 220 mg by mouth as needed.  . niacin (NIASPAN) 1000 MG CR tablet Take 1 tablet (1,000 mg total) by mouth at bedtime.  . valACYclovir (VALTREX) 1000 MG tablet Take 1 tablet (1,000 mg total) by mouth daily.  . Vitamin D, Ergocalciferol, (DRISDOL) 50000 units CAPS capsule TAKE 1 CAPSULE (50,000 UNITS  TOTAL) BY MOUTH EVERY 7 (SEVEN) DAYS.  . [DISCONTINUED] Cetirizine HCl (ZYRTEC ALLERGY) 10 MG CAPS Take 1 capsule (10 mg total) by mouth daily.   No facility-administered encounter medications on file as of 09/04/2016.        Review of Systems  Constitutional: Negative.   HENT: Positive for postnasal drip (slight) and sinus pressure (slight).   Eyes: Negative.   Respiratory: Negative.   Neurological: Positive for dizziness (x 1 wk). Negative for headaches.  Psychiatric/Behavioral: Negative.    BP 129/72 (BP Location: Left Arm, Patient Position: Sitting, Cuff Size: Normal)   Pulse 65   Temp 97.3 F (36.3 C) (Oral)   Ht 5\' 9"  (1.753 m)   Wt 217  lb (98.4 kg)   SpO2 97%   BMI 32.05 kg/m      Objective:   Physical Exam  Constitutional: He is oriented to person, place, and time. He appears well-developed and well-nourished. No distress.  HENT:  Head: Normocephalic and atraumatic.  Right Ear: External ear normal.  Left Ear: External ear normal.  Nose: Nose normal.  Mouth/Throat: Oropharynx is clear and moist. No oropharyngeal exudate.  The ear canals were clear and the TMs had a normal color. There appeared to be some slight fullness in both eardrums. The throat was normal.  Eyes: Conjunctivae and EOM are  normal. Pupils are equal, round, and reactive to light. Right eye exhibits no discharge. Left eye exhibits no discharge. No scleral icterus.  Neck: Normal range of motion. Neck supple. No thyromegaly present.  No bruits thyromegaly or anterior cervical adenopathy  Cardiovascular: Normal rate, regular rhythm, normal heart sounds and intact distal pulses.   No murmur heard. The heart had a regular rate and rhythm at 64/m  Pulmonary/Chest: Effort normal and breath sounds normal. No respiratory distress. He has no wheezes. He has no rales. He exhibits no tenderness.  No chest wall masses No axillary adenopathy  Abdominal: Soft. Bowel sounds are normal. He exhibits no mass. There is no tenderness. There is no rebound and no guarding.  No liver or spleen enlargement. No bruits. No inguinal adenopathy. No masses.  Musculoskeletal: Normal range of motion. He exhibits no edema or tenderness.  Lymphadenopathy:    He has no cervical adenopathy.  Neurological: He is alert and oriented to person, place, and time. He has normal reflexes. No cranial nerve deficit.  Skin: Skin is warm and dry. No rash noted.  Psychiatric: He has a normal mood and affect. His behavior is normal. Judgment and thought content normal.  Nursing note and vitals reviewed.         Assessment & Plan:  1. Pure hypercholesterolemia -Continue with aggressive therapeutic lifestyle changes and atorvastatin and Niaspan pending results of lab work. Both his parents died of stroke. His father was a smoker.  2. Family history of colon cancer -Next colonoscopy is due and 2021. The patient is aware of this. The family history is positive in that his sister has had colon cancer.  3. Acute nonseasonal allergic rhinitis due to pollen -Continue with Flonase and use nasal saline during the day.  4. Neoplasm of skin of ear -Continue to follow-up with dermatology as needed  5. Erectile dysfunction due to arterial insufficiency -Try  Levitra 20 mg. We will check a testosterone level today with the blood work. The patient should call me back with his response to the Levitra.  6. Labyrinthitis -Meclizine 12.5   4 times daily with meals and at bedtime for 7 days then as needed  Meds ordered this encounter  Medications  . meclizine (ANTIVERT) 12.5 MG tablet    Sig: Take 1 tablet (12.5 mg total) by mouth 3 (three) times daily as needed for dizziness.    Dispense:  36 tablet    Refill:  0   Patient Instructions  Continue current medications. Continue good therapeutic lifestyle changes which include good diet and exercise. Fall precautions discussed with patient. If an FOBT was given today- please return it to our front desk. If you are over 35 years old - you may need Prevnar 67 or the adult Pneumonia vaccine.  **Flu shots are available--- please call and schedule a FLU-CLINIC appointment**  After your visit with [place]  today you will receive a survey in the mail or online from [employer] regarding your care with [place]. Please take a moment to fill this out. Your feedback is very important to [place] as you can help [place] better understand your patient needs as well as improve your experience and satisfaction. WE CARE ABOUT YOU!!!    Arrie Senate MD

## 2016-09-04 NOTE — Patient Instructions (Signed)
Continue current medications. Continue good therapeutic lifestyle changes which include good diet and exercise. Fall precautions discussed with patient. If an FOBT was given today- please return it to our front desk. If you are over 69 years old - you may need Prevnar 13 or the adult Pneumonia vaccine.  **Flu shots are available--- please call and schedule a FLU-CLINIC appointment**  After your visit with us today you will receive a survey in the mail or online from [employer] regarding your care with us. Please take a moment to fill this out. Your feedback is very important to us as you can help us better understand your patient needs as well as improve your experience and satisfaction. WE CARE ABOUT YOU!!!    

## 2016-09-05 LAB — BMP8+EGFR
BUN / CREAT RATIO: 21 (ref 10–24)
BUN: 19 mg/dL (ref 8–27)
CO2: 24 mmol/L (ref 18–29)
CREATININE: 0.89 mg/dL (ref 0.76–1.27)
Calcium: 8.9 mg/dL (ref 8.6–10.2)
Chloride: 102 mmol/L (ref 96–106)
GFR calc non Af Amer: 87 mL/min/{1.73_m2} (ref >59)
GFR, EST AFRICAN AMERICAN: 101 mL/min/{1.73_m2} (ref >59)
Glucose: 95 mg/dL (ref 65–99)
Potassium: 4.1 mmol/L (ref 3.5–5.2)
Sodium: 141 mmol/L (ref 134–144)

## 2016-09-05 LAB — CBC WITH DIFFERENTIAL/PLATELET
BASOS: 0 %
Basophils Absolute: 0 10*3/uL (ref 0.0–0.2)
EOS (ABSOLUTE): 0.3 10*3/uL (ref 0.0–0.4)
EOS: 4 %
HEMATOCRIT: 43 % (ref 37.5–51.0)
HEMOGLOBIN: 14.6 g/dL (ref 12.6–17.7)
IMMATURE GRANS (ABS): 0 10*3/uL (ref 0.0–0.1)
Immature Granulocytes: 0 %
LYMPHS: 42 %
Lymphocytes Absolute: 3.1 10*3/uL (ref 0.7–3.1)
MCH: 30.2 pg (ref 26.6–33.0)
MCHC: 34 g/dL (ref 31.5–35.7)
MCV: 89 fL (ref 79–97)
MONOCYTES: 6 %
Monocytes Absolute: 0.5 10*3/uL (ref 0.1–0.9)
NEUTROS ABS: 3.6 10*3/uL (ref 1.4–7.0)
Neutrophils: 48 %
Platelets: 214 10*3/uL (ref 150–379)
RBC: 4.83 x10E6/uL (ref 4.14–5.80)
RDW: 13 % (ref 12.3–15.4)
WBC: 7.5 10*3/uL (ref 3.4–10.8)

## 2016-09-05 LAB — HEPATIC FUNCTION PANEL
ALK PHOS: 73 IU/L (ref 39–117)
ALT: 24 IU/L (ref 0–44)
AST: 18 IU/L (ref 0–40)
Albumin: 4.3 g/dL (ref 3.6–4.8)
BILIRUBIN TOTAL: 0.8 mg/dL (ref 0.0–1.2)
BILIRUBIN, DIRECT: 0.22 mg/dL (ref 0.00–0.40)
Total Protein: 6.9 g/dL (ref 6.0–8.5)

## 2016-09-05 LAB — TESTOSTERONE,FREE AND TOTAL
TESTOSTERONE: 418 ng/dL (ref 264–916)
Testosterone, Free: 6.7 pg/mL (ref 6.6–18.1)

## 2016-09-05 LAB — VITAMIN D 25 HYDROXY (VIT D DEFICIENCY, FRACTURES): Vit D, 25-Hydroxy: 38.7 ng/mL (ref 30.0–100.0)

## 2016-09-06 LAB — NMR, LIPOPROFILE
Cholesterol: 153 mg/dL (ref 100–199)
HDL Cholesterol by NMR: 45 mg/dL (ref >39)
HDL PARTICLE NUMBER: 28.8 umol/L — AB (ref >=30.5)
LDL Particle Number: 1094 nmol/L — ABNORMAL HIGH (ref <1000)
LDL SIZE: 20.2 nm (ref >20.5)
LDL-C: 82 mg/dL (ref 0–99)
LP-IR SCORE: 63 — AB (ref <=45)
SMALL LDL PARTICLE NUMBER: 826 nmol/L — AB (ref <=527)
Triglycerides by NMR: 131 mg/dL (ref 0–149)

## 2016-09-26 ENCOUNTER — Other Ambulatory Visit: Payer: Self-pay | Admitting: Family Medicine

## 2016-10-08 DIAGNOSIS — L570 Actinic keratosis: Secondary | ICD-10-CM | POA: Diagnosis not present

## 2016-10-08 DIAGNOSIS — D485 Neoplasm of uncertain behavior of skin: Secondary | ICD-10-CM | POA: Diagnosis not present

## 2016-10-08 DIAGNOSIS — D0421 Carcinoma in situ of skin of right ear and external auricular canal: Secondary | ICD-10-CM | POA: Diagnosis not present

## 2016-10-08 DIAGNOSIS — Z85828 Personal history of other malignant neoplasm of skin: Secondary | ICD-10-CM | POA: Diagnosis not present

## 2016-10-16 DIAGNOSIS — C44222 Squamous cell carcinoma of skin of right ear and external auricular canal: Secondary | ICD-10-CM | POA: Diagnosis not present

## 2016-10-16 DIAGNOSIS — L570 Actinic keratosis: Secondary | ICD-10-CM | POA: Diagnosis not present

## 2016-10-16 DIAGNOSIS — D0421 Carcinoma in situ of skin of right ear and external auricular canal: Secondary | ICD-10-CM | POA: Diagnosis not present

## 2016-10-30 DIAGNOSIS — Z4802 Encounter for removal of sutures: Secondary | ICD-10-CM | POA: Diagnosis not present

## 2016-12-16 ENCOUNTER — Ambulatory Visit (INDEPENDENT_AMBULATORY_CARE_PROVIDER_SITE_OTHER): Payer: Medicare Other | Admitting: Family Medicine

## 2016-12-16 ENCOUNTER — Emergency Department (HOSPITAL_COMMUNITY): Payer: Medicare Other

## 2016-12-16 ENCOUNTER — Encounter (HOSPITAL_COMMUNITY): Payer: Self-pay | Admitting: Emergency Medicine

## 2016-12-16 ENCOUNTER — Emergency Department (HOSPITAL_COMMUNITY)
Admission: EM | Admit: 2016-12-16 | Discharge: 2016-12-16 | Disposition: A | Discharge status: Home / Self Care | Payer: Medicare Other | Attending: Emergency Medicine | Admitting: Emergency Medicine

## 2016-12-16 VITALS — BP 164/92 | Temp 97.5°F

## 2016-12-16 DIAGNOSIS — S0191XA Laceration without foreign body of unspecified part of head, initial encounter: Secondary | ICD-10-CM | POA: Diagnosis not present

## 2016-12-16 DIAGNOSIS — W1800XA Striking against unspecified object with subsequent fall, initial encounter: Secondary | ICD-10-CM | POA: Insufficient documentation

## 2016-12-16 DIAGNOSIS — I10 Essential (primary) hypertension: Secondary | ICD-10-CM | POA: Diagnosis not present

## 2016-12-16 DIAGNOSIS — Z0389 Encounter for observation for other suspected diseases and conditions ruled out: Secondary | ICD-10-CM | POA: Diagnosis not present

## 2016-12-16 DIAGNOSIS — S0990XA Unspecified injury of head, initial encounter: Secondary | ICD-10-CM | POA: Diagnosis not present

## 2016-12-16 DIAGNOSIS — W19XXXA Unspecified fall, initial encounter: Secondary | ICD-10-CM

## 2016-12-16 DIAGNOSIS — S0232XA Fracture of orbital floor, left side, initial encounter for closed fracture: Secondary | ICD-10-CM | POA: Insufficient documentation

## 2016-12-16 DIAGNOSIS — Y939 Activity, unspecified: Secondary | ICD-10-CM | POA: Insufficient documentation

## 2016-12-16 DIAGNOSIS — Y9289 Other specified places as the place of occurrence of the external cause: Secondary | ICD-10-CM | POA: Insufficient documentation

## 2016-12-16 DIAGNOSIS — Y999 Unspecified external cause status: Secondary | ICD-10-CM | POA: Diagnosis not present

## 2016-12-16 DIAGNOSIS — Z85828 Personal history of other malignant neoplasm of skin: Secondary | ICD-10-CM | POA: Insufficient documentation

## 2016-12-16 DIAGNOSIS — M542 Cervicalgia: Secondary | ICD-10-CM | POA: Diagnosis not present

## 2016-12-16 DIAGNOSIS — S199XXA Unspecified injury of neck, initial encounter: Secondary | ICD-10-CM | POA: Diagnosis not present

## 2016-12-16 DIAGNOSIS — S0181XA Laceration without foreign body of other part of head, initial encounter: Secondary | ICD-10-CM

## 2016-12-16 DIAGNOSIS — S41012A Laceration without foreign body of left shoulder, initial encounter: Secondary | ICD-10-CM | POA: Diagnosis not present

## 2016-12-16 DIAGNOSIS — S01112A Laceration without foreign body of left eyelid and periocular area, initial encounter: Secondary | ICD-10-CM | POA: Diagnosis not present

## 2016-12-16 DIAGNOSIS — Z79899 Other long term (current) drug therapy: Secondary | ICD-10-CM | POA: Insufficient documentation

## 2016-12-16 DIAGNOSIS — S0912XA Laceration of muscle and tendon of head, initial encounter: Secondary | ICD-10-CM

## 2016-12-16 MED ORDER — TETRACAINE HCL 0.5 % OP SOLN
1.0000 [drp] | Freq: Once | OPHTHALMIC | Status: AC
  Administered 2016-12-16: 1 [drp] via OPHTHALMIC
  Filled 2016-12-16: qty 4

## 2016-12-16 MED ORDER — CEFTRIAXONE SODIUM 1 G IJ SOLR
1.0000 g | Freq: Once | INTRAMUSCULAR | Status: AC
  Administered 2016-12-16: 1 g via INTRAMUSCULAR

## 2016-12-16 MED ORDER — FLUORESCEIN SODIUM 0.6 MG OP STRP
1.0000 | ORAL_STRIP | Freq: Once | OPHTHALMIC | Status: AC
  Administered 2016-12-16: 1 via OPHTHALMIC
  Filled 2016-12-16: qty 1

## 2016-12-16 MED ORDER — BACITRACIN ZINC 500 UNIT/GM EX OINT
TOPICAL_OINTMENT | CUTANEOUS | Status: AC
  Filled 2016-12-16: qty 0.9

## 2016-12-16 MED ORDER — LIDOCAINE-EPINEPHRINE 2 %-1:200000 IJ SOLN
10.0000 mL | Freq: Once | INTRAMUSCULAR | Status: AC
Start: 2016-12-16 — End: 2016-12-16
  Administered 2016-12-16: 10 mL via INTRADERMAL
  Filled 2016-12-16: qty 20

## 2016-12-16 NOTE — ED Notes (Signed)
ED Provider at bedside for suturing.  

## 2016-12-16 NOTE — ED Provider Notes (Signed)
[facility] DEPT Provider Note   CSN: [id number] Arrival date & time: 12/16/16  1901     History   Chief Complaint Chief Complaint  Patient presents with  . Fall    HPI Samuel Wheeler is a 71 y.o. male.  HPI 71 year old male who presents after fall. History of HTN, HLD, BPH and vertigo. States he was in basement today, had sudden onset of dizziness and fell to ground onto left shoulder and left head. No syncope, near syncope, or LOC after head strike. States he was able to get up and ambulate. Happened at 8 AM but had to take his wife to cancer appointment. Thus, with delayed presentation. With left eye swelling and bruising but no eye pain or vision loss. With laceration to left eye brow. Tetanus up to date per patient. Otherwise been in usual state of health. No recent infection. No nausea, vomiting, diarrhea, fever, chest pain, difficulty breathing, bloody stools, back pain or abdominal pain. Take baby aspirin, no other blood thinners.  Past Medical History:  Diagnosis Date  . Actinic keratosis   . Allergy    Year Round   . Arthritis   . BPH (benign prostatic hyperplasia)   . Cataract 2015   Right Eye   . ED (erectile dysfunction)   . Hyperlipidemia   . Hypertension    pt denies    Patient Active Problem List   Diagnosis Date Noted  . PVC's (premature ventricular contractions) 11/01/2014  . 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 HLD (hyperlipidemia) 01/27/2013  . BPH (benign prostatic hyperplasia) 01/27/2013  . Erectile dysfunction 01/27/2013  . Genital herpes simplex 01/27/2013  . Actinic keratosis 01/27/2013  . Need for zoster vaccination 01/27/2013    Past Surgical History:  Procedure Laterality Date  . CERVICAL SPINE SURGERY    . COLONOSCOPY  2009   tics only  . right shoulder         Home Medications    Prior to Admission medications   Medication Sig Start Date End Date Taking? Authorizing Provider  albuterol (PROAIR HFA) 108 (90 Base) MCG/ACT  inhaler USE 2 INHALATIONS EVERY 6 HOURS AS NEEDED FOR WHEEZING OR SHORTNESS OF BREATH 02/27/16   Chipper Herb, MD  atorvastatin (LIPITOR) 20 MG tablet Take 1 tablet (20 mg total) by mouth daily. 09/04/16   Chipper Herb, MD  cetirizine (ZYRTEC) 10 MG tablet Take 1 tablet (10 mg total) by mouth daily. 02/27/16   Chipper Herb, MD  doxazosin (CARDURA) 2 MG tablet Take 1 tablet (2 mg total) by mouth at bedtime. 09/04/16   Chipper Herb, MD  finasteride (PROSCAR) 5 MG tablet Take 1 tablet (5 mg total) by mouth daily. 09/04/16   Chipper Herb, MD  fluticasone (FLONASE) 50 MCG/ACT nasal spray Place 2 sprays into both nostrils daily. 09/04/16   Chipper Herb, MD  meclizine (ANTIVERT) 12.5 MG tablet TAKE ONE TABLET BY MOUTH THREE TIMES DAILY AS NEEDED FOR DIZZINESS 09/26/16   Chipper Herb, MD  Naproxen Sodium 220 MG CAPS Take 220 mg by mouth as needed.    Historical Provider, MD  niacin (NIASPAN) 1000 MG CR tablet Take 1 tablet (1,000 mg total) by mouth at bedtime. 09/04/16   Chipper Herb, MD  valACYclovir (VALTREX) 1000 MG tablet Take 1 tablet (1,000 mg total) by mouth daily. 02/27/16   Chipper Herb, MD  Vitamin D, Ergocalciferol, (DRISDOL) 50000 units CAPS capsule TAKE 1 CAPSULE (50,000 UNITS  TOTAL) BY MOUTH EVERY 7 (SEVEN) DAYS. 02/27/16   Chipper Herb, MD    Family History Family History  Problem Relation Age of Onset  . Stroke Mother 7  . Stroke Father 20  . Diabetes Father   . COPD Father   . Heart disease Sister     "Mild heart attack", pacemaker  . Stroke Brother 24  . Syncope episode Brother   . Cancer Sister 53    Colon Cancer  . Cancer Sister     Skin Cancers  . Cancer Sister     Melanoma  . Cancer Sister     Skin Cancer    Social History Social History  Substance Use Topics  . Smoking status: Never Smoker  . Smokeless tobacco: Never Used  . Alcohol use No     Allergies   Patient has no known allergies.   Review of Systems Review of Systems    Constitutional: Negative for fever.  HENT: Negative for congestion.   Eyes: Negative for pain and visual disturbance.  Respiratory: Negative for cough and shortness of breath.   Cardiovascular: Negative for chest pain.  Gastrointestinal: Negative for abdominal pain.  Genitourinary: Negative for difficulty urinating and flank pain.  Musculoskeletal: Negative for back pain.  Skin: Positive for wound.  Allergic/Immunologic: Negative for immunocompromised state.  Neurological: Negative for weakness and numbness.  Hematological: Does not bruise/bleed easily.  Psychiatric/Behavioral: Negative for confusion.     Physical Exam Updated Vital Signs BP 169/76   Pulse 88   Temp 98.8 F (37.1 C)   Resp 18   Ht 5\' 10"  (1.778 m)   Wt 215 lb (97.5 kg)   SpO2 97%   BMI 30.85 kg/m   Physical Exam Physical Exam  Nursing note and vitals reviewed. Constitutional: Well developed, well nourished, non-toxic, and in no acute distress Head: Normocephalic. 6 cm laceration above the left eyebrow. Bleeding controlled. Left periorbital ecchymosis and swelling  Eye: PERRL. EOMI in tact bilaterally. Vision grossly normal in bilateral eyes. There is some chemosis of the left eye. No hyphema noted.   Mouth/Throat: Oropharynx is clear and moist.  Neck: Normal range of motion. Neck supple. mild bilateral paraspinal neck tenderness Cardiovascular: Normal rate and regular rhythm.   Pulmonary/Chest: Effort normal and breath sounds normal.  Abdominal: Soft. There is no tenderness. There is no rebound and no guarding.  Musculoskeletal: Normal range of motion.  Neurological: Alert, no facial droop, fluent speech, moves all extremities symmetrically Skin: Skin is warm and dry.  Psychiatric: Cooperative   ED Treatments / Results  Labs (all labs ordered are listed, but only abnormal results are displayed) Labs Reviewed - No data to display  EKG  EKG Interpretation  Date/Time:  Tuesday December 16 2016  20:00:55 EDT Ventricular Rate:  70 PR Interval:    QRS Duration: 107 QT Interval:  390 QTC Calculation: 421 R Axis:   -33 Text Interpretation:  Sinus rhythm Left axis deviation no acute changes  Confirmed by Hamid Brookens MD, Chancie Lampert [phone]) on 12/16/2016 8:19:17 PM       Radiology Ct Head Wo Contrast  Result Date: 12/16/2016 CLINICAL DATA:  71 y/o  M; fall with laceration above the left eye. EXAM: CT HEAD WITHOUT CONTRAST TECHNIQUE: Contiguous axial images were obtained from the base of the skull through the vertex without intravenous contrast. COMPARISON:  None. FINDINGS: Brain: No evidence of acute infarction, hemorrhage, hydrocephalus, extra-axial collection or mass lesion/mass effect. Dystrophic calcifications of the basal ganglia. Vascular: Mild calcific  atherosclerosis of cavernous internal carotid arteries. Skull: Swelling of the scalp and skin laceration overlying the left frontal bone and periorbital soft tissues. No displaced calvarial fracture. Floor of orbit fracture measuring 15 x 15 mm (AP by ML series 5, image 41 and series 4, image 16) with 5 mm of inferior displacement and herniation of extraconal fat. No extraocular muscle herniation. Sinuses/Orbits: Right frontal, right greater than left ethmoid sinus mucosal thickening. Left maxillary sinus fluid levels probably hemorrhage. Other: None. IMPRESSION: 1. No acute intracranial abnormality. 2. Left floor of orbit fracture with 5 mm inferior herniation of extraconal fat. No appreciable herniation of extraocular muscles. 3. Left frontal scalp and periorbital soft tissue contusion. These results will be called to the ordering clinician or representative by the Radiologist Assistant, and communication documented in the PACS or zVision Dashboard. Electronically Signed   By: Kristine Garbe M.D.   On: 12/16/2016 19:33   Ct Cervical Spine Wo Contrast  Result Date: 12/16/2016 CLINICAL DATA:  Dizziness and fall.  Right-sided neck pain. EXAM: CT  CERVICAL SPINE WITHOUT CONTRAST TECHNIQUE: Multidetector CT imaging of the cervical spine was performed without intravenous contrast. Multiplanar CT image reconstructions were also generated. COMPARISON:  None. FINDINGS: Alignment: Grade 1 C4-C5 anterolisthesis, likely due to facet hypertrophy. No other static subluxation. Facets are aligned. Occipital condyles and the lateral masses of C1 and C2 are normally approximated. Skull base and vertebrae: No acute fracture. There is ACDF hardware at C3-C4 and C5-C6 without focal hardware abnormality. Soft tissues and spinal canal: No prevertebral fluid or swelling. No visible canal hematoma. Disc levels: No bony spinal canal stenosis. Mild-to-moderate right C4-C5 and bilateral C6-C7 foraminal stenosis. Upper chest: No pneumothorax, pulmonary nodule or pleural effusion. Other: Normal visualized paraspinal cervical soft tissues. IMPRESSION: 1. No acute fracture of the cervical spine. 2. Status post C3-C4 and C5-C6 ACDF with grade 1 C4-C5 anterolisthesis is likely secondary to facet arthrosis. 3. Mild to moderate right C4-5 and bilateral C6-7 foraminal stenosis. Electronically Signed   By: Ulyses Jarred M.D.   On: 12/16/2016 20:44   Dg Shoulder Left  Result Date: 12/16/2016 CLINICAL DATA:  71 y/o M; fall with left anterior shoulder laceration. EXAM: LEFT SHOULDER - 2+ VIEW COMPARISON:  None. FINDINGS: There is no evidence of fracture or dislocation. There is no evidence of arthropathy or other focal bone abnormality. Soft tissues are unremarkable. Partially visualized anterior cervical fusion hardware. IMPRESSION: No acute fracture or dislocation identified. Electronically Signed   By: Kristine Garbe M.D.   On: 12/16/2016 19:25    Procedures .Marland KitchenLaceration Repair Date/Time: 12/16/2016 9:48 PM Performed by: Brantley Stage DUO Authorized by: Brantley Stage DUO   Consent:    Consent obtained:  Verbal   Consent given by:  Patient   Risks discussed:  Infection, need  for additional repair, pain and poor cosmetic result   Alternatives discussed:  No treatment and delayed treatment Anesthesia (see MAR for exact dosages):    Anesthesia method:  None Laceration details:    Location:  Face   Face location:  L eyebrow   Length (cm):  6 Repair type:    Repair type:  Simple Pre-procedure details:    Preparation:  Patient was prepped and draped in usual sterile fashion Exploration:    Hemostasis achieved with:  Direct pressure   Contaminated: no   Treatment:    Area cleansed with:  Betadine   Amount of cleaning:  Standard   Irrigation solution:  Sterile saline   Irrigation volume:  1Liter   Irrigation method:  Syringe   Visualized foreign bodies/material removed: yes   Skin repair:    Repair method:  Sutures   Suture size:  6-0   Suture material:  Nylon   Suture technique:  Simple interrupted   Number of sutures:  9 Approximation:    Approximation:  Close Post-procedure details:    Dressing:  Antibiotic ointment and non-adherent dressing   Patient tolerance of procedure:  Tolerated well, no immediate complications Comments:     Lidocaine w/ epinephrine 2% used for local anesthesia.    (including critical care time)  Medications Ordered in ED Medications  bacitracin 500 UNIT/GM ointment (not administered)  tetracaine (PONTOCAINE) 0.5 % ophthalmic solution 1 drop (1 drop Left Eye Given by Other 12/16/16 2032)  fluorescein ophthalmic strip 1 strip (1 strip Left Eye Given by Other 12/16/16 2032)  lidocaine-EPINEPHrine (XYLOCAINE W/EPI) 2 %-1:200000 (PF) injection 10 mL (10 mLs Intradermal Given by Other 12/16/16 2032)     Initial Impression / Assessment and Plan / ED Course  I have reviewed the triage vital signs and the nursing notes.  Pertinent labs & imaging results that were available during my care of the patient were reviewed by me and considered in my medical decision making (see chart for details).     Fall today after dizziness  episode, now resolved. Neuro in tact. mentating normally. No concerns for central vertigo. No syncope or near syncope. EKG nonischemic and w/o stigmata of arrhythmia.  CT head and c-spine visualilzed and w/o traumatic injury. With large laceration above eye brow which is repaired. There is left orbital floor fracture on CT, without entrapment of muscles. EOMI on exam.   No hyphemia. No laceration or seidel's sign on fluorescein exam. Some chemosis noted. No eye pain or vision changes. Low supsion for orbital fracture.   Discussed follow up with ENT for orbital floor fracture. Repeat eye exam with his ophthalmologist. PCP follow-up for wound check suture removal. Wound care discussed. Strict return and follow-up instructions reviewed. He expressed understanding of all discharge instructions and felt comfortable with the plan of care.   Final Clinical Impressions(s) / ED Diagnoses   Final diagnoses:  Fall, initial encounter  Facial laceration, initial encounter  Closed fracture of left orbital floor, initial encounter [facility])    New Prescriptions Discharge Medication List as of 12/16/2016  9:38 PM       Forde Dandy, MD 12/16/16 2153

## 2016-12-16 NOTE — ED Notes (Signed)
ED Provider at bedside. 

## 2016-12-16 NOTE — ED Notes (Signed)
Patient transported to CT 

## 2016-12-16 NOTE — ED Triage Notes (Signed)
Pt states he had a spell of dizziness this am around 0800 and fell on his left shoulder and left side of head. Pt has deep laceration above the left eye.

## 2016-12-16 NOTE — Discharge Instructions (Signed)
Have sutures removed in 5 days. Watch for signs of infection. Return for fever, pus drainage, increased swelling/redness over face.  Please also call your eye doctor for repeat exam this week.  Your orbit is fractured below. Please call ENT, Dr. Royce Macadamia listed above for follow-up.  Return for worsening symptoms, including severe eye pain, vision loss, severe headches, confusion, intractable vomiting, difficulty walking or any other symptoms concerning to you.

## 2016-12-21 NOTE — Progress Notes (Signed)
Subjective:  Patient ID: Samuel Wheeler, male    DOB: 11-Sep-1946  Age: 71 y.o. MRN: [id number]  CC: No chief complaint on file.   HPI Samuel Wheeler presents for Pt. Fell through a door just before presentation. He fell hitting the sharp edge of a chair with the left side of face. Denies LOC. Tetanus up to date.N Now has large laceration at left forehead Extending into the brow. Also painful hematoma with  Swelling of the left cheek.. Pt. Insists that he [facility] [place] [place] 1-2 hours.   History Samuel Wheeler has a past medical history of Actinic keratosis; Allergy; Arthritis; BPH (benign prostatic hyperplasia); Cataract (2015); ED (erectile dysfunction); Hyperlipidemia; and Hypertension.   He has a past surgical history that includes Cervical spine surgery; right shoulder; and Colonoscopy (2009).   His family history includes COPD in his father; Cancer in his sister, sister, and sister; Cancer (age of onset: 34) in his sister; Diabetes in his father; Heart disease in his sister; Stroke (age of onset: 71) in his brother; Stroke (age of onset: 57) in his father; Stroke (age of onset: 27) in his mother; Syncope episode in his brother.He reports that he has never smoked. He has never used smokeless tobacco. He reports that he does not drink alcohol or use drugs.  Current Outpatient Prescriptions on File Prior to Visit  Medication Sig Dispense Refill  . albuterol (PROAIR HFA) 108 (90 Base) MCG/ACT inhaler USE 2 INHALATIONS EVERY 6 HOURS AS NEEDED FOR WHEEZING OR SHORTNESS OF BREATH 54 g 3  . atorvastatin (LIPITOR) 20 MG tablet Take 1 tablet (20 mg total) by mouth daily. 90 tablet 3  . cetirizine (ZYRTEC) 10 MG tablet Take 1 tablet (10 mg total) by mouth daily. 90 tablet 3  . doxazosin (CARDURA) 2 MG tablet Take 1 tablet (2 mg total) by mouth at bedtime. 90 tablet 3  . finasteride (PROSCAR) 5 MG tablet Take 1 tablet (5 mg total) by mouth daily. 90  tablet 3  . fluticasone (FLONASE) 50 MCG/ACT nasal spray Place 2 sprays into both nostrils daily. 48 g 3  . meclizine (ANTIVERT) 12.5 MG tablet TAKE ONE TABLET BY MOUTH THREE TIMES DAILY AS NEEDED FOR DIZZINESS 36 tablet 1  . Naproxen Sodium 220 MG CAPS Take 220 mg by mouth as needed.    . niacin (NIASPAN) 1000 MG CR tablet Take 1 tablet (1,000 mg total) by mouth at bedtime. 90 tablet 3  . valACYclovir (VALTREX) 1000 MG tablet Take 1 tablet (1,000 mg total) by mouth daily. 90 tablet 3  . Vitamin D, Ergocalciferol, (DRISDOL) 50000 units CAPS capsule TAKE 1 CAPSULE (50,000 UNITS  TOTAL) BY MOUTH EVERY 7 (SEVEN) DAYS. 12 capsule 3   No current facility-administered medications on file prior to visit.     ROS Review of Systems  Constitutional: Negative.   Respiratory: Negative.   Cardiovascular: Negative.   Neurological: Negative.   Psychiatric/Behavioral: Negative.     Objective:  BP (!) 164/92 (BP Location: Right Arm)   Temp 97.5 F (36.4 C)   Physical Exam  Constitutional: He appears well-developed. He appears distressed.  HENT:  There is a 6 cm laceration of the left frontal scalp extending from the brow to the hairline 8 cm. In length. Gaping open to the close tissue of the scalp.   The left cheek is massively swollen with a hematoma covering the entire cheek. Also there is a mass  effect against the left lid pushing the lower lid over the eye. However there is [place]. Marked tenderness to palpation. Bleeding is controlled.   Eyes: EOM are normal. Pupils are equal, round, and reactive to light.  Cardiovascular: Normal rate and regular rhythm.   Pulmonary/Chest: Breath sounds normal.    Assessment & Plan:   Diagnoses and all orders for this visit:  Laceration of fascia of head -     cefTRIAXone (ROCEPHIN) injection 1 g; Inject 1 g into the muscle once.   I am having Samuel Wheeler maintain his Naproxen Sodium, cetirizine, albuterol, Vitamin D (Ergocalciferol),  valACYclovir, atorvastatin, doxazosin, finasteride, fluticasone, niacin, and meclizine. We administered cefTRIAXone.  Meds ordered this encounter  Medications  . cefTRIAXone (ROCEPHIN) injection 1 g   Due to the depth and extent of tissue damage the patient was transferred to the E.D. For definitive repair in layers, possible evacuation of the hematoma and CT of the facial bones. Pt. Refused due to urgency and critical nature of his wife's evaluation. Therefore antibiotic administered, dressing applied and pt. Strongly advised to seek emergent care ASAP after his wife's appt.  Follow-up: Return if symptoms worsen or fail to improve.  Claretta Fraise, M.D.

## 2016-12-23 ENCOUNTER — Ambulatory Visit (INDEPENDENT_AMBULATORY_CARE_PROVIDER_SITE_OTHER): Payer: Medicare Other | Admitting: Family Medicine

## 2016-12-23 ENCOUNTER — Encounter: Payer: Self-pay | Admitting: Family Medicine

## 2016-12-23 VITALS — BP 146/78 | HR 79 | Temp 97.4°F | Ht 70.0 in | Wt 218.4 lb

## 2016-12-23 DIAGNOSIS — S0181XD Laceration without foreign body of other part of head, subsequent encounter: Secondary | ICD-10-CM

## 2016-12-23 NOTE — Progress Notes (Signed)
   Subjective:    Patient ID: Samuel Wheeler, male    DOB: March 14, 1946, 71 y.o.   MRN: [id number]  HPI patient has a history of vertigo and suffered a an episode of vertigo 8 days ago fell hands with laceration and bruising on the left side of his face and for head. There was some 12 hours between the time of the injury and repair as he had to take his wife to the doctor in [place]. He presents today for suture removal 8 days after repair.  Patient Active Problem List   Diagnosis Date Noted  . PVC's (premature ventricular contractions) 11/01/2014  . 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 HLD (hyperlipidemia) 01/27/2013  . BPH (benign prostatic hyperplasia) 01/27/2013  . Erectile dysfunction 01/27/2013  . Genital herpes simplex 01/27/2013  . Actinic keratosis 01/27/2013  . Need for zoster vaccination 01/27/2013   Outpatient Encounter Prescriptions as of 12/23/2016  Medication Sig  . albuterol (PROAIR HFA) 108 (90 Base) MCG/ACT inhaler USE 2 INHALATIONS EVERY 6 HOURS AS NEEDED FOR WHEEZING OR SHORTNESS OF BREATH  . aspirin 81 MG chewable tablet Chew 81 mg by mouth daily.  Marland Kitchen atorvastatin (LIPITOR) 20 MG tablet Take 1 tablet (20 mg total) by mouth daily.  . cetirizine (ZYRTEC) 10 MG tablet Take 1 tablet (10 mg total) by mouth daily.  Marland Kitchen doxazosin (CARDURA) 2 MG tablet Take 1 tablet (2 mg total) by mouth at bedtime.  . finasteride (PROSCAR) 5 MG tablet Take 1 tablet (5 mg total) by mouth daily.  . fluticasone (FLONASE) 50 MCG/ACT nasal spray Place 2 sprays into both nostrils daily.  . Naproxen Sodium 220 MG CAPS Take 220 mg by mouth as needed.  . niacin (NIASPAN) 1000 MG CR tablet Take 1 tablet (1,000 mg total) by mouth at bedtime.  . valACYclovir (VALTREX) 1000 MG tablet Take 1 tablet (1,000 mg total) by mouth daily.  . Vitamin D, Ergocalciferol, (DRISDOL) 50000 units CAPS capsule TAKE 1 CAPSULE (50,000 UNITS  TOTAL) BY MOUTH EVERY 7 (SEVEN) DAYS.  Marland Kitchen meclizine (ANTIVERT) 12.5 MG tablet TAKE  ONE TABLET BY MOUTH THREE TIMES DAILY AS NEEDED FOR DIZZINESS (Patient not taking: Reported on 12/23/2016)   No facility-administered encounter medications on file as of 12/23/2016.       Review of Systems  Neurological: Positive for dizziness and numbness.       Objective:   Physical Exam  Skin:  Sutures removed. There were nondisplaced and 9 removed. Wound looks okay no sign of infection or drainage.   BP (!) 141/79   Pulse 76   Temp 97.4 F (36.3 C) (Oral)   Ht 5\' 10"  (1.778 m)   Wt 218 lb 6.4 oz (99.1 kg)   BMI 31.34 kg/m        Assessment & Plan:  1. Facial laceration, subsequent encounter Sutures removed. Spent some time talking about dizziness. He was advised to see ENT for a fracture of the facial bone. Still having some headaches and dizziness but he had that prior to the fall as well  Wardell Honour MD

## 2016-12-24 ENCOUNTER — Telehealth: Payer: Self-pay | Admitting: Family Medicine

## 2016-12-24 DIAGNOSIS — R42 Dizziness and giddiness: Secondary | ICD-10-CM

## 2016-12-24 NOTE — Telephone Encounter (Signed)
Schedule referral to ENT for further evaluation dizziness

## 2017-01-12 ENCOUNTER — Other Ambulatory Visit: Payer: Self-pay | Admitting: Family Medicine

## 2017-01-19 DIAGNOSIS — R42 Dizziness and giddiness: Secondary | ICD-10-CM | POA: Diagnosis not present

## 2017-01-19 DIAGNOSIS — H8113 Benign paroxysmal vertigo, bilateral: Secondary | ICD-10-CM | POA: Diagnosis not present

## 2017-01-28 ENCOUNTER — Ambulatory Visit (INDEPENDENT_AMBULATORY_CARE_PROVIDER_SITE_OTHER): Payer: Medicare Other | Admitting: *Deleted

## 2017-01-28 VITALS — BP 134/80 | HR 64 | Temp 97.2°F | Ht 69.0 in | Wt 217.0 lb

## 2017-01-28 DIAGNOSIS — Z0000 Encounter for general adult medical examination without abnormal findings: Secondary | ICD-10-CM

## 2017-01-28 NOTE — Progress Notes (Signed)
Subjective:   Samuel Wheeler is a 71 y.o. male who presents for Medicare Annual/Subsequent preventive examination.  Patient here today for medicare annual wellness exam. He is a 71 year old who lives in his home with his wife. She is a total care patient with lung cancer and they are very busy with appointments. He is is retired from [employer]. For enjoyment and exercise he does yard work and gardening. He does all of the cleaning and cooking, which he states is healthy. He never had children of his own, but does have 6 stepchildren. He and his wife have 11 cats indoors and 7 cats outdoors. He is aware of fall hazards and states that he is always careful when using the many stairs in his home. He has recently had several bouts of dizziness and for this reason, he states that his health is a little worse than it was a year ago.       Objective:    Vitals: BP 134/80   Pulse 64   Temp 97.2 F (36.2 C) (Oral)   Ht 5\' 9"  (1.753 m)   Wt 217 lb (98.4 kg)   BMI 32.05 kg/m   Body mass index is 32.05 kg/m.  Tobacco History  Smoking Status  . Never Smoker  Smokeless Tobacco  . Never Used     Counseling given: Not Answered never used any tobacco products.   Past Medical History:  Diagnosis Date  . Actinic keratosis   . Allergy    Year Round   . Arthritis   . BPH (benign prostatic hyperplasia)   . Cancer ([place])    skin cancer - removed several times  . Cataract 2015   Right Eye   . ED (erectile dysfunction)   . Hyperlipidemia   . Hypertension    pt denies   Past Surgical History:  Procedure Laterality Date  . CERVICAL SPINE SURGERY    . COLONOSCOPY  2009   tics only  . right shoulder    . skin cancer removal     right ear x 2   Family History  Problem Relation Age of Onset  . Stroke Mother 32  . Stroke Father 68  . Diabetes Father   . COPD Father   . Heart disease Sister     "Mild heart attack", pacemaker  . Stroke Brother 11  . Syncope episode Brother   .  Parkinson's disease Sister   . Cancer Sister 27    Colon Cancer  . Cancer Sister     Skin Cancers  . Cancer Sister     Melanoma  . Cancer Sister     Skin Cancer  . Cancer Maternal Aunt     bone   History  Sexual Activity  . Sexual activity: Yes  . Birth control/ protection: None    Outpatient Encounter Prescriptions as of 01/28/2017  Medication Sig  . albuterol (PROAIR HFA) 108 (90 Base) MCG/ACT inhaler USE 2 INHALATIONS EVERY 6 HOURS AS NEEDED FOR WHEEZING OR SHORTNESS OF BREATH  . aspirin 81 MG chewable tablet Chew 81 mg by mouth daily.  Marland Kitchen atorvastatin (LIPITOR) 20 MG tablet Take 1 tablet (20 mg total) by mouth daily.  . cetirizine (ZYRTEC) 10 MG tablet Take 1 tablet (10 mg total) by mouth daily.  Marland Kitchen doxazosin (CARDURA) 2 MG tablet Take 1 tablet (2 mg total) by mouth at bedtime.  . finasteride (PROSCAR) 5 MG tablet Take 1 tablet (5 mg total) by mouth  daily.  . fluticasone (FLONASE) 50 MCG/ACT nasal spray Place 2 sprays into both nostrils daily.  . niacin (NIASPAN) 1000 MG CR tablet Take 1 tablet (1,000 mg total) by mouth at bedtime.  . valACYclovir (VALTREX) 1000 MG tablet Take 1 tablet (1,000 mg total) by mouth daily.  . Vitamin D, Ergocalciferol, (DRISDOL) 50000 units CAPS capsule TAKE 1 CAPSULE EVERY 7 DAYS  . [DISCONTINUED] meclizine (ANTIVERT) 12.5 MG tablet TAKE ONE TABLET BY MOUTH THREE TIMES DAILY AS NEEDED FOR DIZZINESS (Patient not taking: Reported on 12/23/2016)  . [DISCONTINUED] Naproxen Sodium 220 MG CAPS Take 220 mg by mouth as needed.   No facility-administered encounter medications on file as of 01/28/2017.     Activities of Daily Living In your present state of health, do you have any difficulty performing the following activities: 01/28/2017 09/04/2016  Hearing? Y N  Vision? Y N  Difficulty concentrating or making decisions? N N  Walking or climbing stairs? N N  Dressing or bathing? N N  Doing errands, shopping? N N  Some recent data might be hidden    trouble hearing high pitch noises and hearing aids do not help He wears reader now - vision is better since cataracts were removed.   Patient Care Team: Chipper Herb, MD as PCP - General (Family Medicine) Minus Breeding, MD as Consulting Physician (Cardiology) Ladene Artist, MD as Consulting Physician (Gastroenterology) Sandford Craze, MD as Referring Physician (Unknown Physician Specialty) Suella Broad, MD as Consulting Physician (Physical Medicine and Rehabilitation) Latanya Maudlin, MD as Consulting Physician (Orthopedic Surgery) Adelina Mings Margret Chance, MD as Referring Physician (Optometry)   Assessment:    Exercise Activities and Dietary recommendations    Goals    . Increase physical activity          Work on Personnel officer     . Prevent Falls          Has dizziness - wants to figure out what is causing it.       Fall Risk Fall Risk  01/28/2017 09/04/2016 02/27/2016 10/09/2015 05/23/2015  Falls in the past year? Yes No No No No  Number falls in past yr: 1 - - - -  Injury with Fall? Yes - - - -  this fall was due to a dizzy episode  Depression Screen PHQ 2/9 Scores 01/28/2017 12/23/2016 09/04/2016 02/27/2016  PHQ - 2 Score 0 0 0 0    Cognitive Function MMSE - Mini Mental State Exam 01/28/2017 01/02/2015  Orientation to time 5 5  Orientation to Place 5 5  Registration 3 3  Attention/ Calculation 5 5  Recall 3 3  Language- name 2 objects 2 2  Language- repeat 1 1  Language- follow 3 step command 3 3  Language- read & follow direction 1 1  Write a sentence 1 1  Copy design 1 1  Total score 30 30    MMSE score today is 30/30.    Immunization History  Administered Date(s) Administered  . Influenza,inj,Quad PF,36+ Mos 08/03/2013, 07/10/2014, 07/09/2015, 07/08/2016  . Pneumococcal Conjugate-13 07/20/2014  . Pneumococcal Polysaccharide-23 07/06/2012  . Tdap 06/11/2009  . Zoster 01/27/2013   we discussed new shingles vaccine and he will check with [employer] about  the coverage.  Screening Tests Health Maintenance  Topic Date Due  . INFLUENZA VACCINE  05/06/2017  . TETANUS/TDAP  06/12/2019  . COLONOSCOPY  01/25/2020  . Hepatitis C Screening  Completed  . PNA vac Low Risk Adult  Completed  Plan:     Pt is to bring [place] a copy of the advanced directives. He is to follow up with Dr Laurance Flatten and other specialist We will arrange a visit with Dr Katy Fitch - eye care At his next OV with [facility] - we will do a PSA and rectal exam and give the shingrix if pt wants it.  During the course of the visit the patient was educated and counseled about the following appropriate screening and preventive services:   Vaccines to include Pneumoccal, Influenza, Hepatitis B, Td, Zostavax, HCV  Electrocardiogram  Cardiovascular Disease  Colorectal cancer screening  Diabetes screening  Prostate Cancer Screening  Glaucoma screening  Nutrition counseling   Smoking cessation counseling  Patient Instructions (the written plan) was given to the patient.    Markiah Janeway, Cameron Proud, LPN  4/38/8875  I have reviewed and agree with the above AWV documentation.   Mary-Margaret Hassell Done, FNP

## 2017-01-28 NOTE — Patient Instructions (Addendum)
  Samuel Wheeler , Thank you for taking time to come for your Medicare Wellness Visit. I appreciate your ongoing commitment to your health goals. Please review the following plan we discussed and let me know if I can assist you in the future.   These are the goals we discussed: Goals    . Increase physical activity          Work on Personnel officer     . Prevent Falls          Has dizziness - wants to figure out what is causing it.        This is a list of the screening recommended for you and due dates:  Health Maintenance  Topic Date Due  . Flu Shot  05/06/2017  . Tetanus Vaccine  06/12/2019  . Colon Cancer Screening  01/25/2020  .  Hepatitis C: One time screening is recommended by Center for Disease Control  (CDC) for  adults born from [birth year] through 1964.   Completed  . Pneumonia vaccines  Completed   Keep follow up with Dr Laurance Flatten and other specialist We will arrange a eye exam for you - Dr Katy Fitch Bring [place] a copy of your advanced directives Check on the cost of [facility]

## 2017-02-05 ENCOUNTER — Other Ambulatory Visit: Payer: Self-pay | Admitting: Family Medicine

## 2017-02-23 ENCOUNTER — Other Ambulatory Visit: Payer: Self-pay | Admitting: Family Medicine

## 2017-03-04 ENCOUNTER — Encounter: Payer: Self-pay | Admitting: Family Medicine

## 2017-03-04 ENCOUNTER — Ambulatory Visit (INDEPENDENT_AMBULATORY_CARE_PROVIDER_SITE_OTHER): Payer: Medicare Other | Admitting: Family Medicine

## 2017-03-04 VITALS — BP 131/71 | HR 64 | Temp 97.8°F | Ht 69.0 in | Wt 218.0 lb

## 2017-03-04 DIAGNOSIS — E559 Vitamin D deficiency, unspecified: Secondary | ICD-10-CM

## 2017-03-04 DIAGNOSIS — Z8249 Family history of ischemic heart disease and other diseases of the circulatory system: Secondary | ICD-10-CM | POA: Diagnosis not present

## 2017-03-04 DIAGNOSIS — R42 Dizziness and giddiness: Secondary | ICD-10-CM | POA: Diagnosis not present

## 2017-03-04 DIAGNOSIS — Z800 Family history of malignant neoplasm of digestive organs: Secondary | ICD-10-CM

## 2017-03-04 DIAGNOSIS — Z23 Encounter for immunization: Secondary | ICD-10-CM | POA: Diagnosis not present

## 2017-03-04 DIAGNOSIS — Z6832 Body mass index (BMI) 32.0-32.9, adult: Secondary | ICD-10-CM

## 2017-03-04 DIAGNOSIS — N400 Enlarged prostate without lower urinary tract symptoms: Secondary | ICD-10-CM | POA: Diagnosis not present

## 2017-03-04 DIAGNOSIS — E7800 Pure hypercholesterolemia, unspecified: Secondary | ICD-10-CM | POA: Diagnosis not present

## 2017-03-04 LAB — MICROSCOPIC EXAMINATION
Bacteria, UA: NONE SEEN
EPITHELIAL CELLS (NON RENAL): NONE SEEN /HPF (ref 0–10)
RBC, UA: NONE SEEN /hpf (ref 0–?)
RENAL EPITHEL UA: NONE SEEN /HPF
WBC, UA: NONE SEEN /hpf (ref 0–?)

## 2017-03-04 LAB — URINALYSIS, COMPLETE
BILIRUBIN UA: NEGATIVE
Glucose, UA: NEGATIVE
Ketones, UA: NEGATIVE
LEUKOCYTES UA: NEGATIVE
Nitrite, UA: NEGATIVE
PH UA: 5.5 (ref 5.0–7.5)
Protein, UA: NEGATIVE
RBC UA: NEGATIVE
Specific Gravity, UA: 1.025 (ref 1.005–1.030)
Urobilinogen, Ur: 0.2 mg/dL (ref 0.2–1.0)

## 2017-03-04 MED ORDER — ATORVASTATIN CALCIUM 20 MG PO TABS: 20.0000 mg | ORAL_TABLET | Freq: Every day | ORAL | 3 refills | Status: DC

## 2017-03-04 MED ORDER — VITAMIN D (ERGOCALCIFEROL) 1.25 MG (50000 UNIT) PO CAPS: ORAL_CAPSULE | ORAL | 3 refills | Status: DC

## 2017-03-04 MED ORDER — FLUTICASONE PROPIONATE 50 MCG/ACT NA SUSP: 2.0000 | Freq: Every day | NASAL | 3 refills | Status: DC

## 2017-03-04 MED ORDER — DOXAZOSIN MESYLATE 2 MG PO TABS: 2.0000 mg | ORAL_TABLET | Freq: Every day | ORAL | 3 refills | Status: DC

## 2017-03-04 MED ORDER — FINASTERIDE 5 MG PO TABS: 5.0000 mg | ORAL_TABLET | Freq: Every day | ORAL | 3 refills | Status: DC

## 2017-03-04 MED ORDER — ALBUTEROL SULFATE HFA 108 (90 BASE) MCG/ACT IN AERS: INHALATION_SPRAY | RESPIRATORY_TRACT | 3 refills | Status: DC

## 2017-03-04 MED ORDER — NIACIN ER (ANTIHYPERLIPIDEMIC) 1000 MG PO TBCR: 1000.0000 mg | EXTENDED_RELEASE_TABLET | Freq: Every day | ORAL | 3 refills | Status: DC

## 2017-03-04 NOTE — Progress Notes (Signed)
Subjective:    Patient ID: Samuel Wheeler, male    DOB: 04/15/1946, 71 y.o.   MRN: [id number]  HPI  Pt here for follow up and management of chronic medical problems which includes hyperlipidemia. He is taking medication regularly.The patient is doing well overall. He does complain with some dizziness at times and some fatigue. He is requesting refills on all his medicines. He also wants to get the new shingles shot. The patient denies any chest pain or shortness of breath. He denies any trouble with his stomach including nausea vomiting diarrhea blood in the stool or black tarry bowel movements. He is passing his water without problems. His last colonoscopy was in April 2016 and because of the family history with his sister having colon cancer he will get another colonoscopy in 2021. He has had ongoing issues with dizziness as mentioned. One episode was so bad that he fell and cut his for head. He subsequently went to see an ear nose and throat specialist but nothing was found that was significant with the evaluation that was done. He has this dizziness mostly when he is standing or when he is laying in the bed. It only lasts for very short period time and it is over. Only the severe episode about 3 months ago made him fall and that is when he had the laceration to [place] for head. The patient has been more stressed recently and that his wife is been diagnosed with stage IV lung cancer and she has had a stroke.    Patient Active Problem List   Diagnosis Date Noted  . PVC's (premature ventricular contractions) 11/01/2014  . 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 HLD (hyperlipidemia) 01/27/2013  . BPH (benign prostatic hyperplasia) 01/27/2013  . Erectile dysfunction 01/27/2013  . Genital herpes simplex 01/27/2013  . Actinic keratosis 01/27/2013  . Need for zoster vaccination 01/27/2013   Outpatient Encounter Prescriptions as of 03/04/2017  Medication Sig  . aspirin 81 MG chewable tablet Chew 81 mg by  mouth daily.  Marland Kitchen atorvastatin (LIPITOR) 20 MG tablet Take 1 tablet (20 mg total) by mouth daily.  . cetirizine (ZYRTEC) 10 MG tablet TAKE 1 TABLET DAILY  . DimenhyDRINATE (DRAMAMINE PO) Take by mouth.  . doxazosin (CARDURA) 2 MG tablet Take 1 tablet (2 mg total) by mouth at bedtime.  . finasteride (PROSCAR) 5 MG tablet Take 1 tablet (5 mg total) by mouth daily.  . fluticasone (FLONASE) 50 MCG/ACT nasal spray Place 2 sprays into both nostrils daily.  . niacin (NIASPAN) 1000 MG CR tablet Take 1 tablet (1,000 mg total) by mouth at bedtime.  Marland Kitchen PROAIR HFA 108 (90 Base) MCG/ACT inhaler USE 2 INHALATIONS EVERY 6 HOURS AS NEEDED FOR WHEEZING OR SHORTNESS OF BREATH  . valACYclovir (VALTREX) 1000 MG tablet Take 1 tablet (1,000 mg total) by mouth daily.  . Vitamin D, Ergocalciferol, (DRISDOL) 50000 units CAPS capsule TAKE 1 CAPSULE EVERY 7 DAYS   No facility-administered encounter medications on file as of 03/04/2017.       Review of Systems  Constitutional: Positive for fatigue.  HENT: Negative.   Eyes: Negative.   Respiratory: Negative.   Cardiovascular: Negative.   Gastrointestinal: Negative.   Endocrine: Negative.   Genitourinary: Negative.   Musculoskeletal: Negative.   Skin: Negative.   Allergic/Immunologic: Negative.   Neurological: Positive for dizziness (at times).  Hematological: Negative.   Psychiatric/Behavioral: Negative.        Objective:   Physical Exam  Constitutional: He  is oriented to person, place, and time. He appears well-developed and well-nourished. No distress.  The patient is pleasant and relaxed  HENT:  Head: Normocephalic and atraumatic.  Right Ear: External ear normal.  Left Ear: External ear normal.  Mouth/Throat: Oropharynx is clear and moist. No oropharyngeal exudate.  Slight nasal congestion  Eyes: Conjunctivae and EOM are normal. Pupils are equal, round, and reactive to light. Right eye exhibits no discharge. Left eye exhibits no discharge. No scleral  icterus.  Neck: Normal range of motion. Neck supple. No thyromegaly present.  No bruits thyromegaly or anterior cervical adenopathy  Cardiovascular: Normal rate, regular rhythm, normal heart sounds and intact distal pulses.   No murmur heard. Heart is regular at 72/m  Pulmonary/Chest: Effort normal and breath sounds normal. No respiratory distress. He has no wheezes. He has no rales. He exhibits no tenderness.  Clear anteriorly and posteriorly and no axillary adenopathy  Abdominal: Soft. Bowel sounds are normal. He exhibits no mass. There is no tenderness. There is no rebound and no guarding.  No liver or spleen enlargement no bruits no inguinal adenopathy and no masses or organ enlargement.  Genitourinary: Rectum normal and penis normal.  Genitourinary Comments: The prostate is slightly enlarged but smooth. There were no rectal masses. The external genitalia were within normal limits and no hernias were palpated and there were no inguinal nodes.  Musculoskeletal: Normal range of motion. He exhibits no edema or tenderness.  Lymphadenopathy:    He has no cervical adenopathy.  Neurological: He is alert and oriented to person, place, and time. He has normal reflexes. No cranial nerve deficit.  Skin: Skin is warm and dry. No rash noted.  Psychiatric: He has a normal mood and affect. His behavior is normal. Judgment and thought content normal.  Nursing note and vitals reviewed.   BP 140/83 (BP Location: Left Arm)   Pulse 64   Temp 97.8 F (36.6 C) (Oral)   Ht '5\' 9"'  (1.753 m)   Wt 218 lb (98.9 kg)   BMI 32.19 kg/m        Assessment & Plan:  1. Pure hypercholesterolemia -The patient should continue with his current treatment of atorvastatin and Niaspan and with as aggressive therapeutic lifestyle changes as possible with all efforts made to lose weight. - BMP8+EGFR - CBC with Differential/Platelet - Hepatic function panel - NMR, lipoprofile  2. Family history of colon cancer -The  patient's next colonoscopy is due in April 2021 because of his sister having colon cancer. - CBC with Differential/Platelet  3. Vitamin D deficiency -Continue vitamin D replacement pending results of lab work - CBC with Differential/Platelet - VITAMIN D 25 Hydroxy (Vit-D Deficiency, Fractures)  4. Benign prostatic hyperplasia, unspecified whether lower urinary tract symptoms present -The patient describes no symptoms with passing his water. His prostate was enlarged but soft and smooth. There were no rectal masses. - CBC with Differential/Platelet - PSA, total and free - Urinalysis, Complete  5. Family history of heart disease -Continue with aggressive therapeutic lifestyle changes and current efforts at keeping cholesterol down - BMP8+EGFR - CBC with Differential/Platelet - Hepatic function panel  6. BMI 32.0-32.9,adult -The patient's BMI was discussed with him today he needs to make all efforts at losing weight through diet and exercise and he understands the importance of this.  Meds ordered this encounter  Medications  . DimenhyDRINATE (DRAMAMINE PO)    Sig: Take by mouth.  . Vitamin D, Ergocalciferol, (DRISDOL) 50000 units CAPS capsule  Sig: TAKE 1 CAPSULE EVERY 7 DAYS    Dispense:  12 capsule    Refill:  3  . albuterol (PROAIR HFA) 108 (90 Base) MCG/ACT inhaler    Sig: USE 2 INHALATIONS EVERY 6 HOURS AS NEEDED FOR WHEEZING OR SHORTNESS OF BREATH    Dispense:  51 g    Refill:  3  . niacin (NIASPAN) 1000 MG CR tablet    Sig: Take 1 tablet (1,000 mg total) by mouth at bedtime.    Dispense:  90 tablet    Refill:  3  . fluticasone (FLONASE) 50 MCG/ACT nasal spray    Sig: Place 2 sprays into both nostrils daily.    Dispense:  48 g    Refill:  3  . finasteride (PROSCAR) 5 MG tablet    Sig: Take 1 tablet (5 mg total) by mouth daily.    Dispense:  90 tablet    Refill:  3  . atorvastatin (LIPITOR) 20 MG tablet    Sig: Take 1 tablet (20 mg total) by mouth daily.     Dispense:  90 tablet    Refill:  3  . doxazosin (CARDURA) 2 MG tablet    Sig: Take 1 tablet (2 mg total) by mouth at bedtime.    Dispense:  90 tablet    Refill:  3   Patient Instructions                       Medicare Annual Wellness Visit  [facility] and the medical providers at [facility] strive to bring you the best medical care.  In doing so we not only want to address your current medical conditions and concerns but also to detect new conditions early and prevent illness, disease and health-related problems.    Medicare offers a yearly Wellness Visit which allows our clinical staff to assess your need for preventative services including immunizations, lifestyle education, counseling to decrease risk of preventable diseases and screening for fall risk and other medical concerns.    This visit is provided free of charge (no copay) for all Medicare recipients. The clinical pharmacists at [facility] have begun to conduct these Wellness Visits which will also include a thorough review of all your medications.    As you primary medical provider recommend that you make an appointment for your Annual Wellness Visit if you have not done so already this year.  You may set up this appointment before you leave today or you may call back ([phone]) and schedule an appointment.  Please make sure when you call that you mention that you are scheduling your Annual Wellness Visit with the clinical pharmacist so that the appointment may be made for the proper length of time.     Continue current medications. Continue good therapeutic lifestyle changes which include good diet and exercise. Fall precautions discussed with patient. If an FOBT was given today- please return it to our front desk. If you are over 88 years old - you may need Prevnar 68 or the adult Pneumonia vaccine.  **Flu shots are available--- please call and schedule a FLU-CLINIC  appointment**  After your visit with [place] today you will receive a survey in the mail or online from [employer] regarding your care with [place]. Please take a moment to fill this out. Your feedback is very important to [place] as you can help [place] better understand your patient needs as well as improve your experience and  satisfaction. WE CARE ABOUT YOU!!!  If the episodes of dizziness continue, please get back in touch with [place] and we will make arrangements to evaluate this further possibly with the neurologist. Always be careful and do not put yourself at risk for falling. Avoid climbing ladders. If the episodes begin to occur more frequently please get back in touch with [place] at that time. The patient should continue to make all efforts to lose weight drink more water and eat less bread and carbs We will get him an appointment with the neurologist at a convenient time because of his wife's illness midmorning in 6 weeks or so to follow-up on the episodes of dizziness.   Arrie Senate MD

## 2017-03-04 NOTE — Patient Instructions (Addendum)
Medicare Annual Wellness Visit  [facility] and the medical providers at [facility] strive to bring you the best medical care.  In doing so we not only want to address your current medical conditions and concerns but also to detect new conditions early and prevent illness, disease and health-related problems.    Medicare offers a yearly Wellness Visit which allows our clinical staff to assess your need for preventative services including immunizations, lifestyle education, counseling to decrease risk of preventable diseases and screening for fall risk and other medical concerns.    This visit is provided free of charge (no copay) for all Medicare recipients. The clinical pharmacists at [facility] have begun to conduct these Wellness Visits which will also include a thorough review of all your medications.    As you primary medical provider recommend that you make an appointment for your Annual Wellness Visit if you have not done so already this year.  You may set up this appointment before you leave today or you may call back ([phone]) and schedule an appointment.  Please make sure when you call that you mention that you are scheduling your Annual Wellness Visit with the clinical pharmacist so that the appointment may be made for the proper length of time.     Continue current medications. Continue good therapeutic lifestyle changes which include good diet and exercise. Fall precautions discussed with patient. If an FOBT was given today- please return it to our front desk. If you are over 30 years old - you may need Prevnar 72 or the adult Pneumonia vaccine.  **Flu shots are available--- please call and schedule a FLU-CLINIC appointment**  After your visit with [place] today you will receive a survey in the mail or online from [employer] regarding your care with [place]. Please take a moment to fill this out. Your feedback is very  important to [place] as you can help [place] better understand your patient needs as well as improve your experience and satisfaction. WE CARE ABOUT YOU!!!  If the episodes of dizziness continue, please get back in touch with [place] and we will make arrangements to evaluate this further possibly with the neurologist. Always be careful and do not put yourself at risk for falling. Avoid climbing ladders. If the episodes begin to occur more frequently please get back in touch with [place] at that time. The patient should continue to make all efforts to lose weight drink more water and eat less bread and carbs We will get him an appointment with the neurologist at a convenient time because of his wife's illness midmorning in 6 weeks or so to follow-up on the episodes of dizziness.

## 2017-03-05 LAB — CBC WITH DIFFERENTIAL/PLATELET
BASOS ABS: 0 10*3/uL (ref 0.0–0.2)
Basos: 0 %
EOS (ABSOLUTE): 0.3 10*3/uL (ref 0.0–0.4)
Eos: 4 %
HEMOGLOBIN: 14.3 g/dL (ref 13.0–17.7)
Hematocrit: 44.1 % (ref 37.5–51.0)
Immature Grans (Abs): 0 10*3/uL (ref 0.0–0.1)
Immature Granulocytes: 0 %
LYMPHS ABS: 3.1 10*3/uL (ref 0.7–3.1)
Lymphs: 42 %
MCH: 29.9 pg (ref 26.6–33.0)
MCHC: 32.4 g/dL (ref 31.5–35.7)
MCV: 92 fL (ref 79–97)
Monocytes Absolute: 0.4 10*3/uL (ref 0.1–0.9)
Monocytes: 6 %
NEUTROS ABS: 3.4 10*3/uL (ref 1.4–7.0)
Neutrophils: 48 %
Platelets: 193 10*3/uL (ref 150–379)
RBC: 4.78 x10E6/uL (ref 4.14–5.80)
RDW: 14.3 % (ref 12.3–15.4)
WBC: 7.3 10*3/uL (ref 3.4–10.8)

## 2017-03-05 LAB — NMR, LIPOPROFILE
CHOLESTEROL: 127 mg/dL (ref 100–199)
HDL Cholesterol by NMR: 40 mg/dL (ref >39)
HDL PARTICLE NUMBER: 26.4 umol/L — AB (ref >=30.5)
LDL Particle Number: 982 nmol/L (ref <1000)
LDL SIZE: 20.5 nm (ref >20.5)
LDL-C: 68 mg/dL (ref 0–99)
LP-IR Score: 56 — ABNORMAL HIGH (ref <=45)
SMALL LDL PARTICLE NUMBER: 532 nmol/L — AB (ref <=527)
TRIGLYCERIDES BY NMR: 95 mg/dL (ref 0–149)

## 2017-03-05 LAB — HEPATIC FUNCTION PANEL
ALK PHOS: 71 IU/L (ref 39–117)
ALT: 21 IU/L (ref 0–44)
AST: 18 IU/L (ref 0–40)
Albumin: 4.1 g/dL (ref 3.5–4.8)
Bilirubin Total: 0.6 mg/dL (ref 0.0–1.2)
Bilirubin, Direct: 0.15 mg/dL (ref 0.00–0.40)
Total Protein: 6.6 g/dL (ref 6.0–8.5)

## 2017-03-05 LAB — BMP8+EGFR
BUN / CREAT RATIO: 20 (ref 10–24)
BUN: 17 mg/dL (ref 8–27)
CALCIUM: 8.7 mg/dL (ref 8.6–10.2)
CO2: 25 mmol/L (ref 18–29)
Chloride: 104 mmol/L (ref 96–106)
Creatinine, Ser: 0.87 mg/dL (ref 0.76–1.27)
GFR calc non Af Amer: 87 mL/min/{1.73_m2} (ref >59)
GFR, EST AFRICAN AMERICAN: 101 mL/min/{1.73_m2} (ref >59)
Glucose: 97 mg/dL (ref 65–99)
POTASSIUM: 4.4 mmol/L (ref 3.5–5.2)
Sodium: 142 mmol/L (ref 134–144)

## 2017-03-05 LAB — PSA, TOTAL AND FREE
PROSTATE SPECIFIC AG, SERUM: 0.5 ng/mL (ref 0.0–4.0)
PSA, Free Pct: 14 %
PSA, Free: 0.07 ng/mL

## 2017-03-05 LAB — VITAMIN D 25 HYDROXY (VIT D DEFICIENCY, FRACTURES): Vit D, 25-Hydroxy: 40.8 ng/mL (ref 30.0–100.0)

## 2017-03-06 ENCOUNTER — Other Ambulatory Visit: Payer: Medicare Other

## 2017-03-06 DIAGNOSIS — Z1211 Encounter for screening for malignant neoplasm of colon: Secondary | ICD-10-CM

## 2017-03-07 ENCOUNTER — Other Ambulatory Visit: Payer: Self-pay | Admitting: Family Medicine

## 2017-03-08 LAB — FECAL OCCULT BLOOD, IMMUNOCHEMICAL: FECAL OCCULT BLD: NEGATIVE

## 2017-03-12 ENCOUNTER — Other Ambulatory Visit: Payer: Self-pay | Admitting: *Deleted

## 2017-03-12 MED ORDER — VALACYCLOVIR HCL 1 G PO TABS: 1000.0000 mg | ORAL_TABLET | Freq: Every day | ORAL | 3 refills | Status: DC

## 2017-03-12 NOTE — Telephone Encounter (Signed)
Last refill was 02/27/2016

## 2017-05-12 ENCOUNTER — Encounter: Payer: Self-pay | Admitting: Neurology

## 2017-05-12 ENCOUNTER — Ambulatory Visit (INDEPENDENT_AMBULATORY_CARE_PROVIDER_SITE_OTHER): Payer: Medicare Other | Admitting: Neurology

## 2017-05-12 VITALS — BP 138/85 | HR 66 | Ht 70.0 in | Wt 219.0 lb

## 2017-05-12 DIAGNOSIS — A881 Epidemic vertigo: Secondary | ICD-10-CM

## 2017-05-12 DIAGNOSIS — R42 Dizziness and giddiness: Secondary | ICD-10-CM

## 2017-05-12 NOTE — Patient Instructions (Signed)
   We will check MRI of the brain, if the dizziness comes back, please call our office.

## 2017-05-12 NOTE — Progress Notes (Signed)
Reason for visit: Dizziness  Referring physician: Dr. [name] is a 71 y.o. male  History of present illness:  [name] is a 71 year old right-handed white male with a history of episodic dizziness and vertigo that began around September 2017. The patient had his first severe episode of vertigo on 12/16/2016 and he fell and sustained a laceration to the left brow. The patient has had episodes of vertigo that usually occur while lying down or with getting up from a lying position that may last anywhere from several minutes to up to 3 days. The episodes are not associated with nausea or vomiting. He does have a spinning sensation, he reports some bilateral tinnitus and decreased hearing that predates the onset of the vertigo. He has not had any alteration in hearing with the episodes of vertigo. He has taken Antivert or Dramamine with good improvement. The patient has not had any problems with vertigo for 2 months. He reports no other associated symptoms such as syncope, double vision, slurred speech, numbness or weakness of the face, arms, or legs. The patient does not have gait instability between episodes of vertigo. He reports no problems with controlling the bowels or the bladder. He does have some low back pain at times. He is sent to this office for an evaluation. He has seen an ENT physician, and no etiology of his dizziness was noted. He has had a CT scan of the brain that did not show any acute changes, he has not had MRI brain evaluation.  Past Medical History:  Diagnosis Date  . Actinic keratosis   . Allergy    Year Round   . Arthritis   . BPH (benign prostatic hyperplasia)   . Cancer ([facility])    skin cancer - removed several times  . Cataract 2015   Right Eye   . ED (erectile dysfunction)   . Hyperlipidemia   . Hypertension    pt denies    Past Surgical History:  Procedure Laterality Date  . CERVICAL SPINE SURGERY    . COLONOSCOPY  2009   tics only  . right  shoulder    . skin cancer removal     right ear x 2    Family History  Problem Relation Age of Onset  . Stroke Mother 56  . Stroke Father 4  . Diabetes Father   . COPD Father   . Heart disease Sister        "Mild heart attack", pacemaker  . Stroke Brother 99  . Syncope episode Brother   . Parkinson's disease Sister   . Cancer Sister 30       Colon Cancer  . Cancer Sister        Skin Cancers  . Cancer Sister        Melanoma  . Cancer Sister        Skin Cancer  . Cancer Maternal Aunt        bone    Social history:  reports that he has never smoked. He has never used smokeless tobacco. He reports that he does not drink alcohol or use drugs.  Medications:  Prior to Admission medications   Medication Sig Start Date End Date Taking? Authorizing Provider  albuterol (PROAIR HFA) 108 (90 Base) MCG/ACT inhaler USE 2 INHALATIONS EVERY 6 HOURS AS NEEDED FOR WHEEZING OR SHORTNESS OF BREATH 03/04/17  Yes Chipper Herb, MD  aspirin 81 MG chewable tablet Chew 81 mg by mouth daily.  Yes [provider]  atorvastatin (LIPITOR) 20 MG tablet Take 1 tablet (20 mg total) by mouth daily. 03/04/17  Yes Chipper Herb, MD  cetirizine (ZYRTEC) 10 MG tablet TAKE 1 TABLET DAILY 02/24/17  Yes Chipper Herb, MD  DimenhyDRINATE (DRAMAMINE PO) Take 1 Dose by mouth as needed.    Yes [provider]  doxazosin (CARDURA) 2 MG tablet Take 1 tablet (2 mg total) by mouth at bedtime. 03/04/17  Yes Chipper Herb, MD  finasteride (PROSCAR) 5 MG tablet Take 1 tablet (5 mg total) by mouth daily. 03/04/17  Yes Chipper Herb, MD  fluticasone [facility]) 50 MCG/ACT nasal spray Place 2 sprays into both nostrils daily. 03/04/17  Yes Chipper Herb, MD  niacin (NIASPAN) 1000 MG CR tablet Take 1 tablet (1,000 mg total) by mouth at bedtime. 03/04/17  Yes Chipper Herb, MD  valACYclovir (VALTREX) 1000 MG tablet Take 1 tablet (1,000 mg total) by mouth daily. 03/12/17  Yes Chipper Herb, MD  Vitamin D,  Ergocalciferol, (DRISDOL) 50000 units CAPS capsule TAKE 1 CAPSULE EVERY 7 DAYS 03/04/17  Yes Chipper Herb, MD      Allergies  Allergen Reactions  . Poison Ivy Extract     ROS:  Out of a complete 14 system review of symptoms, the patient complains only of the following symptoms, and all other reviewed systems are negative.  Ringing in the ears, hearing loss, dizziness Wheezing, snoring Joint pain, achy muscles Impotence Allergies Decreased energy  Blood pressure 138/85, pulse 66, height 5\' 10"  (1.778 m), weight 219 lb (99.3 kg).  Physical Exam  General: The patient is alert and cooperative at the time of the examination. The patient is moderately obese.  Eyes: Pupils are equal, round, and reactive to light. Discs are flat bilaterally.  Ears: Tympanic membranes are clear bilaterally.  Neck: The neck is supple, no carotid bruits are noted.  Respiratory: The respiratory examination is clear.  Cardiovascular: The cardiovascular examination reveals a regular rate and rhythm, no obvious murmurs or rubs are noted.  Skin: Extremities are without significant edema.  Neurologic Exam  Mental status: The patient is alert and oriented x 3 at the time of the examination. The patient has apparent normal recent and remote memory, with an apparently normal attention span and concentration ability.  Cranial nerves: Facial symmetry is present. There is good sensation of the face to pinprick and soft touch bilaterally. The strength of the facial muscles and the muscles to head turning and shoulder shrug are normal bilaterally. Speech is well enunciated, no aphasia or dysarthria is noted. Extraocular movements are full. Visual fields are full. The tongue is midline, and the patient has symmetric elevation of the soft palate. No obvious hearing deficits are noted.  Motor: The motor testing reveals 5 over 5 strength of all 4 extremities. Good symmetric motor tone is noted  throughout.  Sensory: Sensory testing is intact to pinprick, soft touch, vibration sensation, and position sense on all 4 extremities. No evidence of extinction is noted.  Coordination: Cerebellar testing reveals good finger-nose-finger and heel-to-shin bilaterally. The Nyan-Barany procedure was done, some mild end-gaze nystagmus is seen bilaterally, the patient reported no vertigo.  Gait and station: Gait is normal. Tandem gait is normal. Romberg is negative. No drift is seen.  Reflexes: Deep tendon reflexes are symmetric and normal bilaterally. Toes are downgoing bilaterally.   CT head 12/16/16:  IMPRESSION: 1. No acute intracranial abnormality. 2. Left floor of orbit fracture with 5  mm inferior herniation of extraconal fat. No appreciable herniation of extraocular muscles. 3. Left frontal scalp and periorbital soft tissue contusion.   * CT scan images were reviewed online. I agree with the written report.    Assessment/Plan:  1. Episodic vertigo, possible benign positional vertigo  The patient will be sent for MRI of the brain, and this is unremarkable, the patient will call our office if he becomes symptomatic again, he may benefit from vestibular rehabilitation. Currently, the patient is completely asymptomatic.   Jill Alexanders MD 05/12/2017 2:04 PM  [facility] [address] [place], [place] [address]  Phone [phone] Fax [phone]

## 2017-06-01 ENCOUNTER — Ambulatory Visit
Admission: RE | Admit: 2017-06-01 | Discharge: 2017-06-01 | Disposition: A | Discharge status: Home / Self Care | Payer: Medicare Other | Source: Ambulatory Visit | Attending: Neurology | Admitting: Neurology

## 2017-06-01 DIAGNOSIS — R42 Dizziness and giddiness: Secondary | ICD-10-CM

## 2017-06-01 DIAGNOSIS — A881 Epidemic vertigo: Secondary | ICD-10-CM

## 2017-06-02 ENCOUNTER — Telehealth: Payer: Self-pay | Admitting: Neurology

## 2017-06-02 NOTE — Telephone Encounter (Signed)
I called patient. MRI the brain is relatively unremarkable, minimal white matter changes are seen. Nothing that would cause dizziness or vertigo. The patient likely has a vestibular problem, if the episode recurs, he will call our office and we will initiate vestibular rehabilitation.   MRI brain 06/01/17:  IMPRESSION:  This MRI of the brain without contrast shows the following: 1.   Scattered T2/FLAIR hyperintense foci in the subcortical deep white matter consistent with mild age related chronic microvascular ischemic change. None of the foci appears to be acute. 2.   Sequela of a left orbital fracture also noted on the 12/16/2016 CT scan 3.   Chronic ethmoid sinusitis. 4.   The internal auditory canals and their contents appear normal per

## 2017-07-20 ENCOUNTER — Ambulatory Visit (INDEPENDENT_AMBULATORY_CARE_PROVIDER_SITE_OTHER): Payer: Medicare Other

## 2017-07-20 DIAGNOSIS — Z23 Encounter for immunization: Secondary | ICD-10-CM

## 2017-07-22 DIAGNOSIS — E119 Type 2 diabetes mellitus without complications: Secondary | ICD-10-CM | POA: Diagnosis not present

## 2017-07-22 DIAGNOSIS — H40033 Anatomical narrow angle, bilateral: Secondary | ICD-10-CM | POA: Diagnosis not present

## 2017-08-22 ENCOUNTER — Other Ambulatory Visit: Payer: Self-pay | Admitting: Family Medicine

## 2017-08-25 ENCOUNTER — Encounter: Payer: Self-pay | Admitting: Family Medicine

## 2017-08-25 ENCOUNTER — Ambulatory Visit (INDEPENDENT_AMBULATORY_CARE_PROVIDER_SITE_OTHER): Payer: Medicare Other | Admitting: Family Medicine

## 2017-08-25 VITALS — BP 140/82 | HR 62 | Temp 97.4°F | Ht 70.0 in | Wt 204.0 lb

## 2017-08-25 DIAGNOSIS — N400 Enlarged prostate without lower urinary tract symptoms: Secondary | ICD-10-CM | POA: Diagnosis not present

## 2017-08-25 DIAGNOSIS — Z23 Encounter for immunization: Secondary | ICD-10-CM | POA: Diagnosis not present

## 2017-08-25 DIAGNOSIS — E559 Vitamin D deficiency, unspecified: Secondary | ICD-10-CM

## 2017-08-25 DIAGNOSIS — E7800 Pure hypercholesterolemia, unspecified: Secondary | ICD-10-CM

## 2017-08-25 DIAGNOSIS — Z8249 Family history of ischemic heart disease and other diseases of the circulatory system: Secondary | ICD-10-CM

## 2017-08-25 DIAGNOSIS — H8110 Benign paroxysmal vertigo, unspecified ear: Secondary | ICD-10-CM | POA: Insufficient documentation

## 2017-08-25 NOTE — Progress Notes (Signed)
Subjective:    Patient ID: Samuel Wheeler, male    DOB: 1945-10-28, 71 y.o.   MRN: [id number]  HPI Pt here for follow up and management of chronic medical problems which includes hyperlipidemia. He is taking medication regularly.  The patient is doing well overall.  His most recent visits were reviewed before the visit today.  He has been to see the neurologist and was diagnosed with benign positional vertigo.  He will get his second shingles shot today.  He is currently taking atorvastatin vitamin D and allergy medicines.  He has a history of an enlarged prostate hyperlipidemia erectile dysfunction and PVCs.  He also has cold sores for which he takes Valtrex.  The patient denies any chest pain or shortness of breath.  He denies any trouble with his stomach including nausea vomiting diarrhea blood in the stool or black tarry bowel movements.  He is passing his water without problems.  He does take Cardura or doxazosin and finasteride.     Patient Active Problem List   Diagnosis Date Noted  . Benign positional vertigo 08/25/2017  . Dizziness 03/04/2017  . PVC's (premature ventricular contractions) 11/01/2014  . 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 HLD (hyperlipidemia) 01/27/2013  . BPH (benign prostatic hyperplasia) 01/27/2013  . Erectile dysfunction 01/27/2013  . Genital herpes simplex 01/27/2013  . Actinic keratosis 01/27/2013  . Need for zoster vaccination 01/27/2013   Outpatient Encounter Medications as of 08/25/2017  Medication Sig  . albuterol (PROAIR HFA) 108 (90 Base) MCG/ACT inhaler USE 2 INHALATIONS EVERY 6 HOURS AS NEEDED FOR WHEEZING OR SHORTNESS OF BREATH  . aspirin 81 MG chewable tablet Chew 81 mg by mouth daily.  Marland Kitchen atorvastatin (LIPITOR) 20 MG tablet Take 1 tablet (20 mg total) by mouth daily.  . cetirizine (ZYRTEC) 10 MG tablet TAKE 1 TABLET DAILY  . DimenhyDRINATE (DRAMAMINE PO) Take 1 Dose by mouth as needed.   . doxazosin (CARDURA) 2 MG tablet Take 1 tablet (2 mg  total) by mouth at bedtime.  . finasteride (PROSCAR) 5 MG tablet Take 1 tablet (5 mg total) by mouth daily.  . fluticasone (FLONASE) 50 MCG/ACT nasal spray Place 2 sprays into both nostrils daily.  . niacin (NIASPAN) 1000 MG CR tablet Take 1 tablet (1,000 mg total) by mouth at bedtime.  . valACYclovir (VALTREX) 1000 MG tablet Take 1 tablet (1,000 mg total) by mouth daily.  . Vitamin D, Ergocalciferol, (DRISDOL) 50000 units CAPS capsule TAKE 1 CAPSULE EVERY 7 DAYS   No facility-administered encounter medications on file as of 08/25/2017.      Review of Systems  Constitutional: Negative.   HENT: Negative.   Eyes: Negative.   Respiratory: Negative.   Cardiovascular: Negative.   Gastrointestinal: Negative.   Endocrine: Negative.   Genitourinary: Negative.   Musculoskeletal: Negative.   Skin: Negative.   Allergic/Immunologic: Negative.   Neurological: Negative.   Hematological: Negative.   Psychiatric/Behavioral: Negative.        Objective:   Physical Exam  Constitutional: He is oriented to person, place, and time. He appears well-developed and well-nourished. No distress.  The patient is pleasant calm and alert and continues to help take care of his wife who has stage IV lung cancer.  HENT:  Head: Normocephalic and atraumatic.  Right Ear: External ear normal.  Left Ear: External ear normal.  Mouth/Throat: Oropharynx is clear and moist. No oropharyngeal exudate.  Nasal turbinate congestion bilaterally  Eyes: Conjunctivae and EOM are normal. Pupils are equal,  round, and reactive to light. Right eye exhibits no discharge. Left eye exhibits no discharge. No scleral icterus.  Neck: Normal range of motion. Neck supple. No thyromegaly present.  No bruits thyromegaly or anterior cervical adenopathy  Cardiovascular: Normal rate, regular rhythm, normal heart sounds and intact distal pulses.  No murmur heard. Heart has a regular rate and rhythm at 72/min  Pulmonary/Chest: Effort normal  and breath sounds normal. No respiratory distress. He has no wheezes. He has no rales. He exhibits no tenderness.  Chest is clear anteriorly and posteriorly with no axillary adenopathy and no chest wall masses.  Abdominal: Soft. Bowel sounds are normal. He exhibits no mass. There is no tenderness. There is no rebound and no guarding.  No abdominal tenderness masses organ enlargement bruits or inguinal adenopathy.  Musculoskeletal: Normal range of motion. He exhibits no edema.  Lymphadenopathy:    He has no cervical adenopathy.  Neurological: He is alert and oriented to person, place, and time. He has normal reflexes. No cranial nerve deficit.  Skin: Skin is warm and dry. No rash noted.  Psychiatric: He has a normal mood and affect. His behavior is normal. Judgment and thought content normal.  Nursing note and vitals reviewed.  BP 140/82 (BP Location: Left Arm)   Pulse 62   Temp (!) 97.4 F (36.3 C) (Oral)   Ht _0  (1.778 m)   Wt 204 lb (92.5 kg)   BMI 29.27 kg/m         Assessment & Plan:  1. Vitamin D deficiency -Continue with current treatment pending results of lab work - CBC with Differential/Platelet - VITAMIN D 25 Hydroxy (Vit-D Deficiency, Fractures)  2. Pure hypercholesterolemia -Continue with aggressive therapeutic lifestyle changes including diet and exercise and current treatment pending results of lab work - BMP8+EGFR - CBC with Differential/Platelet - Lipid panel - Hepatic function panel  3. Family history of heart disease -Stay active physically take cholesterol medicine regularly and watch diet closely - CBC with Differential/Platelet - Lipid panel - Hepatic function panel  4. Benign prostatic hyperplasia, unspecified whether lower urinary tract symptoms present -The patient currently takes finasteride and Cardura and will continue with his treatment and has no complaints with voiding today. - CBC with Differential/Platelet  5. Need for shingles  vaccine -He will get the second immunization of his shingles shot today. - CBC with Differential/Platelet   Patient Instructions                       Medicare Annual Wellness Visit  [facility] and the medical providers at [facility] strive to bring you the best medical care.  In doing so we not only want to address your current medical conditions and concerns but also to detect new conditions early and prevent illness, disease and health-related problems.    Medicare offers a yearly Wellness Visit which allows our clinical staff to assess your need for preventative services including immunizations, lifestyle education, counseling to decrease risk of preventable diseases and screening for fall risk and other medical concerns.    This visit is provided free of charge (no copay) for all Medicare recipients. The clinical pharmacists at [facility] have begun to conduct these Wellness Visits which will also include a thorough review of all your medications.    As you primary medical provider recommend that you make an appointment for your Annual Wellness Visit if you have not done so already this year.  You may set up this appointment before you leave today or you may call back ([phone]) and schedule an appointment.  Please make sure when you call that you mention that you are scheduling your Annual Wellness Visit with the clinical pharmacist so that the appointment may be made for the proper length of time.     Continue current medications. Continue good therapeutic lifestyle changes which include good diet and exercise. Fall precautions discussed with patient. If an FOBT was given today- please return it to our front desk. If you are over 27 years old - you may need Prevnar 27 or the adult Pneumonia vaccine.  **Flu shots are available--- please call and schedule a FLU-CLINIC appointment**  After your visit with [place] today you will receive a survey  in the mail or online from [employer] regarding your care with [place]. Please take a moment to fill this out. Your feedback is very important to [place] as you can help [place] better understand your patient needs as well as improve your experience and satisfaction. WE CARE ABOUT YOU!!!   Continue with aggressive therapeutic lifestyle changes which include diet and exercise Stop the use of the overhead fan and use nasal saline for nasal congestion and continue with Flonase Drink plenty of fluids this winter and stay well-hydrated and keep the house as cool as possible  Arrie Senate MD

## 2017-08-25 NOTE — Patient Instructions (Addendum)
Medicare Annual Wellness Visit  [facility] and the medical providers at [facility] strive to bring you the best medical care.  In doing so we not only want to address your current medical conditions and concerns but also to detect new conditions early and prevent illness, disease and health-related problems.    Medicare offers a yearly Wellness Visit which allows our clinical staff to assess your need for preventative services including immunizations, lifestyle education, counseling to decrease risk of preventable diseases and screening for fall risk and other medical concerns.    This visit is provided free of charge (no copay) for all Medicare recipients. The clinical pharmacists at [facility] have begun to conduct these Wellness Visits which will also include a thorough review of all your medications.    As you primary medical provider recommend that you make an appointment for your Annual Wellness Visit if you have not done so already this year.  You may set up this appointment before you leave today or you may call back [phone]) and schedule an appointment.  Please make sure when you call that you mention that you are scheduling your Annual Wellness Visit with the clinical pharmacist so that the appointment may be made for the proper length of time.     Continue current medications. Continue good therapeutic lifestyle changes which include good diet and exercise. Fall precautions discussed with patient. If an FOBT was given today- please return it to our front desk. If you are over 68 years old - you may need Prevnar 73 or the adult Pneumonia vaccine.  **Flu shots are available--- please call and schedule a FLU-CLINIC appointment**  After your visit with [place] today you will receive a survey in the mail or online from [employer] regarding your care with [place]. Please take a moment to fill this out. Your feedback is very  important to [place] as you can help [place] better understand your patient needs as well as improve your experience and satisfaction. WE CARE ABOUT YOU!!!   Continue with aggressive therapeutic lifestyle changes which include diet and exercise Stop the use of the overhead fan and use nasal saline for nasal congestion and continue with Flonase Drink plenty of fluids this winter and stay well-hydrated and keep the house as cool as possible

## 2017-08-25 NOTE — Addendum Note (Signed)
Addended by: Zannie Cove on: 08/25/2017 09:54 AM   Modules accepted: Orders

## 2017-08-26 LAB — CBC WITH DIFFERENTIAL/PLATELET
BASOS ABS: 0 10*3/uL (ref 0.0–0.2)
Basos: 1 %
EOS (ABSOLUTE): 0.3 10*3/uL (ref 0.0–0.4)
Eos: 4 %
HEMOGLOBIN: 14.6 g/dL (ref 13.0–17.7)
Hematocrit: 44.2 % (ref 37.5–51.0)
IMMATURE GRANS (ABS): 0 10*3/uL (ref 0.0–0.1)
IMMATURE GRANULOCYTES: 0 %
LYMPHS ABS: 2.4 10*3/uL (ref 0.7–3.1)
LYMPHS: 38 %
MCH: 30.8 pg (ref 26.6–33.0)
MCHC: 33 g/dL (ref 31.5–35.7)
MCV: 93 fL (ref 79–97)
MONOCYTES: 5 %
Monocytes Absolute: 0.3 10*3/uL (ref 0.1–0.9)
NEUTROS PCT: 52 %
Neutrophils Absolute: 3.4 10*3/uL (ref 1.4–7.0)
Platelets: 212 10*3/uL (ref 150–379)
RBC: 4.74 x10E6/uL (ref 4.14–5.80)
RDW: 14 % (ref 12.3–15.4)
WBC: 6.5 10*3/uL (ref 3.4–10.8)

## 2017-08-26 LAB — LIPID PANEL
CHOLESTEROL TOTAL: 122 mg/dL (ref 100–199)
Chol/HDL Ratio: 2.7 ratio (ref 0.0–5.0)
HDL: 45 mg/dL (ref >39)
LDL CALC: 65 mg/dL (ref 0–99)
TRIGLYCERIDES: 59 mg/dL (ref 0–149)
VLDL Cholesterol Cal: 12 mg/dL (ref 5–40)

## 2017-08-26 LAB — BMP8+EGFR
BUN/Creatinine Ratio: 19 (ref 10–24)
BUN: 18 mg/dL (ref 8–27)
CALCIUM: 9 mg/dL (ref 8.6–10.2)
CHLORIDE: 104 mmol/L (ref 96–106)
CO2: 24 mmol/L (ref 20–29)
Creatinine, Ser: 0.97 mg/dL (ref 0.76–1.27)
GFR calc Af Amer: 91 mL/min/{1.73_m2} (ref >59)
GFR calc non Af Amer: 79 mL/min/{1.73_m2} (ref >59)
GLUCOSE: 102 mg/dL — AB (ref 65–99)
Potassium: 4 mmol/L (ref 3.5–5.2)
Sodium: 139 mmol/L (ref 134–144)

## 2017-08-26 LAB — HEPATIC FUNCTION PANEL
ALBUMIN: 4.2 g/dL (ref 3.5–4.8)
ALK PHOS: 71 IU/L (ref 39–117)
ALT: 19 IU/L (ref 0–44)
AST: 18 IU/L (ref 0–40)
BILIRUBIN, DIRECT: 0.2 mg/dL (ref 0.00–0.40)
Bilirubin Total: 0.8 mg/dL (ref 0.0–1.2)
TOTAL PROTEIN: 6.5 g/dL (ref 6.0–8.5)

## 2017-08-26 LAB — VITAMIN D 25 HYDROXY (VIT D DEFICIENCY, FRACTURES): Vit D, 25-Hydroxy: 53.8 ng/mL (ref 30.0–100.0)

## 2017-09-16 ENCOUNTER — Other Ambulatory Visit: Payer: Self-pay | Admitting: Family Medicine

## 2017-10-07 DIAGNOSIS — L570 Actinic keratosis: Secondary | ICD-10-CM | POA: Diagnosis not present

## 2017-10-07 DIAGNOSIS — D485 Neoplasm of uncertain behavior of skin: Secondary | ICD-10-CM | POA: Diagnosis not present

## 2017-10-07 DIAGNOSIS — Z85828 Personal history of other malignant neoplasm of skin: Secondary | ICD-10-CM | POA: Diagnosis not present

## 2017-10-07 DIAGNOSIS — D225 Melanocytic nevi of trunk: Secondary | ICD-10-CM | POA: Diagnosis not present

## 2017-11-20 ENCOUNTER — Other Ambulatory Visit: Payer: Self-pay | Admitting: Family Medicine

## 2018-02-14 ENCOUNTER — Other Ambulatory Visit: Payer: Self-pay | Admitting: Family Medicine

## 2018-02-17 ENCOUNTER — Other Ambulatory Visit: Payer: Self-pay | Admitting: Family Medicine

## 2018-02-18 ENCOUNTER — Other Ambulatory Visit: Payer: Self-pay | Admitting: Family Medicine

## 2018-02-21 ENCOUNTER — Other Ambulatory Visit: Payer: Self-pay | Admitting: Family Medicine

## 2018-02-22 NOTE — Telephone Encounter (Signed)
Ov 02/24/18

## 2018-02-24 ENCOUNTER — Encounter: Payer: Self-pay | Admitting: Family Medicine

## 2018-02-24 ENCOUNTER — Ambulatory Visit (INDEPENDENT_AMBULATORY_CARE_PROVIDER_SITE_OTHER): Payer: Medicare Other | Admitting: Family Medicine

## 2018-02-24 ENCOUNTER — Ambulatory Visit (INDEPENDENT_AMBULATORY_CARE_PROVIDER_SITE_OTHER): Payer: Medicare Other

## 2018-02-24 VITALS — BP 125/77 | HR 69 | Temp 97.1°F | Ht 70.0 in | Wt 198.0 lb

## 2018-02-24 DIAGNOSIS — D692 Other nonthrombocytopenic purpura: Secondary | ICD-10-CM | POA: Diagnosis not present

## 2018-02-24 DIAGNOSIS — Z8249 Family history of ischemic heart disease and other diseases of the circulatory system: Secondary | ICD-10-CM | POA: Diagnosis not present

## 2018-02-24 DIAGNOSIS — Z800 Family history of malignant neoplasm of digestive organs: Secondary | ICD-10-CM

## 2018-02-24 DIAGNOSIS — E559 Vitamin D deficiency, unspecified: Secondary | ICD-10-CM

## 2018-02-24 DIAGNOSIS — N400 Enlarged prostate without lower urinary tract symptoms: Secondary | ICD-10-CM | POA: Diagnosis not present

## 2018-02-24 DIAGNOSIS — Z0000 Encounter for general adult medical examination without abnormal findings: Secondary | ICD-10-CM | POA: Diagnosis not present

## 2018-02-24 DIAGNOSIS — E7800 Pure hypercholesterolemia, unspecified: Secondary | ICD-10-CM

## 2018-02-24 LAB — URINALYSIS, COMPLETE
Bilirubin, UA: NEGATIVE
GLUCOSE, UA: NEGATIVE
Leukocytes, UA: NEGATIVE
Nitrite, UA: NEGATIVE
UUROB: 0.2 mg/dL (ref 0.2–1.0)
pH, UA: 5 (ref 5.0–7.5)

## 2018-02-24 LAB — MICROSCOPIC EXAMINATION
BACTERIA UA: NONE SEEN
Epithelial Cells (non renal): NONE SEEN /hpf (ref 0–10)
RBC, UA: NONE SEEN /hpf (ref 0–2)
Renal Epithel, UA: NONE SEEN /hpf
WBC UA: NONE SEEN /HPF (ref 0–5)

## 2018-02-24 NOTE — Patient Instructions (Addendum)
Medicare Annual Wellness Visit  [facility] and the medical providers at [facility] strive to bring you the best medical care.  In doing so we not only want to address your current medical conditions and concerns but also to detect new conditions early and prevent illness, disease and health-related problems.    Medicare offers a yearly Wellness Visit which allows our clinical staff to assess your need for preventative services including immunizations, lifestyle education, counseling to decrease risk of preventable diseases and screening for fall risk and other medical concerns.    This visit is provided free of charge (no copay) for all Medicare recipients. The clinical pharmacists at [facility] have begun to conduct these Wellness Visits which will also include a thorough review of all your medications.    As you primary medical provider recommend that you make an appointment for your Annual Wellness Visit if you have not done so already this year.  You may set up this appointment before you leave today or you may call back ([phone]) and schedule an appointment.  Please make sure when you call that you mention that you are scheduling your Annual Wellness Visit with the clinical pharmacist so that the appointment may be made for the proper length of time.     Continue current medications. Continue good therapeutic lifestyle changes which include good diet and exercise. Fall precautions discussed with patient. If an FOBT was given today- please return it to our front desk. If you are over 18 years old - you may need Prevnar 5 or the adult Pneumonia vaccine.  **Flu shots are available--- please call and schedule a FLU-CLINIC appointment**  After your visit with [place] today you will receive a survey in the mail or online from [employer] regarding your care with [place]. Please take a moment to fill this out. Your feedback is very  important to [place] as you can help [place] better understand your patient needs as well as improve your experience and satisfaction. WE CARE ABOUT YOU!!!   Continue getting eye exams regularly Do not forget to get your next colonoscopy in the spring 2021 Return the FOBT We will call with chest x-ray results and lab results as soon as those results become available Reduce the use of sedating antihistamines like Zyrtec and Benadryl and try Claritin or Allegra instead Avoid allergen exposure as much as possible, wear mask and use nasal saline

## 2018-02-24 NOTE — Progress Notes (Signed)
Subjective:    Patient ID: Samuel Wheeler, male    DOB: 03/11/1946, 72 y.o.   MRN: [id number]  HPI Pt here for follow up and management of chronic medical problems which includes hyperlipidemia. He is taking medication regularly.  The patient appears to be doing well overall.  He has no complaints today and is up-to-date on all of his prescriptions.  He is due for a physical exam today and will be given an FOBT to return in June.  He is also due to get a chest x-ray and he will have lab work done and a urine specimen.  Stroke in both parents skin cancers and colon cancer and one sister.  The patient is feeling great and his only problem is that he has had skin cancer in the past himself.  He does get his colonoscopies regularly and is not due again until 2021.  He denies any chest pain pressure tightness or palpitation.  He denies any trouble with shortness of breath.  He is active physically and has no complaints with his health.  He denies any trouble with swallowing heartburn indigestion nausea vomiting diarrhea blood in the stool or black tarry bowel movements.  He is passing his water without problems.  His wife has had lung cancer and liver cancer and he is not sexually active and he is okay with that.  He gets his eye exams done yearly at the [facility] store here in [place].   Patient Active Problem List   Diagnosis Date Noted  . Benign positional vertigo 08/25/2017  . Dizziness 03/04/2017  . PVC's (premature ventricular contractions) 11/01/2014  . 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 HLD (hyperlipidemia) 01/27/2013  . BPH (benign prostatic hyperplasia) 01/27/2013  . Erectile dysfunction 01/27/2013  . Genital herpes simplex 01/27/2013  . Actinic keratosis 01/27/2013  . Need for zoster vaccination 01/27/2013   Outpatient Encounter Medications as of 02/24/2018  Medication Sig  . albuterol (PROAIR HFA) 108 (90 Base) MCG/ACT inhaler USE 2 INHALATIONS EVERY 6 HOURS AS NEEDED FOR WHEEZING OR  SHORTNESS OF BREATH  . aspirin 81 MG chewable tablet Chew 81 mg by mouth daily.  Marland Kitchen atorvastatin (LIPITOR) 20 MG tablet TAKE 1 TABLET DAILY  . cetirizine (ZYRTEC) 10 MG tablet TAKE 1 TABLET DAILY  . doxazosin (CARDURA) 2 MG tablet Take 1 tablet (2 mg total) by mouth at bedtime.  . finasteride (PROSCAR) 5 MG tablet TAKE 1 TABLET DAILY  . fluticasone (FLONASE) 50 MCG/ACT nasal spray Place 2 sprays into both nostrils daily.  Marland Kitchen NIASPAN 1000 MG CR tablet TAKE 1 TABLET AT BEDTIME  . valACYclovir (VALTREX) 1000 MG tablet TAKE 1 TABLET DAILY  . Vitamin D, Ergocalciferol, (DRISDOL) 50000 units CAPS capsule TAKE 1 CAPSULE EVERY 7 DAYS  . DimenhyDRINATE (DRAMAMINE PO) Take 1 Dose by mouth as needed.    No facility-administered encounter medications on file as of 02/24/2018.       Review of Systems  Constitutional: Negative.   HENT: Negative.   Eyes: Negative.   Respiratory: Negative.   Cardiovascular: Negative.   Gastrointestinal: Negative.   Endocrine: Negative.   Genitourinary: Negative.   Musculoskeletal: Negative.   Skin: Negative.   Allergic/Immunologic: Negative.   Neurological: Negative.   Hematological: Negative.   Psychiatric/Behavioral: Negative.        Objective:   Physical Exam  Constitutional: He is oriented to person, place, and time. He appears well-developed and well-nourished. No distress.  The patient is pleasant and relaxed and  in good spirits today.  He is feeling well and has lost some weight through his dieting habits.  HENT:  Head: Normocephalic and atraumatic.  Right Ear: External ear normal.  Left Ear: External ear normal.  Mouth/Throat: Oropharynx is clear and moist. No oropharyngeal exudate.  Nasal turbinate congestion bilaterally  Eyes: Pupils are equal, round, and reactive to light. Conjunctivae and EOM are normal. Right eye exhibits no discharge. Left eye exhibits no discharge.  Neck: Normal range of motion. Neck supple. No thyromegaly present.  No  bruits thyromegaly or anterior cervical adenopathy  Cardiovascular: Normal rate, regular rhythm, normal heart sounds and intact distal pulses.  No murmur heard. Heart is regular at 60/min without murmurs  Pulmonary/Chest: Effort normal and breath sounds normal. No respiratory distress. He has no wheezes. He has no rales. He exhibits no tenderness.  Clear anteriorly and posteriorly and no axillary adenopathy.  The chest wall especially at the clavicle area is somewhat asymmetric with the right being more prominent than the left.  I believe this is just a benign problem.  Abdominal: Soft. Bowel sounds are normal. He exhibits no mass. There is no tenderness. No hernia.  No abdominal tenderness masses organ enlargement or bruits and no inguinal adenopathy  Genitourinary: Rectum normal and penis normal.  Genitourinary Comments: The prostate gland was slightly enlarged without lumps or masses.  There were no rectal masses.  The external genitalia were within normal limits with normal testicles without masses and no inguinal hernias being palpated.  Musculoskeletal: Normal range of motion. He exhibits no edema or deformity.  Lymphadenopathy:    He has no cervical adenopathy.  Neurological: He is alert and oriented to person, place, and time. He has normal reflexes. No cranial nerve deficit.  Skin: Skin is warm and dry. No rash noted.  Senile purpura on both arms.  Psychiatric: He has a normal mood and affect. His behavior is normal. Judgment and thought content normal.  Patient has normal mood affect and behavior  Nursing note and vitals reviewed.  BP 125/77 (BP Location: Right Arm)   Pulse 69   Temp (!) 97.1 F (36.2 C) (Oral)   Ht _0  (1.778 m)   Wt 198 lb (89.8 kg)   BMI 28.41 kg/m         Assessment & Plan:  1. Vitamin D deficiency -10 you with current treatment pending results of lab work - CBC with Differential/Platelet - VITAMIN D 25 Hydroxy (Vit-D Deficiency, Fractures)  2.  Pure hypercholesterolemia -Continue with current treatment pending results of lab work - BMP8+EGFR - CBC with Differential/Platelet - Lipid panel - Hepatic function panel - DG Chest 2 View; Future  3. Family history of heart disease -The patient's family history is also positive for stroke.  He will continue with his coated baby aspirin once daily after eating. - CBC with Differential/Platelet - Lipid panel - DG Chest 2 View; Future  4. Benign prostatic hyperplasia, unspecified whether lower urinary tract symptoms present -No complaints with voiding and we will check his PSA today. - CBC with Differential/Platelet - PSA, total and free - Urinalysis, Complete  5. Family history of colon cancer -Next colonoscopy is due in 2021 and patient is aware of this. - CBC with Differential/Platelet  6. Health care maintenance - Thyroid Panel With TSH  7.  Senile purpura -Some bruising was noted on both arms.  Patient Instructions  Medicare Annual Wellness Visit  [facility] and the medical providers at [facility] strive to bring you the best medical care.  In doing so we not only want to address your current medical conditions and concerns but also to detect new conditions early and prevent illness, disease and health-related problems.    Medicare offers a yearly Wellness Visit which allows our clinical staff to assess your need for preventative services including immunizations, lifestyle education, counseling to decrease risk of preventable diseases and screening for fall risk and other medical concerns.    This visit is provided free of charge (no copay) for all Medicare recipients. The clinical pharmacists at [facility] have begun to conduct these Wellness Visits which will also include a thorough review of all your medications.    As you primary medical provider recommend that you make an appointment for your Annual  Wellness Visit if you have not done so already this year.  You may set up this appointment before you leave today or you may call back ([phone]) and schedule an appointment.  Please make sure when you call that you mention that you are scheduling your Annual Wellness Visit with the clinical pharmacist so that the appointment may be made for the proper length of time.     Continue current medications. Continue good therapeutic lifestyle changes which include good diet and exercise. Fall precautions discussed with patient. If an FOBT was given today- please return it to our front desk. If you are over 67 years old - you may need Prevnar 16 or the adult Pneumonia vaccine.  **Flu shots are available--- please call and schedule a FLU-CLINIC appointment**  After your visit with [place] today you will receive a survey in the mail or online from [employer] regarding your care with [place]. Please take a moment to fill this out. Your feedback is very important to [place] as you can help [place] better understand your patient needs as well as improve your experience and satisfaction. WE CARE ABOUT YOU!!!   Continue getting eye exams regularly Do not forget to get your next colonoscopy in the spring 2021 Return the FOBT We will call with chest x-ray results and lab results as soon as those results become available Reduce the use of sedating antihistamines like Zyrtec and Benadryl and try Claritin or Allegra instead Avoid allergen exposure as much as possible, wear mask and use nasal saline  Arrie Senate MD

## 2018-02-25 LAB — CBC WITH DIFFERENTIAL/PLATELET
BASOS ABS: 0 10*3/uL (ref 0.0–0.2)
BASOS: 0 %
EOS (ABSOLUTE): 0.2 10*3/uL (ref 0.0–0.4)
Eos: 3 %
Hematocrit: 43.7 % (ref 37.5–51.0)
Hemoglobin: 15 g/dL (ref 13.0–17.7)
IMMATURE GRANULOCYTES: 0 %
Immature Grans (Abs): 0 10*3/uL (ref 0.0–0.1)
Lymphocytes Absolute: 2.4 10*3/uL (ref 0.7–3.1)
Lymphs: 36 %
MCH: 30.7 pg (ref 26.6–33.0)
MCHC: 34.3 g/dL (ref 31.5–35.7)
MCV: 90 fL (ref 79–97)
MONOS ABS: 0.5 10*3/uL (ref 0.1–0.9)
Monocytes: 7 %
Neutrophils Absolute: 3.6 10*3/uL (ref 1.4–7.0)
Neutrophils: 54 %
PLATELETS: 217 10*3/uL (ref 150–450)
RBC: 4.88 x10E6/uL (ref 4.14–5.80)
RDW: 13.8 % (ref 12.3–15.4)
WBC: 6.8 10*3/uL (ref 3.4–10.8)

## 2018-02-25 LAB — THYROID PANEL WITH TSH
Free Thyroxine Index: 1.9 (ref 1.2–4.9)
T3 Uptake Ratio: 30 % (ref 24–39)
T4 TOTAL: 6.4 ug/dL (ref 4.5–12.0)
TSH: 3.9 u[IU]/mL (ref 0.450–4.500)

## 2018-02-25 LAB — PSA, TOTAL AND FREE
PROSTATE SPECIFIC AG, SERUM: 0.8 ng/mL (ref 0.0–4.0)
PSA, Free Pct: 20 %
PSA, Free: 0.16 ng/mL

## 2018-02-25 LAB — BMP8+EGFR
BUN/Creatinine Ratio: 21 (ref 10–24)
BUN: 21 mg/dL (ref 8–27)
CO2: 23 mmol/L (ref 20–29)
Calcium: 9.2 mg/dL (ref 8.6–10.2)
Chloride: 107 mmol/L — ABNORMAL HIGH (ref 96–106)
Creatinine, Ser: 1.02 mg/dL (ref 0.76–1.27)
GFR, EST AFRICAN AMERICAN: 85 mL/min/{1.73_m2} (ref >59)
GFR, EST NON AFRICAN AMERICAN: 74 mL/min/{1.73_m2} (ref >59)
Glucose: 106 mg/dL — ABNORMAL HIGH (ref 65–99)
POTASSIUM: 4.2 mmol/L (ref 3.5–5.2)
SODIUM: 146 mmol/L — AB (ref 134–144)

## 2018-02-25 LAB — HEPATIC FUNCTION PANEL
ALT: 19 IU/L (ref 0–44)
AST: 18 IU/L (ref 0–40)
Albumin: 4.2 g/dL (ref 3.5–4.8)
Alkaline Phosphatase: 72 IU/L (ref 39–117)
Bilirubin Total: 0.9 mg/dL (ref 0.0–1.2)
Bilirubin, Direct: 0.24 mg/dL (ref 0.00–0.40)
Total Protein: 6.8 g/dL (ref 6.0–8.5)

## 2018-02-25 LAB — LIPID PANEL
Chol/HDL Ratio: 2.6 ratio (ref 0.0–5.0)
Cholesterol, Total: 138 mg/dL (ref 100–199)
HDL: 53 mg/dL (ref >39)
LDL Calculated: 74 mg/dL (ref 0–99)
Triglycerides: 53 mg/dL (ref 0–149)
VLDL Cholesterol Cal: 11 mg/dL (ref 5–40)

## 2018-02-25 LAB — VITAMIN D 25 HYDROXY (VIT D DEFICIENCY, FRACTURES): VIT D 25 HYDROXY: 49.5 ng/mL (ref 30.0–100.0)

## 2018-03-10 ENCOUNTER — Ambulatory Visit (INDEPENDENT_AMBULATORY_CARE_PROVIDER_SITE_OTHER): Payer: Medicare Other | Admitting: Family Medicine

## 2018-03-10 ENCOUNTER — Encounter: Payer: Self-pay | Admitting: Family Medicine

## 2018-03-10 VITALS — BP 128/74 | HR 65 | Temp 97.0°F | Ht 70.0 in | Wt 198.0 lb

## 2018-03-10 DIAGNOSIS — W57XXXA Bitten or stung by nonvenomous insect and other nonvenomous arthropods, initial encounter: Secondary | ICD-10-CM

## 2018-03-10 DIAGNOSIS — Z1211 Encounter for screening for malignant neoplasm of colon: Secondary | ICD-10-CM

## 2018-03-10 DIAGNOSIS — S40262A Insect bite (nonvenomous) of left shoulder, initial encounter: Secondary | ICD-10-CM | POA: Diagnosis not present

## 2018-03-10 MED ORDER — DOXYCYCLINE HYCLATE 100 MG PO TABS: 100.0000 mg | ORAL_TABLET | Freq: Two times a day (BID) | ORAL | 0 refills | Status: AC

## 2018-03-10 NOTE — Addendum Note (Signed)
Addended by: Zannie Cove on: 03/10/2018 10:39 AM   Modules accepted: Orders

## 2018-03-10 NOTE — Progress Notes (Signed)
Subjective: CC: achy PCP: Chipper Herb, MD Samuel Wheeler is a 72 y.o. male presenting to clinic today for:  1. Tick bite Patient reports generalized arthralgias, fatigue that has been progressively getting worse over the last couple of days.  He reports multiple tick bites that he sustained over the last month, several of them having been attached for 2 or more days.  Most recently he had one under the left axilla that is slightly red but nonpurulent.  Denies any fevers, chills, nuchal rigidity, chest pain shortness of breath, bull's-eye rash or spotted rash.  No nausea, vomiting, abdominal pain.  He notes that symptoms feel very similar to when he was diagnosed with [facility] spotted fever several years ago.   ROS: Per HPI  Allergies  Allergen Reactions  . Poison Ivy Extract    Past Medical History:  Diagnosis Date  . Actinic keratosis   . Allergy    Year Round   . Arthritis   . BPH (benign prostatic hyperplasia)   . Cancer ([place])    skin cancer - removed several times  . Cataract 2015   Right Eye   . ED (erectile dysfunction)   . Hyperlipidemia   . Hypertension    pt denies    Current Outpatient Medications:  .  albuterol (PROAIR HFA) 108 (90 Base) MCG/ACT inhaler, USE 2 INHALATIONS EVERY 6 HOURS AS NEEDED FOR WHEEZING OR SHORTNESS OF BREATH, Disp: 51 g, Rfl: 3 .  aspirin 81 MG chewable tablet, Chew 81 mg by mouth daily., Disp: , Rfl:  .  atorvastatin (LIPITOR) 20 MG tablet, TAKE 1 TABLET DAILY, Disp: 90 tablet, Rfl: 0 .  cetirizine (ZYRTEC) 10 MG tablet, TAKE 1 TABLET DAILY, Disp: 90 tablet, Rfl: 0 .  DimenhyDRINATE (DRAMAMINE PO), Take 1 Dose by mouth as needed. , Disp: , Rfl:  .  doxazosin (CARDURA) 2 MG tablet, Take 1 tablet (2 mg total) by mouth at bedtime., Disp: 90 tablet, Rfl: 3 .  finasteride (PROSCAR) 5 MG tablet, TAKE 1 TABLET DAILY, Disp: 90 tablet, Rfl: 0 .  fluticasone (FLONASE) 50 MCG/ACT nasal spray, Place 2 sprays into both nostrils daily.,  Disp: 48 g, Rfl: 3 .  NIASPAN 1000 MG CR tablet, TAKE 1 TABLET AT BEDTIME, Disp: 90 tablet, Rfl: 1 .  valACYclovir (VALTREX) 1000 MG tablet, TAKE 1 TABLET DAILY, Disp: 90 tablet, Rfl: 0 .  Vitamin D, Ergocalciferol, (DRISDOL) 50000 units CAPS capsule, TAKE 1 CAPSULE EVERY 7 DAYS, Disp: 12 capsule, Rfl: 0 Social History   Socioeconomic History  . Marital status: Married    Spouse name: [name]  . Number of children: 6  . Years of education: Some college  . Highest education level: Not on file  Occupational History  . 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 Financial resource strain: Not on file  . Food insecurity:    Worry: Not on file    Inability: Not on file  . Transportation needs:    Medical: Not on file    Non-medical: Not on file  Tobacco Use  . Smoking status: Never Smoker  . Smokeless tobacco: Never Used  Substance and Sexual Activity  . Alcohol use: No  . Drug use: No  . Sexual activity: Yes    Birth control/protection: None  Lifestyle  . Physical activity:    Days per week: Not on file    Minutes per session: Not on file  .  Stress: Not on file  Relationships  . Social connections:    Talks on phone: Not on file    Gets together: Not on file    Attends religious service: Not on file    Active member of club or organization: Not on file    Attends meetings of clubs or organizations: Not on file    Relationship status: Not on file  . Intimate partner violence:    Fear of current or ex partner: Not on file    Emotionally abused: Not on file    Physically abused: Not on file    Forced sexual activity: Not on file  Other Topics Concern  . Not on file  Social History Narrative   Lives with wife.     His sister in law just moved in.     He has six step children.    Caffeine use: 2 cups coffee per day    Right handed    Family History  Problem Relation Age of Onset  . Stroke Mother 38  . Stroke Father 49  .  Diabetes Father   . COPD Father   . Heart disease Sister        "Mild heart attack", pacemaker  . Stroke Brother 99  . Syncope episode Brother   . Parkinson's disease Sister   . Cancer Sister 30       Colon Cancer  . Cancer Sister        Skin Cancers  . Cancer Sister        Melanoma  . Cancer Sister        Skin Cancer  . Cancer Maternal Aunt        bone    Objective: Office vital signs reviewed. BP 128/74   Pulse 65   Temp (!) 97 F (36.1 C) (Oral)   Ht 5\' 10"  (1.778 m)   Wt 198 lb (89.8 kg)   BMI 28.41 kg/m   Physical Examination:  General: Awake, alert, well nourished, well appearing, No acute distress HEENT: Normal    Neck: No nuchal rigidity    Eyes: PERRLA, extraocular membranes intact, sclera white    Throat: moist mucus membranes Cardio: regular rate and rhythm, S1S2 heard, no murmurs appreciated Pulm: clear to auscultation bilaterally, no wheezes, rhonchi or rales; normal work of breathing on room air Extremities: warm, well perfused, No edema, cyanosis or clubbing; +2 pulses bilaterally MSK: normal gait and normal station; no appreciable joint swelling or erythema. Skin: dry; very small and minimally raised blanching, erythematous wheal appreciated along the left axilla.  There is appreciable punctum.  No drainage.  No target lesions.  Assessment/ Plan: 72 y.o. male   1. Tick bite, initial encounter Given constellation of symptoms, will empirically treat with doxycycline p.o. twice daily for the next 14 days.  Labs for [facility] spotted fever and Lyme likely not very useful at this point given acute onset of symptoms.  Additionally, I reviewed his labs from 2015 which did show a positive IgG for RMSF.  Home care instructions were reviewed.  Reasons for return and emergent evaluation the emergency department discussed.  Patient to follow-up as needed.    Meds ordered this encounter  Medications  . doxycycline (VIBRA-TABS) 100 MG tablet    Sig: Take 1  tablet (100 mg total) by mouth 2 (two) times daily for 14 days.    Dispense:  28 tablet    Refill:  0     Janora Norlander, DO  [facility] [phone]

## 2018-03-10 NOTE — Patient Instructions (Signed)
[place] Spotted Fever [place] spotted fever is an illness that is spread to people by infected ticks. The illness causes flulike symptoms and a reddish-purple rash. This illness can quickly become very serious. Treatment must be started right away. When the illness is not treated right away, it can sometimes lead to long-term health problems or even death. This illness is most common during warm weather when ticks are most active. What are the causes? [place] spotted fever is caused by a type of bacteria that is called Rickettsia rickettsii. This type of bacteria is carried by American dog ticks and [place] wood ticks. People get infected through a bite from a tick that is infected with the bacteria. The bite is painless, and it frequently goes unnoticed. The bacteria can also infect a person when tick blood or tick feces get into a person's body through damaged skin. A tick bite is not necessary for an infection to occur. People can get [place] spotted fever if they get a tick's blood or body fluids on their skin in the area of a small cut or sore. This could happen while removing a tick from another person or a dog. The infection is not contagious, and it cannot be spread (transmitted) from person to person. What are the signs or symptoms? Symptoms may begin 2-14 days after a tick bite. The most common early symptoms are:  Fever.  Muscle aches.  Headache.  Nausea.  Vomiting.  Poor appetite.  Abdominal pain.  The reddish-purple rash usually appears 3-5 days after the first symptoms begin. The rash often starts on the wrists and ankles. It may then spread to the palms, the soles of the feet, the legs, and the trunk. How is this diagnosed? Diagnosis is based on a physical exam, medical history, and blood tests. Your health care provider may suspect [place] spotted fever in one of these cases:  If you have recently been bitten by a tick.  If you  have been in areas that have a lot of ticks or in areas where the disease is common.  How is this treated? It is important to begin treatment right away. Treatment will usually involve the use of antibiotic medicines. In some cases, your health care provider may begin treatment before the diagnosis is confirmed. If your symptoms are severe, a hospital stay may be needed. Follow these instructions at home:  Rest as much as possible until you feel better.  Take medicines only as directed by your health care provider.  Take your antibiotic medicine as directed by your health care provider. Finish the antibiotic even if you start to feel better.  Drink enough fluid to keep your urine clear or pale yellow.  Keep all follow-up visits as directed by your health care provider. This is important. How is this prevented? Avoiding tick bites can help to prevent this illness. Take these steps to avoid tick bites when you are outdoors:  Be aware that most ticks live in shrubs, low tree branches, and grassy areas. A tick can climb onto your body when you make contact with leaves or grass where the tick is waiting.  Wear protective clothing. Long sleeves and long pants are best.  Wear white clothes so you can see ticks more easily.  Tuck your pant legs into your socks.  If you go walking on a trail, stay in the middle of the trail to avoid brushing against bushes.  Avoid walking through areas that have long   grass.  Put insect repellent on all exposed skin and along boot tops, pant legs, and sleeve cuffs.  Check clothing, hair, and skin repeatedly and before going inside.  Check family members and pets for ticks.  Brush off any ticks that are not attached.  Take a shower or a bath as soon as possible after you have been outdoors. Check your skin for ticks. The most common places on the body where ticks attach themselves are the scalp, neck, armpits, waist, and groin.  You can also greatly  reduce your chances of getting [facility] spotted fever if you remove attached ticks as soon as possible. To remove an attached tick, use a forceps or fine-point tweezers to detach the intact tick without leaving its mouth parts in the skin. The wound from the tick bite should be washed after the tick has been removed. Contact a health care provider if:  You have drainage, swelling, or increased redness or pain in the area of the rash. Get help right away if:  You have chest pain.  You have shortness of breath.  You have a severe headache.  You have a seizure.  You have severe abdominal pain.  You are feeling confused.  You are bruising easily.  You have bleeding from your gums.  You have blood in your stool. This information is not intended to replace advice given to you by your health care provider. Make sure you discuss any questions you have with your health care provider. Document Released: 01/04/2001 Document Revised: 02/28/2016 Document Reviewed: 05/08/2014 Elsevier Interactive Patient Education  2018 [employer]. Lyme Disease Lyme disease is an infection that affects many parts of the body, including the skin, joints, and nervous system. It is a bacterial infection that starts from the bite of an infected tick. The infection can spread, and some of the symptoms are similar to the flu. If Lyme disease is not treated, it may cause joint pain, swelling, numbness, problems thinking, fatigue, muscle weakness, and other problems. What are the causes? This condition is caused by bacteria called Borrelia burgdorferi. You can get Lyme disease by being bitten by an infected tick. The tick must be attached to your skin to pass along the infection. Deer often carry infected ticks. What increases the risk? The following factors may make you more likely to develop this condition:  Living in or visiting these areas in the [place] ? [place]. ? [place]. ? [place].  Spending time in wooded or grassy areas.  Being outdoors with exposed skin.  Camping, gardening, hiking, fishing, or hunting outdoors.  Failing to remove a tick from your skin within 3-4 days.  What are the signs or symptoms? Symptoms of this condition include:  A round, red rash that surrounds the center of the tick bite. This is the first sign of infection. The center of the rash may be blood colored or have tiny blisters.  Fatigue.  Headache.  Chills and fever.  General achiness.  Joint pain, often in the knees.  Muscle pain.  Swollen lymph glands.  Stiff neck.  How is this diagnosed? This condition is diagnosed based on:  Your symptoms and medical history.  A physical exam.  A blood test.  How is this treated? The main treatment for this condition is antibiotic medicine, which is usually taken by mouth (orally). The length of treatment depends on how soon after a tick bite you begin taking the medicine. In some cases, treatment is  necessary for several weeks. If the infection is severe, antibiotics may need to be given through an IV tube that is inserted into one of your veins. Follow these instructions at home:  Take your antibiotic medicine as told by your health care provider. Do not stop taking the antibiotic even if you start to feel better.  Ask your health care provider about takinga probiotic in between doses of your antibiotic to help avoid stomach upset or diarrhea.  Check with your health care provider before supplementing your treatment. Many alternative therapies have not been proven and may be harmful to you.  Keep all follow-up visits as told by your health care provider. This is important. How is this prevented? You can become reinfected if you get another tick bite from an infected tick. Take these steps to help prevent an infection:  Cover your skin with light-colored clothing when you are outdoors in the spring and summer  months.  Spray clothing and skin with bug spray. The spray should be 20-30% DEET.  Avoid wooded, grassy, and shaded areas.  Remove yard litter, brush, trash, and plants that attract deer and rodents.  Check yourself for ticks when you come indoors.  Wash clothing worn each day.  Check your pets for ticks before they come inside.  If you find a tick: ? Remove it with tweezers. ? Clean your hands and the bite area with rubbing alcohol or soap and water.  Pregnant women should take special care to avoid tick bites because the infection can be passed along to the fetus. Contact a health care provider if:  You have symptoms after treatment.  You have removed a tick and want to bring it to your health care provider for testing. Get help right away if:  You have an irregular heartbeat.  You have nerve pain.  Your face feels numb. This information is not intended to replace advice given to you by your health care provider. Make sure you discuss any questions you have with your health care provider. Document Released: 12/29/2000 Document Revised: 05/13/2016 Document Reviewed: 05/13/2016 Elsevier Interactive Patient Education  2018 [employer].

## 2018-03-13 LAB — FECAL OCCULT BLOOD, IMMUNOCHEMICAL: FECAL OCCULT BLD: NEGATIVE

## 2018-04-05 ENCOUNTER — Other Ambulatory Visit: Payer: Self-pay | Admitting: Family Medicine

## 2018-04-10 ENCOUNTER — Other Ambulatory Visit: Payer: Self-pay | Admitting: Family Medicine

## 2018-04-14 ENCOUNTER — Ambulatory Visit (INDEPENDENT_AMBULATORY_CARE_PROVIDER_SITE_OTHER): Payer: Medicare Other | Admitting: Pediatrics

## 2018-04-14 ENCOUNTER — Encounter: Payer: Self-pay | Admitting: Pediatrics

## 2018-04-14 VITALS — BP 147/77 | HR 59 | Temp 97.3°F | Ht 70.0 in | Wt 200.0 lb

## 2018-04-14 DIAGNOSIS — H8110 Benign paroxysmal vertigo, unspecified ear: Secondary | ICD-10-CM

## 2018-04-14 MED ORDER — MECLIZINE HCL 25 MG PO CHEW: 12.5000 mg | CHEWABLE_TABLET | Freq: Three times a day (TID) | ORAL | 1 refills | Status: DC | PRN

## 2018-04-14 NOTE — Progress Notes (Signed)
  Subjective:   Patient ID: Samuel Wheeler, male    DOB: [birth year], 72 y.o.   MRN: [id number] CC: Dizziness (For 1 week) and Nausea  HPI: Samuel Wheeler is a 72 y.o. male   For the last few days he has had URI symptoms including more runny nose, pressure in his ears and sinuses.  No fevers.  Appetite is been fine.  When he rolled over in bed this morning he felt like he was going to keep rolling.  Sometimes when he stands up he feels like the room spins and he gets a wave of nausea.  He is been taking over-the-counter motion sickness medicine with some improvement.  He has had similar episodes in the past, was seen by ENT and neurology.  Diagnosed with BPPV.  No numbness or weakness on one side of his body or the other.  No trouble walking.  No trouble with speech.  Relevant past medical, surgical, family and social history reviewed. Allergies and medications reviewed and updated. Social History   Tobacco Use  Smoking Status Never Smoker  Smokeless Tobacco Never Used   ROS: Per HPI   Objective:    BP (!) 147/77   Pulse (!) 59   Temp (!) 97.3 F (36.3 C) (Oral)   Ht 5\' 10"  (1.778 m)   Wt 200 lb (90.7 kg)   BMI 28.70 kg/m   Wt Readings from Last 3 Encounters:  04/14/18 200 lb (90.7 kg)  03/10/18 198 lb (89.8 kg)  02/24/18 198 lb (89.8 kg)    Gen: NAD, alert, cooperative with exam, NCAT EYES: EOMI, no conjunctival injection, or no icterus, several beats of nystagmus with leftward gaze, a couple beats with rightward gaze. ENT: Right TM with white effusion, left TM dull.  OP with mild erythema LYMPH: no cervical LAD CV: NRRR, normal S1/S2, no murmur, distal pulses 2+ b/l Resp: CTABL, no wheezes, normal WOB Abd: +BS, soft, NTND. no guarding or organomegaly Ext: No edema, warm Neuro: Alert and oriented, strength equal b/l UE and LE, coordination grossly normal, CN III-XII intact MSK: normal muscle bulk  Assessment & Plan:  Arshan was seen today for dizziness and  nausea.  Diagnoses and all orders for this visit:  Benign paroxysmal positional vertigo, unspecified laterality Continue Flonase, antihistamine.  Try Epley maneuver at home.  Meclizine if needed.  Will follow-up with me regarding any ongoing symptoms in 2 days. -     Meclizine HCl 25 MG CHEW; Chew 0.5-1 tablets (12.5-25 mg total) by mouth 3 (three) times daily as needed.   Follow up plan: Return if symptoms worsen or fail to improve. Assunta Found, MD [place]

## 2018-04-14 NOTE — Patient Instructions (Signed)

## 2018-04-19 ENCOUNTER — Telehealth: Payer: Self-pay | Admitting: *Deleted

## 2018-04-19 MED ORDER — MECLIZINE HCL 25 MG PO TABS: 25.0000 mg | ORAL_TABLET | Freq: Three times a day (TID) | ORAL | 0 refills | Status: DC | PRN

## 2018-04-19 NOTE — Telephone Encounter (Signed)
Fax received [facility] [place] Meclizine 25 mg chewable Not able to order chewable Please send new Rx for tablets

## 2018-04-24 ENCOUNTER — Other Ambulatory Visit: Payer: Self-pay | Admitting: Family Medicine

## 2018-04-29 ENCOUNTER — Other Ambulatory Visit: Payer: Self-pay | Admitting: Family Medicine

## 2018-05-09 ENCOUNTER — Other Ambulatory Visit: Payer: Self-pay | Admitting: Family Medicine

## 2018-05-10 NOTE — Telephone Encounter (Signed)
Last Vit D 02/24/18  49.5  DWM

## 2018-05-19 ENCOUNTER — Other Ambulatory Visit: Payer: Self-pay | Admitting: Family Medicine

## 2018-05-23 ENCOUNTER — Other Ambulatory Visit: Payer: Self-pay | Admitting: Family Medicine

## 2018-06-03 ENCOUNTER — Other Ambulatory Visit: Payer: Self-pay | Admitting: Family Medicine

## 2018-06-30 ENCOUNTER — Ambulatory Visit (INDEPENDENT_AMBULATORY_CARE_PROVIDER_SITE_OTHER): Payer: Medicare Other

## 2018-06-30 DIAGNOSIS — Z23 Encounter for immunization: Secondary | ICD-10-CM

## 2018-07-21 DIAGNOSIS — H2513 Age-related nuclear cataract, bilateral: Secondary | ICD-10-CM | POA: Diagnosis not present

## 2018-07-21 DIAGNOSIS — H40033 Anatomical narrow angle, bilateral: Secondary | ICD-10-CM | POA: Diagnosis not present

## 2018-07-28 ENCOUNTER — Other Ambulatory Visit: Payer: Self-pay | Admitting: Family Medicine

## 2018-09-06 ENCOUNTER — Ambulatory Visit: Payer: Medicare Other | Admitting: Family Medicine

## 2018-09-20 ENCOUNTER — Ambulatory Visit (INDEPENDENT_AMBULATORY_CARE_PROVIDER_SITE_OTHER): Payer: Medicare Other | Admitting: Family Medicine

## 2018-09-20 ENCOUNTER — Encounter: Payer: Self-pay | Admitting: Family Medicine

## 2018-09-20 VITALS — BP 125/80 | HR 62 | Temp 98.0°F | Ht 70.0 in | Wt 204.0 lb

## 2018-09-20 DIAGNOSIS — Z8249 Family history of ischemic heart disease and other diseases of the circulatory system: Secondary | ICD-10-CM

## 2018-09-20 DIAGNOSIS — Z800 Family history of malignant neoplasm of digestive organs: Secondary | ICD-10-CM | POA: Diagnosis not present

## 2018-09-20 DIAGNOSIS — E559 Vitamin D deficiency, unspecified: Secondary | ICD-10-CM | POA: Diagnosis not present

## 2018-09-20 DIAGNOSIS — E7800 Pure hypercholesterolemia, unspecified: Secondary | ICD-10-CM

## 2018-09-20 NOTE — Progress Notes (Signed)
Subjective:    Patient ID: Samuel Wheeler, male    DOB: Dec 25, 1945, 72 y.o.   MRN: [id number]  HPI Pt here for follow up and management of chronic medical problems which includes hyperlipidemia. He is taking medication regularly.  The patient comes in for his regular checkup and only complains today with a head cold.  All of his vital signs are stable and his weight is up 4 pounds since the last visit.  He will get lab work today.  The patient has hyperlipidemia and hypertension.  He also has arthritic complaints and allergies.  The patient takes atorvastatin and Niaspan.  He is on cetirizine and Flonase for his allergies.  He also takes vitamin D replacement at 50,000 units weekly.  He did have a colonoscopy and 2016 and at that time had 2 sessile polyps and moderate diverticulosis.  Because of positive family history for colon cancer he will be due another colonoscopy in 2021.  He had a normal EKG and March 2018 and a stress test in March 2016.  The stress test had no signs of ischemia or infarct.  Patient is doing well overall other than this URI which she says is very minor in nature.  His wife has metastatic lung cancer and has to keep the house a lot warmer than he likes it but he goes along with this.  He has been using a concoction of honey and salt water and that he takes to help his cough and congestion and he does have some nasal saline at home.  He is not been running a fever and most of the congestion has been in his head.  He denies any chest pain pressure tightness or shortness of breath.  He denies any trouble with swallowing heartburn indigestion nausea vomiting diarrhea blood in the stool or black tarry bowel movements.  He is passing his water well and has nocturia frequently.  He is aware of his need for a colonoscopy at a 5-year interval because of the positive family history for colon cancer.  That would be in another couple years.  I also mentioned that he should consider getting  another stress test at about 5 years from the last one which would be March 2021.     Patient Active Problem List   Diagnosis Date Noted  . Benign positional vertigo 08/25/2017  . Dizziness 03/04/2017  . PVC's (premature ventricular contractions) 11/01/2014  . 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 HLD (hyperlipidemia) 01/27/2013  . BPH (benign prostatic hyperplasia) 01/27/2013  . Erectile dysfunction 01/27/2013  . Genital herpes simplex 01/27/2013  . Actinic keratosis 01/27/2013  . Need for zoster vaccination 01/27/2013   Outpatient Encounter Medications as of 09/20/2018  Medication Sig  . albuterol (PROAIR HFA) 108 (90 Base) MCG/ACT inhaler USE 2 INHALATIONS EVERY 6 HOURS AS NEEDED FOR WHEEZING OR SHORTNESS OF BREATH  . aspirin 81 MG chewable tablet Chew 81 mg by mouth daily.  Marland Kitchen atorvastatin (LIPITOR) 20 MG tablet TAKE 1 TABLET DAILY  . cetirizine (ZYRTEC) 10 MG tablet TAKE 1 TABLET DAILY  . DimenhyDRINATE (DRAMAMINE PO) Take 1 Dose by mouth as needed.   . doxazosin (CARDURA) 2 MG tablet TAKE 1 TABLET AT BEDTIME  . finasteride (PROSCAR) 5 MG tablet TAKE 1 TABLET DAILY  . fluticasone (FLONASE) 50 MCG/ACT nasal spray USE 2 SPRAYS IN EACH NOSTRIL DAILY  . meclizine (ANTIVERT) 25 MG tablet Take 1 tablet (25 mg total) by mouth 3 (three) times daily as  needed for dizziness.  Marland Kitchen NIASPAN 1000 MG CR tablet TAKE 1 TABLET AT BEDTIME  . valACYclovir (VALTREX) 1000 MG tablet TAKE 1 TABLET DAILY  . Vitamin D, Ergocalciferol, (DRISDOL) 50000 units CAPS capsule TAKE 1 CAPSULE EVERY 7 DAYS   No facility-administered encounter medications on file as of 09/20/2018.      Review of Systems  Constitutional: Negative.   HENT: Positive for congestion (head ) and postnasal drip.   Eyes: Negative.   Respiratory: Negative.   Cardiovascular: Negative.   Gastrointestinal: Negative.   Endocrine: Negative.   Genitourinary: Negative.   Musculoskeletal: Negative.   Skin: Negative.     Allergic/Immunologic: Negative.   Neurological: Negative.   Hematological: Negative.   Psychiatric/Behavioral: Negative.        Objective:   Physical Exam Vitals signs and nursing note reviewed.  Constitutional:      Appearance: Normal appearance. He is well-developed. He is obese.  HENT:     Head: Normocephalic and atraumatic.     Right Ear: Tympanic membrane, ear canal and external ear normal. There is no impacted cerumen.     Left Ear: Tympanic membrane, ear canal and external ear normal. There is no impacted cerumen.     Nose: Congestion and rhinorrhea present.     Mouth/Throat:     Mouth: Mucous membranes are moist.     Pharynx: Oropharynx is clear. No oropharyngeal exudate.  Eyes:     General: No scleral icterus.       Right eye: No discharge.        Left eye: No discharge.     Extraocular Movements: Extraocular movements intact.     Conjunctiva/sclera: Conjunctivae normal.     Pupils: Pupils are equal, round, and reactive to light.  Neck:     Musculoskeletal: Normal range of motion and neck supple. No muscular tenderness.     Thyroid: No thyromegaly.     Vascular: No carotid bruit.     Trachea: No tracheal deviation.  Cardiovascular:     Rate and Rhythm: Normal rate and regular rhythm.     Pulses: Normal pulses.     Heart sounds: Normal heart sounds. No murmur.     Comments: Heart is regular at 60/min Pulmonary:     Effort: Pulmonary effort is normal. No respiratory distress.     Breath sounds: Normal breath sounds. No wheezing or rales.     Comments: Lungs are clear anteriorly and posteriorly and no axillary adenopathy Chest:     Chest wall: No tenderness.  Abdominal:     General: Abdomen is flat. Bowel sounds are normal.     Palpations: Abdomen is soft. There is no mass.     Tenderness: There is no abdominal tenderness.     Comments: No liver or spleen enlargement.  No masses.  No tenderness.  No inguinal adenopathy.  Musculoskeletal: Normal range of motion.         General: No tenderness.     Right lower leg: No edema.     Left lower leg: No edema.  Lymphadenopathy:     Cervical: No cervical adenopathy.  Skin:    General: Skin is warm and dry.     Findings: No rash.  Neurological:     Mental Status: He is alert and oriented to person, place, and time. Mental status is at baseline.     Cranial Nerves: No cranial nerve deficit.     Deep Tendon Reflexes: Reflexes are normal and symmetric.  Psychiatric:  Mood and Affect: Mood normal.        Behavior: Behavior normal.        Thought Content: Thought content normal.        Judgment: Judgment normal.     Comments: Mood affect and behavior are all normal and positive for this patient.    BP 125/80 (BP Location: Right Arm)   Pulse 62   Temp 98 F (36.7 C) (Oral)   Ht 5' 10" (1.778 m)   Wt 204 lb (92.5 kg)   BMI 29.27 kg/m        Assessment & Plan:  1. Vitamin D deficiency -Continue with vitamin D replacement pending results of lab work - VITAMIN D 25 Hydroxy (Vit-D Deficiency, Fractures) - CBC with Differential/Platelet  2. Pure hypercholesterolemia -Continue with atorvastatin and Niaspan pending results of lab work - BMP8+EGFR - Lipid panel - Hepatic function panel - CBC with Differential/Platelet  3. Family history of heart disease -Continue with cholesterol control weight control through diet and exercise and trying to drink more water. -Consider repeat stress test in the spring 2021 which would be 5 years after the previous and with a positive family history of heart disease and the patient having hyperlipidemia. - BMP8+EGFR - Lipid panel - CBC with Differential/Platelet  4. Family history of colon cancer -Repeat colonoscopy will be due in the spring 2021. - CBC with Differential/Platelet  No orders of the defined types were placed in this encounter.  Patient Instructions                       Medicare Annual Wellness Visit  [facility] and the medical  providers at [facility] strive to bring you the best medical care.  In doing so we not only want to address your current medical conditions and concerns but also to detect new conditions early and prevent illness, disease and health-related problems.    Medicare offers a yearly Wellness Visit which allows our clinical staff to assess your need for preventative services including immunizations, lifestyle education, counseling to decrease risk of preventable diseases and screening for fall risk and other medical concerns.    This visit is provided free of charge (no copay) for all Medicare recipients. The clinical pharmacists at [facility] have begun to conduct these Wellness Visits which will also include a thorough review of all your medications.    As you primary medical provider recommend that you make an appointment for your Annual Wellness Visit if you have not done so already this year.  You may set up this appointment before you leave today or you may call back ([phone]) and schedule an appointment.  Please make sure when you call that you mention that you are scheduling your Annual Wellness Visit with the clinical pharmacist so that the appointment may be made for the proper length of time.     Continue current medications. Continue good therapeutic lifestyle changes which include good diet and exercise. Fall precautions discussed with patient. If an FOBT was given today- please return it to our front desk. If you are over 66 years old - you may need Prevnar 58 or the adult Pneumonia vaccine.  **Flu shots are available--- please call and schedule a FLU-CLINIC appointment**  After your visit with [place] today you will receive a survey in the mail or online from [employer] regarding your care with [place]. Please take a moment to fill this out. Your feedback  is very important to [place] as you can help [place] better understand your patient needs as well as  improve your experience and satisfaction. WE CARE ABOUT YOU!!!    Continue with Niaspan and atorvastatin Continue to drink plenty of fluids and stay well-hydrated Use nasal saline frequently in each nostril You will need another stress test about 5 years after your last one which would be sometime in 2021. Dr. Lynne Leader office should notify you of the date of your next colonoscopy which would also be in the spring 2021. If your current condition worsens in any way please get back in touch with [place]  Arrie Senate MD

## 2018-09-20 NOTE — Patient Instructions (Addendum)
Medicare Annual Wellness Visit  [facility] and the medical providers at [facility] strive to bring you the best medical care.  In doing so we not only want to address your current medical conditions and concerns but also to detect new conditions early and prevent illness, disease and health-related problems.    Medicare offers a yearly Wellness Visit which allows our clinical staff to assess your need for preventative services including immunizations, lifestyle education, counseling to decrease risk of preventable diseases and screening for fall risk and other medical concerns.    This visit is provided free of charge (no copay) for all Medicare recipients. The clinical pharmacists at [facility] have begun to conduct these Wellness Visits which will also include a thorough review of all your medications.    As you primary medical provider recommend that you make an appointment for your Annual Wellness Visit if you have not done so already this year.  You may set up this appointment before you leave today or you may call back ([phone]) and schedule an appointment.  Please make sure when you call that you mention that you are scheduling your Annual Wellness Visit with the clinical pharmacist so that the appointment may be made for the proper length of time.     Continue current medications. Continue good therapeutic lifestyle changes which include good diet and exercise. Fall precautions discussed with patient. If an FOBT was given today- please return it to our front desk. If you are over 69 years old - you may need Prevnar 73 or the adult Pneumonia vaccine.  **Flu shots are available--- please call and schedule a FLU-CLINIC appointment**  After your visit with [place] today you will receive a survey in the mail or online from [employer] regarding your care with [place]. Please take a moment to fill this out. Your feedback is very  important to [place] as you can help [place] better understand your patient needs as well as improve your experience and satisfaction. WE CARE ABOUT YOU!!!    Continue with Niaspan and atorvastatin Continue to drink plenty of fluids and stay well-hydrated Use nasal saline frequently in each nostril You will need another stress test about 5 years after your last one which would be sometime in 2021. Dr. Lynne Leader office should notify you of the date of your next colonoscopy which would also be in the spring 2021. If your current condition worsens in any way please get back in touch with [place]

## 2018-09-21 LAB — HEPATIC FUNCTION PANEL
ALT: 21 IU/L (ref 0–44)
AST: 18 IU/L (ref 0–40)
Albumin: 4.1 g/dL (ref 3.5–4.8)
Alkaline Phosphatase: 76 IU/L (ref 39–117)
BILIRUBIN TOTAL: 0.6 mg/dL (ref 0.0–1.2)
Bilirubin, Direct: 0.16 mg/dL (ref 0.00–0.40)
Total Protein: 6.7 g/dL (ref 6.0–8.5)

## 2018-09-21 LAB — LIPID PANEL
Chol/HDL Ratio: 3.1 ratio (ref 0.0–5.0)
Cholesterol, Total: 148 mg/dL (ref 100–199)
HDL: 48 mg/dL (ref >39)
LDL Calculated: 78 mg/dL (ref 0–99)
TRIGLYCERIDES: 110 mg/dL (ref 0–149)
VLDL Cholesterol Cal: 22 mg/dL (ref 5–40)

## 2018-09-21 LAB — CBC WITH DIFFERENTIAL/PLATELET
BASOS ABS: 0.1 10*3/uL (ref 0.0–0.2)
Basos: 1 %
EOS (ABSOLUTE): 0.4 10*3/uL (ref 0.0–0.4)
Eos: 4 %
HEMOGLOBIN: 14.7 g/dL (ref 13.0–17.7)
Hematocrit: 43.5 % (ref 37.5–51.0)
Immature Grans (Abs): 0 10*3/uL (ref 0.0–0.1)
Immature Granulocytes: 0 %
LYMPHS ABS: 3.1 10*3/uL (ref 0.7–3.1)
Lymphs: 33 %
MCH: 29.9 pg (ref 26.6–33.0)
MCHC: 33.8 g/dL (ref 31.5–35.7)
MCV: 89 fL (ref 79–97)
MONOCYTES: 5 %
Monocytes Absolute: 0.5 10*3/uL (ref 0.1–0.9)
NEUTROS ABS: 5.3 10*3/uL (ref 1.4–7.0)
Neutrophils: 57 %
Platelets: 237 10*3/uL (ref 150–450)
RBC: 4.91 x10E6/uL (ref 4.14–5.80)
RDW: 12.8 % (ref 12.3–15.4)
WBC: 9.3 10*3/uL (ref 3.4–10.8)

## 2018-09-21 LAB — BMP8+EGFR
BUN/Creatinine Ratio: 20 (ref 10–24)
BUN: 19 mg/dL (ref 8–27)
CALCIUM: 9 mg/dL (ref 8.6–10.2)
CO2: 24 mmol/L (ref 20–29)
Chloride: 103 mmol/L (ref 96–106)
Creatinine, Ser: 0.94 mg/dL (ref 0.76–1.27)
GFR calc Af Amer: 94 mL/min/{1.73_m2} (ref >59)
GFR calc non Af Amer: 81 mL/min/{1.73_m2} (ref >59)
GLUCOSE: 92 mg/dL (ref 65–99)
POTASSIUM: 4.2 mmol/L (ref 3.5–5.2)
Sodium: 142 mmol/L (ref 134–144)

## 2018-09-21 LAB — VITAMIN D 25 HYDROXY (VIT D DEFICIENCY, FRACTURES): VIT D 25 HYDROXY: 39 ng/mL (ref 30.0–100.0)

## 2018-10-02 ENCOUNTER — Other Ambulatory Visit: Payer: Self-pay | Admitting: Family Medicine

## 2018-10-11 DIAGNOSIS — L570 Actinic keratosis: Secondary | ICD-10-CM | POA: Diagnosis not present

## 2018-10-11 DIAGNOSIS — D239 Other benign neoplasm of skin, unspecified: Secondary | ICD-10-CM | POA: Diagnosis not present

## 2018-10-11 DIAGNOSIS — Z85828 Personal history of other malignant neoplasm of skin: Secondary | ICD-10-CM | POA: Diagnosis not present

## 2018-10-26 ENCOUNTER — Other Ambulatory Visit: Payer: Self-pay | Admitting: Family Medicine

## 2018-11-15 ENCOUNTER — Other Ambulatory Visit: Payer: Self-pay | Admitting: Family Medicine

## 2018-12-03 ENCOUNTER — Ambulatory Visit (INDEPENDENT_AMBULATORY_CARE_PROVIDER_SITE_OTHER): Payer: Medicare Other | Admitting: *Deleted

## 2018-12-03 VITALS — BP 125/66 | HR 71 | Temp 98.2°F | Ht 70.0 in | Wt 208.0 lb

## 2018-12-03 DIAGNOSIS — Z0000 Encounter for general adult medical examination without abnormal findings: Secondary | ICD-10-CM

## 2018-12-03 MED ORDER — VITAMIN D (ERGOCALCIFEROL) 1.25 MG (50000 UNIT) PO CAPS: 50000.0000 [IU] | ORAL_CAPSULE | ORAL | 3 refills | Status: DC

## 2018-12-03 NOTE — Progress Notes (Addendum)
Subjective:   Samuel Wheeler is a 73 y.o. male who presents for Medicare Annual/Subsequent preventive examination. 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 He does not get any regular exercise, but does stay busy at all times. He states that his diet is semi healthy and that he usually eats 3 meals a day and an occasional small snack. He is not currently active in church. He lives with his wife, Samuel Wheeler and her sister-n-law lives in a apartment area attached to their house. He had 6 step children and one recently passed away due to prostate cancer. He has 10 cats, inside and out that he has taken in over time. Fall hazards and risks were discussed today. He states that his health is about the same, if not a little better than it was a year ago.    Cardiac Risk Factors include: none     Objective:    Vitals: BP 125/66 (BP Location: Left Arm)   Pulse 71   Temp 98.2 F (36.8 C) (Oral)   Ht 5\' 10"  (1.778 m)   Wt 208 lb (94.3 kg)   BMI 29.84 kg/m   Body mass index is 29.84 kg/m.  Advanced Directives 12/03/2018 01/28/2017 12/16/2016 01/25/2015 01/22/2015 01/02/2015  Does Patient Have a Medical Advance Directive? Yes Yes No Yes Yes Yes  Type of Advance Directive Living will Living will - - [place];Living will Living will;Healthcare Power of Attorney  Does patient want to make changes to medical advance directive? - - - - - No - Patient declined  Copy of [place] in Chart? - - - No - copy requested - No - copy requested    Tobacco Social History   Tobacco Use  Smoking Status Never Smoker  Smokeless Tobacco Never Used     Counseling given: Not Answered   Past Medical History:  Diagnosis Date  . Actinic keratosis   . Allergy    Year Round   . Arthritis   . BPH (benign prostatic hyperplasia)   . Cancer ([place])    skin  cancer - removed several times  . Cataract 2015   Right Eye   . ED (erectile dysfunction)   . Hyperlipidemia    Past Surgical History:  Procedure Laterality Date  . CERVICAL SPINE SURGERY    . COLONOSCOPY  2009   tics only  . EYE SURGERY Right    cataract   . right shoulder    . skin cancer removal     right ear x 2   Family History  Problem Relation Age of Onset  . Stroke Mother 16  . Stroke Father 69  . Diabetes Father   . COPD Father   . Heart disease Sister        "Mild heart attack", pacemaker  . Stroke Brother 82  . Syncope episode Brother   . Parkinson's disease Sister   . Stroke Sister   . Heart disease Sister   . Cancer Sister 42       Colon Cancer  . Cancer Sister        Skin Cancers  . Cancer Sister        Melanoma  . Cancer Sister        Skin Cancer  . Cancer Maternal Aunt        bone  Social History   Socioeconomic History  . Marital status: Married    Spouse name: Samuel Wheeler  . Number of children: 6  . Years of education: Some college  . Highest education level: Not on file  Occupational History  . 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 Financial resource strain: Not on file  . Food insecurity:    Worry: Not on file    Inability: Not on file  . Transportation needs:    Medical: Not on file    Non-medical: Not on file  Tobacco Use  . Smoking status: Never Smoker  . Smokeless tobacco: Never Used  Substance and Sexual Activity  . Alcohol use: No  . Drug use: No  . Sexual activity: Yes    Birth control/protection: None  Lifestyle  . Physical activity:    Days per week: Not on file    Minutes per session: Not on file  . Stress: Not on file  Relationships  . Social connections:    Talks on phone: Not on file    Gets together: Not on file    Attends religious service: Not on file    Active member of club or organization: Not on file    Attends meetings of clubs or organizations: Not on file      Relationship status: Not on file  Other Topics Concern  . Not on file  Social History Narrative   Lives with wife.     His sister in law just moved in- apartment area in garage     He has six step children, 1 passed aware -prostate cancer age 24      Caffeine use: 2 cups coffee per day    Right handed     Outpatient Encounter Medications as of 12/03/2018  Medication Sig  . albuterol (PROAIR HFA) 108 (90 Base) MCG/ACT inhaler USE 2 INHALATIONS EVERY 6 HOURS AS NEEDED FOR WHEEZING OR SHORTNESS OF BREATH  . aspirin 81 MG chewable tablet Chew 81 mg by mouth daily.  Marland Kitchen atorvastatin (LIPITOR) 20 MG tablet TAKE 1 TABLET DAILY  . cetirizine (ZYRTEC) 10 MG tablet TAKE 1 TABLET DAILY  . DimenhyDRINATE (DRAMAMINE PO) Take 1 Dose by mouth as needed.   . doxazosin (CARDURA) 2 MG tablet TAKE 1 TABLET AT BEDTIME  . finasteride (PROSCAR) 5 MG tablet TAKE 1 TABLET DAILY  . fluticasone (FLONASE) 50 MCG/ACT nasal spray USE 2 SPRAYS IN EACH NOSTRIL DAILY  . meclizine (ANTIVERT) 25 MG tablet Take 1 tablet (25 mg total) by mouth 3 (three) times daily as needed for dizziness.  Marland Kitchen NIASPAN 1000 MG CR tablet TAKE 1 TABLET AT BEDTIME  . valACYclovir (VALTREX) 1000 MG tablet TAKE 1 TABLET DAILY  . Vitamin D, Ergocalciferol, (DRISDOL) 1.25 MG (50000 UT) CAPS capsule Take 1 capsule (50,000 Units total) by mouth every 7 (seven) days.  . [DISCONTINUED] Vitamin D, Ergocalciferol, (DRISDOL) 50000 units CAPS capsule TAKE 1 CAPSULE EVERY 7 DAYS   No facility-administered encounter medications on file as of 12/03/2018.     Activities of Daily Living In your present state of health, do you have any difficulty performing the following activities: 12/03/2018  Hearing? Y  Vision? N  Difficulty concentrating or making decisions? N  Walking or climbing stairs? N  Dressing or bathing? N  Doing errands, shopping? N  Preparing Food and eating ? N  Using the Toilet? N  In the past six months, have you accidently  leaked  urine? N  Do you have problems with loss of bowel control? N  Managing your Medications? N  Managing your Finances? N  Housekeeping or managing your Housekeeping? N  Some recent data might be hidden    Patient Care Team: Chipper Herb, MD as PCP - General (Family Medicine) Minus Breeding, MD as Consulting Physician (Cardiology) Ladene Artist, MD as Consulting Physician (Gastroenterology) Sandford Craze, MD as Referring Physician (Unknown Physician Specialty) Latanya Maudlin, MD as Consulting Physician (Orthopedic Surgery) Harlen Labs, MD as Referring Physician (Optometry)   Assessment:   This is a routine wellness examination for [place].  Exercise Activities and Dietary recommendations Current Exercise Habits: The patient does not participate in regular exercise at present(does yard work and wood work ), Exercise limited by: None identified  Goals    . Increase physical activity     Work on Personnel officer     . Prevent Falls     Has dizziness - wants to figure out what is causing it.        Fall Risk Fall Risk  12/03/2018 09/20/2018 04/14/2018 03/10/2018 02/24/2018  Falls in the past year? 0 0 No No No  Number falls in past yr: - - - - -  Injury with Fall? - - - - -  Comment - - - - -   Is the patient's home free of loose throw rugs in walkways, pet beds, electrical cords, etc? Fall risks and hazards were discussed today.  Depression Screen PHQ 2/9 Scores 12/03/2018 09/20/2018 04/14/2018 03/10/2018  PHQ - 2 Score 0 0 0 0    Cognitive Function MMSE - Mini Mental State Exam 12/03/2018 01/28/2017 01/02/2015  Orientation to time 5 5 5   Orientation to Place 5 5 5   Registration 3 3 3   Attention/ Calculation 5 5 5   Recall 3 3 3   Language- name 2 objects 2 2 2   Language- repeat 1 1 1   Language- follow 3 step command 3 3 3   Language- read & follow direction 1 1 1   Write a sentence 1 1 1   Copy design 1 1 1   Total score 30 30 30         Immunization History    Administered Date(s) Administered  . Influenza, High Dose Seasonal PF 07/20/2017, 06/30/2018  . Influenza,inj,Quad PF,6+ Mos 08/03/2013, 07/10/2014, 07/09/2015, 07/08/2016  . Pneumococcal Conjugate-13 07/20/2014  . Pneumococcal Polysaccharide-23 07/06/2012  . Tdap 06/11/2009  . Zoster 01/27/2013  . Zoster Recombinat (Shingrix) 03/04/2017, 08/25/2017    Qualifies for Shingles Vaccine? All shingles vaccines have been given   Screening Tests Health Maintenance  Topic Date Due  . TETANUS/TDAP  06/12/2019  . COLONOSCOPY  01/25/2020  . INFLUENZA VACCINE  Completed  . Hepatitis C Screening  Completed  . PNA vac Low Risk Adult  Completed   Cancer Screenings: Lung: Low Dose CT Chest recommended if Age 56-80 years, 30 pack-year currently smoking OR have quit w/in 15years. Patient does not qualify. Colorectal: done 03/2018.  Additional Screenings: declined  Hepatitis C Screening:      Plan:   pt is to keep follow up with Dr Laurance Flatten and other specialist He will try to increased activity by walking regularly. He is up to date on HM    I have personally reviewed and noted the following in the patient's chart:   . Medical and social history . Use of alcohol, tobacco or illicit drugs  . Current medications and supplements . Functional ability and status .  Nutritional status . Physical activity . Advanced directives . List of other physicians . Hospitalizations, surgeries, and ER visits in previous 12 months . Vitals . Screenings to include cognitive, depression, and falls . Referrals and appointments  In addition, I have reviewed and discussed with patient certain preventive protocols, quality metrics, and best practice recommendations. A written personalized care plan for preventive services as well as general preventive health recommendations were provided to patient.     Renea Schoonmaker, Cameron Proud, LPN  [id number]/41/[id number]  I have reviewed and agree with the above AWV documentation.    Mary-Margaret Hassell Done, FNP

## 2018-12-03 NOTE — Patient Instructions (Signed)
  [name] , Thank you for taking time to come for your Medicare Wellness Visit. I appreciate your ongoing commitment to your health goals. Please review the following plan we discussed and let me know if I can assist you in the future.   These are the goals we discussed: Goals    . Increase physical activity     Work on Personnel officer     . Prevent Falls     Has dizziness - wants to figure out what is causing it.        This is a list of the screening recommended for you and due dates:  Health Maintenance  Topic Date Due  . Tetanus Vaccine  06/12/2019  . Colon Cancer Screening  01/25/2020  . Flu Shot  Completed  .  Hepatitis C: One time screening is recommended by Center for Disease Control  (CDC) for  adults born from [birth year] through 1964.   Completed  . Pneumonia vaccines  Completed    Keep follow up with Dr Laurance Flatten and other specialist

## 2019-03-08 ENCOUNTER — Other Ambulatory Visit: Payer: Self-pay

## 2019-03-08 ENCOUNTER — Encounter: Payer: Self-pay | Admitting: Family Medicine

## 2019-03-08 ENCOUNTER — Ambulatory Visit (INDEPENDENT_AMBULATORY_CARE_PROVIDER_SITE_OTHER): Payer: Medicare Other | Admitting: Family Medicine

## 2019-03-08 DIAGNOSIS — H8110 Benign paroxysmal vertigo, unspecified ear: Secondary | ICD-10-CM

## 2019-03-08 DIAGNOSIS — D692 Other nonthrombocytopenic purpura: Secondary | ICD-10-CM

## 2019-03-08 DIAGNOSIS — Z800 Family history of malignant neoplasm of digestive organs: Secondary | ICD-10-CM | POA: Diagnosis not present

## 2019-03-08 DIAGNOSIS — N5201 Erectile dysfunction due to arterial insufficiency: Secondary | ICD-10-CM | POA: Diagnosis not present

## 2019-03-08 DIAGNOSIS — E782 Mixed hyperlipidemia: Secondary | ICD-10-CM

## 2019-03-08 DIAGNOSIS — Z0000 Encounter for general adult medical examination without abnormal findings: Secondary | ICD-10-CM | POA: Diagnosis not present

## 2019-03-08 DIAGNOSIS — N400 Enlarged prostate without lower urinary tract symptoms: Secondary | ICD-10-CM

## 2019-03-08 DIAGNOSIS — Z1211 Encounter for screening for malignant neoplasm of colon: Secondary | ICD-10-CM

## 2019-03-08 DIAGNOSIS — E559 Vitamin D deficiency, unspecified: Secondary | ICD-10-CM | POA: Diagnosis not present

## 2019-03-08 DIAGNOSIS — Z8249 Family history of ischemic heart disease and other diseases of the circulatory system: Secondary | ICD-10-CM

## 2019-03-08 DIAGNOSIS — I493 Ventricular premature depolarization: Secondary | ICD-10-CM

## 2019-03-08 NOTE — Progress Notes (Signed)
Virtual Visit Via telephone Note I connected with@ on 03/08/19 by telephone and verified that I am speaking with the correct person or authorized healthcare agent using two identifiers. Samuel Wheeler is currently located at home and there are no unauthorized people in close proximity. I completed this visit while in a private location in my home .  This visit type was conducted due to national recommendations for restrictions regarding the COVID-19 Pandemic (e.g. social distancing).  This format is felt to be most appropriate for this patient at this time.  All issues noted in this document were discussed and addressed.  No physical exam was performed.    I discussed the limitations, risks, security and privacy concerns of performing an evaluation and management service by telephone and the availability of in person appointments. I also discussed with the patient that there may be a patient responsible charge related to this service. The patient expressed understanding and agreed to proceed.   Date:  03/08/2019    ID:  Pilar Grammes      01/15/1946        [id number]   Patient Care Team Patient Care Team: Chipper Herb, MD as PCP - General (Family Medicine) Minus Breeding, MD as Consulting Physician (Cardiology) Ladene Artist, MD as Consulting Physician (Gastroenterology) Sandford Craze, MD as Referring Physician (Unknown Physician Specialty) Latanya Maudlin, MD as Consulting Physician (Orthopedic Surgery) Harlen Labs, MD as Referring Physician [facility])  Reason for Visit: Primary Care Follow-up     History of Present Illness & Review of Systems:     Samuel Wheeler is a 73 y.o. year old male primary care patient that presents today for a telehealth visit.  The patient is pleasant and alert.  The patient is doing well overall and enjoying being outside and staying busy.  The patient had a visit to see the cardiologist in the past year or so and was given a good report  but because of his own family history of heart disease.  I recommended that he should follow-up periodically with a cardiologist for good cardiac exam every 3 to 4 years.  Today he denies any chest pain pressure tightness or shortness of breath.  He denies any trouble with swallowing heartburn indigestion nausea vomiting diarrhea blood in the stool or black tarry bowel movements.  He is passing his water well and does take 2 medications which he has been on for several years for his BPH symptoms and these have controlled his symptoms well and he will continue to take those and we will do the rectal exams and check the PSA is here in this office.  Is a family history of colon cancer and his next colonoscopy will not be due until April 2021.  This was by Dr. Fuller Plan.  Review of systems as stated, otherwise negative.  The patient does not have symptoms concerning for COVID-19 infection (fever, chills, cough, or new shortness of breath).      Current Medications (Verified) Allergies as of 03/08/2019      Reactions   Poison Ivy Extract       Medication List       Accurate as of March 08, 2019 11:30 AM. If you have any questions, ask your nurse or doctor.        albuterol 108 (90 Base) MCG/ACT inhaler Commonly known as:  ProAir HFA USE 2 INHALATIONS EVERY 6 HOURS AS NEEDED FOR WHEEZING OR SHORTNESS OF BREATH  aspirin 81 MG chewable tablet Chew 81 mg by mouth daily.   atorvastatin 20 MG tablet Commonly known as:  LIPITOR TAKE 1 TABLET DAILY   cetirizine 10 MG tablet Commonly known as:  ZYRTEC TAKE 1 TABLET DAILY   doxazosin 2 MG tablet Commonly known as:  CARDURA TAKE 1 TABLET AT BEDTIME   DRAMAMINE PO Take 1 Dose by mouth as needed.   finasteride 5 MG tablet Commonly known as:  PROSCAR TAKE 1 TABLET DAILY   fluticasone 50 MCG/ACT nasal spray Commonly known as:  FLONASE USE 2 SPRAYS IN EACH NOSTRIL DAILY   meclizine 25 MG tablet Commonly known as:  ANTIVERT Take 1 tablet (25  mg total) by mouth 3 (three) times daily as needed for dizziness.   Niaspan 1000 MG CR tablet Generic drug:  niacin TAKE 1 TABLET AT BEDTIME   valACYclovir 1000 MG tablet Commonly known as:  VALTREX TAKE 1 TABLET DAILY   Vitamin D (Ergocalciferol) 1.25 MG (50000 UT) Caps capsule Commonly known as:  DRISDOL Take 1 capsule (50,000 Units total) by mouth every 7 (seven) days.           Allergies (Verified)    Poison ivy extract  Past Medical History Past Medical History:  Diagnosis Date  . Actinic keratosis   . Allergy    Year Round   . Arthritis   . BPH (benign prostatic hyperplasia)   . Cancer ([place])    skin cancer - removed several times  . Cataract 2015   Right Eye   . ED (erectile dysfunction)   . Hyperlipidemia      Past Surgical History:  Procedure Laterality Date  . CERVICAL SPINE SURGERY    . COLONOSCOPY  2009   tics only  . EYE SURGERY Right    cataract   . right shoulder    . skin cancer removal     right ear x 2    Social History   Socioeconomic History  . Marital status: Married    Spouse name: [name]  . Number of children: 6  . Years of education: Some college  . Highest education level: Not on file  Occupational History  . 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 Financial resource strain: Not on file  . Food insecurity:    Worry: Not on file    Inability: Not on file  . Transportation needs:    Medical: Not on file    Non-medical: Not on file  Tobacco Use  . Smoking status: Never Smoker  . Smokeless tobacco: Never Used  Substance and Sexual Activity  . Alcohol use: No  . Drug use: No  . Sexual activity: Yes    Birth control/protection: None  Lifestyle  . Physical activity:    Days per week: Not on file    Minutes per session: Not on file  . Stress: Not on file  Relationships  . Social connections:    Talks on phone: Not on file    Gets together: Not on file    Attends religious  service: Not on file    Active member of club or organization: Not on file    Attends meetings of clubs or organizations: Not on file    Relationship status: Not on file  Other Topics Concern  . Not on file  Social History Narrative   Lives with wife.     His sister in law just moved in-  apartment area in garage     He has six step children, 1 passed aware -prostate cancer age 31      Caffeine use: 2 cups coffee per day    Right handed      Family History  Problem Relation Age of Onset  . Stroke Mother 26  . Stroke Father 12  . Diabetes Father   . COPD Father   . Heart disease Sister        "Mild heart attack", pacemaker  . Stroke Brother 76  . Syncope episode Brother   . Parkinson's disease Sister   . Stroke Sister   . Heart disease Sister   . Cancer Sister 63       Colon Cancer  . Cancer Sister        Skin Cancers  . Cancer Sister        Melanoma  . Cancer Sister        Skin Cancer  . Cancer Maternal Aunt        bone      Labs/Other Tests and Data Reviewed:    Wt Readings from Last 3 Encounters:  12/03/18 208 lb (94.3 kg)  09/20/18 204 lb (92.5 kg)  04/14/18 200 lb (90.7 kg)   Temp Readings from Last 3 Encounters:  12/03/18 98.2 F (36.8 C) (Oral)  09/20/18 98 F (36.7 C) (Oral)  04/14/18 (!) 97.3 F (36.3 C) (Oral)   BP Readings from Last 3 Encounters:  12/03/18 125/66  09/20/18 125/80  04/14/18 (!) 147/77   Pulse Readings from Last 3 Encounters:  12/03/18 71  09/20/18 62  04/14/18 (!) 59     No results found for: HGBA1C Lab Results  Component Value Date   LDLCALC 78 09/20/2018   CREATININE 0.94 09/20/2018       Chemistry      Component Value Date/Time   NA 142 09/20/2018 1600   K 4.2 09/20/2018 1600   CL 103 09/20/2018 1600   CO2 24 09/20/2018 1600   BUN 19 09/20/2018 1600   CREATININE 0.94 09/20/2018 1600   CREATININE 1.02 01/27/2013 1108      Component Value Date/Time   CALCIUM 9.0 09/20/2018 1600   ALKPHOS 76 09/20/2018  1600   AST 18 09/20/2018 1600   ALT 21 09/20/2018 1600   BILITOT 0.6 09/20/2018 1600         OBSERVATIONS/ OBJECTIVE:     Patient is positive and alert and feeling well and sounds this way over the phone.  He says his weight is slightly up and slightly greater than 200.  His blood pressure at home is running around 129/82.  He gets his eye exams regularly.  As long as his rectal exam is concerned if the PSA is stable we will have him get his rectal exam at the next visit.  He was informed of this.  Physical exam deferred due to nature of telephonic visit.  ASSESSMENT & PLAN    Time:   Today, I have spent 21 minutes with the patient via telephone discussing the above including Covid precautions.     Visit Diagnoses: 1. Mixed hyperlipidemia -The patient will continue with his Niaspan and atorvastatin with as aggressive therapeutic lifestyle changes as possible including diet and exercise.  2. Family history of colon cancer -His next colonoscopy is due in April 2021 by Dr. Fuller Plan.  He was made aware of this today.  He will come by and pick up an FOBT card and I told  him that I would normally do this on the anniversary of every colonoscopy.  3. Benign prostatic hyperplasia, unspecified whether lower urinary tract symptoms present -Continue with doxazosin and finasteride.  This is helped with his voiding and he has been on this for years.  4. Senile purpura (HCC) -No complaints today of increased bruising or bleeding.  5. Vitamin D deficiency -Continue with vitamin D replacement pending results of lab work  6. Family history of heart disease -Periodic follow-ups with cardiology because of family history of heart disease  7. Benign paroxysmal positional vertigo, unspecified laterality -This is been doing well recently.  Patient continues to keep a supply of meclizine on hand if needed for this.  He also has Dramamine.  8. Erectile dysfunction due to arterial insufficiency -He  does not take anything for this.  9. PVC's (premature ventricular contractions) -Follow-up with cardiology as needed  Patient Instructions  Rectal exam at next visit if PSA is stable on this blood draw Continue to practice good hand and respiratory hygiene Continue to drink plenty of fluids and stay well-hydrated Continue with current medications Continue with as aggressive therapeutic lifestyle changes as possible including diet and exercise to achieve weight loss and keep cholesterol under control Get cardiac exam every 4 years or so with cardiologist because of family history of heart disease and increased risk factors for patient     The above assessment and management plan was discussed with the patient. The patient verbalized understanding of and has agreed to the management plan. Patient is aware to call the clinic if symptoms persist or worsen. Patient is aware when to return to the clinic for a follow-up visit. Patient educated on when it is appropriate to go to the emergency department.    Chipper Herb, MD [facility] [place], [place], [place] [address] Ph [phone]   Arrie Senate MD

## 2019-03-08 NOTE — Patient Instructions (Signed)
Rectal exam at next visit if PSA is stable on this blood draw Continue to practice good hand and respiratory hygiene Continue to drink plenty of fluids and stay well-hydrated Continue with current medications Continue with as aggressive therapeutic lifestyle changes as possible including diet and exercise to achieve weight loss and keep cholesterol under control Get cardiac exam every 4 years or so with cardiologist because of family history of heart disease and increased risk factors for patient

## 2019-03-09 LAB — CBC WITH DIFFERENTIAL/PLATELET
Basophils Absolute: 0.1 10*3/uL (ref 0.0–0.2)
Basos: 1 %
EOS (ABSOLUTE): 0.4 10*3/uL (ref 0.0–0.4)
Eos: 5 %
Hematocrit: 44.4 % (ref 37.5–51.0)
Hemoglobin: 14.6 g/dL (ref 13.0–17.7)
Immature Grans (Abs): 0 10*3/uL (ref 0.0–0.1)
Immature Granulocytes: 0 %
Lymphocytes Absolute: 3 10*3/uL (ref 0.7–3.1)
Lymphs: 41 %
MCH: 29.9 pg (ref 26.6–33.0)
MCHC: 32.9 g/dL (ref 31.5–35.7)
MCV: 91 fL (ref 79–97)
Monocytes Absolute: 0.5 10*3/uL (ref 0.1–0.9)
Monocytes: 7 %
Neutrophils Absolute: 3.5 10*3/uL (ref 1.4–7.0)
Neutrophils: 46 %
Platelets: 219 10*3/uL (ref 150–450)
RBC: 4.89 x10E6/uL (ref 4.14–5.80)
RDW: 13 % (ref 11.6–15.4)
WBC: 7.4 10*3/uL (ref 3.4–10.8)

## 2019-03-09 LAB — PSA, TOTAL AND FREE
PSA, Free Pct: 11.3 %
PSA, Free: 0.09 ng/mL
Prostate Specific Ag, Serum: 0.8 ng/mL (ref 0.0–4.0)

## 2019-03-09 LAB — THYROID PANEL WITH TSH
Free Thyroxine Index: 2.2 (ref 1.2–4.9)
T3 Uptake Ratio: 29 % (ref 24–39)
T4, Total: 7.5 ug/dL (ref 4.5–12.0)
TSH: 3.95 u[IU]/mL (ref 0.450–4.500)

## 2019-03-09 LAB — LIPID PANEL
Chol/HDL Ratio: 3.2 ratio (ref 0.0–5.0)
Cholesterol, Total: 139 mg/dL (ref 100–199)
HDL: 44 mg/dL (ref >39)
LDL Calculated: 76 mg/dL (ref 0–99)
Triglycerides: 95 mg/dL (ref 0–149)
VLDL Cholesterol Cal: 19 mg/dL (ref 5–40)

## 2019-03-09 LAB — HEPATIC FUNCTION PANEL
ALT: 23 IU/L (ref 0–44)
AST: 18 IU/L (ref 0–40)
Albumin: 3.9 g/dL (ref 3.7–4.7)
Alkaline Phosphatase: 69 IU/L (ref 39–117)
Bilirubin Total: 0.8 mg/dL (ref 0.0–1.2)
Bilirubin, Direct: 0.16 mg/dL (ref 0.00–0.40)
Total Protein: 6.6 g/dL (ref 6.0–8.5)

## 2019-03-09 LAB — BMP8+EGFR
BUN/Creatinine Ratio: 27 — ABNORMAL HIGH (ref 10–24)
BUN: 24 mg/dL (ref 8–27)
CO2: 24 mmol/L (ref 20–29)
Calcium: 8.9 mg/dL (ref 8.6–10.2)
Chloride: 106 mmol/L (ref 96–106)
Creatinine, Ser: 0.9 mg/dL (ref 0.76–1.27)
GFR calc Af Amer: 98 mL/min/{1.73_m2} (ref >59)
GFR calc non Af Amer: 85 mL/min/{1.73_m2} (ref >59)
Glucose: 89 mg/dL (ref 65–99)
Potassium: 4.2 mmol/L (ref 3.5–5.2)
Sodium: 143 mmol/L (ref 134–144)

## 2019-03-09 LAB — VITAMIN D 25 HYDROXY (VIT D DEFICIENCY, FRACTURES): Vit D, 25-Hydroxy: 41.8 ng/mL (ref 30.0–100.0)

## 2019-04-02 ENCOUNTER — Other Ambulatory Visit: Payer: Self-pay | Admitting: Family Medicine

## 2019-04-07 ENCOUNTER — Other Ambulatory Visit: Payer: Self-pay | Admitting: Family Medicine

## 2019-04-21 ENCOUNTER — Other Ambulatory Visit: Payer: Self-pay | Admitting: *Deleted

## 2019-04-21 MED ORDER — FLUTICASONE PROPIONATE 50 MCG/ACT NA SUSP: 2.0000 | Freq: Every day | NASAL | 3 refills | Status: DC

## 2019-04-24 ENCOUNTER — Other Ambulatory Visit: Payer: Self-pay | Admitting: Family Medicine

## 2019-06-21 ENCOUNTER — Ambulatory Visit (INDEPENDENT_AMBULATORY_CARE_PROVIDER_SITE_OTHER): Payer: Medicare Other | Admitting: *Deleted

## 2019-06-21 ENCOUNTER — Other Ambulatory Visit: Payer: Self-pay

## 2019-06-21 DIAGNOSIS — Z23 Encounter for immunization: Secondary | ICD-10-CM | POA: Diagnosis not present

## 2019-06-24 ENCOUNTER — Other Ambulatory Visit: Payer: Self-pay | Admitting: Family Medicine

## 2019-08-16 ENCOUNTER — Other Ambulatory Visit: Payer: Self-pay | Admitting: Family Medicine

## 2019-09-29 ENCOUNTER — Other Ambulatory Visit: Payer: Self-pay | Admitting: Family Medicine

## 2019-10-03 ENCOUNTER — Other Ambulatory Visit: Payer: Self-pay | Admitting: Family Medicine

## 2019-10-03 MED ORDER — DOXAZOSIN MESYLATE 2 MG PO TABS: 2.0000 mg | ORAL_TABLET | Freq: Every day | ORAL | 0 refills | Status: DC

## 2019-10-03 NOTE — Telephone Encounter (Signed)
Needs to be seen.  Rx renewed x1 until OV

## 2019-11-04 ENCOUNTER — Other Ambulatory Visit: Payer: Self-pay | Admitting: Family Medicine

## 2019-11-04 NOTE — Addendum Note (Signed)
Addended by: Antonietta Barcelona D on: 11/04/2019 11:17 AM   Modules accepted: Orders

## 2019-11-14 NOTE — Progress Notes (Signed)
Assessment & Plan:  1. Benign prostatic hyperplasia, unspecified whether lower urinary tract symptoms present - Well controlled on current regimen.  - doxazosin (CARDURA) 2 MG tablet; Take 1 tablet (2 mg total) by mouth at bedtime. Needs OV  Dispense: 90 tablet; Refill: 2 - finasteride (PROSCAR) 5 MG tablet; Take 1 tablet (5 mg total) by mouth daily.  Dispense: 90 tablet; Refill: 2 - CMP14+EGFR  2. Genital herpes simplex, unspecified site - Well controlled on current regimen.  - valACYclovir (VALTREX) 1000 MG tablet; Take 1 tablet (1,000 mg total) by mouth daily.  Dispense: 90 tablet; Refill: 2 - CMP14+EGFR  3. Mixed hyperlipidemia - Well controlled on current regimen.  - niacin (NIASPAN) 1000 MG CR tablet; Take 1 tablet (1,000 mg total) by mouth at bedtime.  Dispense: 90 tablet; Refill: 2 - CMP14+EGFR - Lipid panel - atorvastatin (LIPITOR) 20 MG tablet; Take 1 tablet (20 mg total) by mouth daily.  Dispense: 90 tablet; Refill: 2  4. Seasonal allergies - Well controlled on current regimen.  - cetirizine (ZYRTEC) 10 MG tablet; Take 1 tablet (10 mg total) by mouth daily.  Dispense: 90 tablet; Refill: 2 - fluticasone (FLONASE) 50 MCG/ACT nasal spray; Place 2 sprays into both nostrils daily.  Dispense: 16 g; Refill: 5  5. Vitamin D deficiency - Well controlled on current regimen.  - Vitamin D, Ergocalciferol, (DRISDOL) 1.25 MG (50000 UNIT) CAPS capsule; Take 1 capsule (50,000 Units total) by mouth every 7 (seven) days.  Dispense: 12 capsule; Refill: 2  6. Immunization due - TDAP administered in office today.   Return in about 6 months (around 05/15/2020) for follow-up of chronic medication conditions.  Samuel Limes, MSN, APRN, FNP-C [facility]  Subjective:    Patient ID: Samuel Wheeler, male    DOB: 1946/07/29, 74 y.o.   MRN: [id number]  Patient Care Team: Samuel Brooklyn, FNP as PCP - General (Family Medicine) Minus Breeding, MD as Consulting  Physician (Cardiology) Ladene Artist, MD as Consulting Physician (Gastroenterology) Sandford Craze, MD as Referring Physician (Unknown Physician Specialty) Latanya Maudlin, MD as Consulting Physician (Orthopedic Surgery) Harlen Labs, MD as Referring Physician (Optometry)   Chief Complaint:  Chief Complaint  Patient presents with  . Establish Care    was a Dr. Laurance Wheeler patient. needs med refills     HPI: Samuel Wheeler is a 74 y.o. male presenting on 11/16/2019 for Establish Care (was a Dr. Laurance Wheeler patient. needs med refills )  Patient reports BPH is well controlled with medication.  New complaints: None  Social history:  Relevant past medical, surgical, family and social history reviewed and updated as indicated. Interim medical history since our last visit reviewed.  Allergies and medications reviewed and updated.  DATA REVIEWED: CHART IN EPIC  ROS: Negative unless specifically indicated above in HPI.    Current Outpatient Medications:  .  albuterol (PROAIR HFA) 108 (90 Base) MCG/ACT inhaler, USE 2 INHALATIONS EVERY 6 HOURS AS NEEDED FOR WHEEZING OR SHORTNESS OF BREATH, Disp: 51 g, Rfl: 3 .  aspirin 81 MG chewable tablet, Chew 81 mg by mouth daily., Disp: , Rfl:  .  atorvastatin (LIPITOR) 20 MG tablet, Take 1 tablet (20 mg total) by mouth daily., Disp: 90 tablet, Rfl: 2 .  cetirizine (ZYRTEC) 10 MG tablet, Take 1 tablet (10 mg total) by mouth daily., Disp: 90 tablet, Rfl: 2 .  DimenhyDRINATE (DRAMAMINE PO), Take 1 Dose by mouth as needed. , Disp: , Rfl:  .  doxazosin (CARDURA) 2 MG tablet, Take 1 tablet (2 mg total) by mouth at bedtime. Needs OV, Disp: 90 tablet, Rfl: 2 .  finasteride (PROSCAR) 5 MG tablet, Take 1 tablet (5 mg total) by mouth daily., Disp: 90 tablet, Rfl: 2 .  fluticasone (FLONASE) 50 MCG/ACT nasal spray, Place 2 sprays into both nostrils daily., Disp: 16 g, Rfl: 5 .  niacin (NIASPAN) 1000 MG CR tablet, Take 1 tablet (1,000 mg total) by mouth at  bedtime., Disp: 90 tablet, Rfl: 2 .  valACYclovir (VALTREX) 1000 MG tablet, Take 1 tablet (1,000 mg total) by mouth daily., Disp: 90 tablet, Rfl: 2 .  Vitamin D, Ergocalciferol, (DRISDOL) 1.25 MG (50000 UNIT) CAPS capsule, Take 1 capsule (50,000 Units total) by mouth every 7 (seven) days., Disp: 12 capsule, Rfl: 2   Allergies  Allergen Reactions  . Poison Ivy Extract    Past Medical History:  Diagnosis Date  . Actinic keratosis   . Allergy    Year Round   . Arthritis   . BPH (benign prostatic hyperplasia)   . Cancer ([place])    skin cancer - removed several times  . Cataract 2015   Right Eye   . ED (erectile dysfunction)   . Hyperlipidemia     Past Surgical History:  Procedure Laterality Date  . CERVICAL SPINE SURGERY    . COLONOSCOPY  2009   tics only  . EYE SURGERY Right    cataract   . right shoulder    . skin cancer removal     right ear x 2    Social History   Socioeconomic History  . Marital status: Married    Spouse name: [name]  . Number of children: 6  . Years of education: Some college  . Highest education level: Not on file  Occupational History  . 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 Smoking status: Never Smoker  . Smokeless tobacco: Never Used  Substance and Sexual Activity  . Alcohol use: No  . Drug use: No  . Sexual activity: Yes    Birth control/protection: None  Other Topics Concern  . Not on file  Social History Narrative   Lives with wife.     His sister in law just moved in- apartment area in garage     He has six step children, 1 passed aware -prostate cancer age 16      Caffeine use: 2 cups coffee per day    Right handed    Social Determinants of Health   Financial Resource Strain:   . Difficulty of Paying Living Expenses: Not on file  Food Insecurity:   . Worried About Charity fundraiser in the Last Year: Not on file  . Ran Out of Food in the Last Year: Not on file  Transportation  Needs:   . Lack of Transportation (Medical): Not on file  . Lack of Transportation (Non-Medical): Not on file  Physical Activity:   . Days of Exercise per Week: Not on file  . Minutes of Exercise per Session: Not on file  Stress:   . Feeling of Stress : Not on file  Social Connections:   . Frequency of Communication with Friends and Family: Not on file  . Frequency of Social Gatherings with Friends and Family: Not on file  . Attends Religious Services: Not on file  . Active Member of Clubs or Organizations: Not on file  . Attends Club  or Organization Meetings: Not on file  . Marital Status: Not on file  Intimate Partner Violence:   . Fear of Current or Ex-Partner: Not on file  . Emotionally Abused: Not on file  . Physically Abused: Not on file  . Sexually Abused: Not on file        Objective:    BP (!) 147/83   Pulse 74   Temp 98 F (36.7 C) (Temporal)   Ht 5' 10" (1.778 m)   Wt 213 lb 12.8 oz (97 kg)   SpO2 96%   BMI 30.68 kg/m   Physical Exam Vitals reviewed.  Constitutional:      General: He is not in acute distress.    Appearance: Normal appearance. He is obese. He is not ill-appearing, toxic-appearing or diaphoretic.  HENT:     Head: Normocephalic and atraumatic.     Right Ear: Tympanic membrane, ear canal and external ear normal. There is no impacted cerumen.     Left Ear: Tympanic membrane, ear canal and external ear normal. There is no impacted cerumen.     Nose: Nose normal. No congestion or rhinorrhea.     Mouth/Throat:     Mouth: Mucous membranes are moist.     Pharynx: Oropharynx is clear. No oropharyngeal exudate or posterior oropharyngeal erythema.  Eyes:     General: No scleral icterus.       Right eye: No discharge.        Left eye: No discharge.     Conjunctiva/sclera: Conjunctivae normal.     Pupils: Pupils are equal, round, and reactive to light.  Neck:     Vascular: No carotid bruit.  Cardiovascular:     Rate and Rhythm: Normal rate and  regular rhythm.     Heart sounds: Normal heart sounds. No murmur. No friction rub. No gallop.   Pulmonary:     Effort: Pulmonary effort is normal. No respiratory distress.     Breath sounds: Normal breath sounds. No stridor. No wheezing, rhonchi or rales.  Abdominal:     General: Abdomen is flat. Bowel sounds are normal. There is no distension.     Palpations: Abdomen is soft. There is no hepatomegaly, splenomegaly or mass.     Tenderness: There is no abdominal tenderness. There is no guarding or rebound.     Hernia: No hernia is present.  Musculoskeletal:        General: Normal range of motion.     Cervical back: Normal range of motion and neck supple. No rigidity. No muscular tenderness.     Right lower leg: No edema.     Left lower leg: No edema.  Lymphadenopathy:     Cervical: No cervical adenopathy.  Skin:    General: Skin is warm and dry.     Capillary Refill: Capillary refill takes less than 2 seconds.  Neurological:     General: No focal deficit present.     Mental Status: He is alert and oriented to person, place, and time. Mental status is at baseline.  Psychiatric:        Mood and Affect: Mood normal.        Behavior: Behavior normal.        Thought Content: Thought content normal.        Judgment: Judgment normal.     Lab Results  Component Value Date   TSH 3.950 03/08/2019   Lab Results  Component Value Date   WBC 7.4 03/08/2019   HGB 14.6 03/08/2019  HCT 44.4 03/08/2019   MCV 91 03/08/2019   PLT 219 03/08/2019   Lab Results  Component Value Date   NA 143 03/08/2019   K 4.2 03/08/2019   CO2 24 03/08/2019   GLUCOSE 89 03/08/2019   BUN 24 03/08/2019   CREATININE 0.90 03/08/2019   BILITOT 0.8 03/08/2019   ALKPHOS 69 03/08/2019   AST 18 03/08/2019   ALT 23 03/08/2019   PROT 6.6 03/08/2019   ALBUMIN 3.9 03/08/2019   CALCIUM 8.9 03/08/2019   Lab Results  Component Value Date   CHOL 139 03/08/2019   Lab Results  Component Value Date   HDL 44  03/08/2019   Lab Results  Component Value Date   LDLCALC 76 03/08/2019   Lab Results  Component Value Date   TRIG 95 03/08/2019   Lab Results  Component Value Date   CHOLHDL 3.2 03/08/2019   No results found for: HGBA1C

## 2019-11-15 ENCOUNTER — Other Ambulatory Visit: Payer: Self-pay

## 2019-11-16 ENCOUNTER — Ambulatory Visit (INDEPENDENT_AMBULATORY_CARE_PROVIDER_SITE_OTHER): Payer: Medicare Other | Admitting: Family Medicine

## 2019-11-16 ENCOUNTER — Encounter: Payer: Self-pay | Admitting: Family Medicine

## 2019-11-16 ENCOUNTER — Ambulatory Visit: Payer: Medicare Other | Admitting: Family Medicine

## 2019-11-16 VITALS — BP 147/83 | HR 74 | Temp 98.0°F | Ht 70.0 in | Wt 213.8 lb

## 2019-11-16 DIAGNOSIS — A6000 Herpesviral infection of urogenital system, unspecified: Secondary | ICD-10-CM

## 2019-11-16 DIAGNOSIS — N400 Enlarged prostate without lower urinary tract symptoms: Secondary | ICD-10-CM | POA: Diagnosis not present

## 2019-11-16 DIAGNOSIS — Z23 Encounter for immunization: Secondary | ICD-10-CM

## 2019-11-16 DIAGNOSIS — J302 Other seasonal allergic rhinitis: Secondary | ICD-10-CM | POA: Diagnosis not present

## 2019-11-16 DIAGNOSIS — E559 Vitamin D deficiency, unspecified: Secondary | ICD-10-CM

## 2019-11-16 DIAGNOSIS — E782 Mixed hyperlipidemia: Secondary | ICD-10-CM | POA: Diagnosis not present

## 2019-11-16 MED ORDER — CETIRIZINE HCL 10 MG PO TABS: 10.0000 mg | ORAL_TABLET | Freq: Every day | ORAL | 2 refills | Status: DC

## 2019-11-16 MED ORDER — FLUTICASONE PROPIONATE 50 MCG/ACT NA SUSP: 2.0000 | Freq: Every day | NASAL | 5 refills | Status: DC

## 2019-11-16 MED ORDER — FINASTERIDE 5 MG PO TABS: 5.0000 mg | ORAL_TABLET | Freq: Every day | ORAL | 2 refills | Status: DC

## 2019-11-16 MED ORDER — ATORVASTATIN CALCIUM 20 MG PO TABS: 20.0000 mg | ORAL_TABLET | Freq: Every day | ORAL | 2 refills | Status: DC

## 2019-11-16 MED ORDER — VALACYCLOVIR HCL 1 G PO TABS: 1000.0000 mg | ORAL_TABLET | Freq: Every day | ORAL | 2 refills | Status: DC

## 2019-11-16 MED ORDER — DOXAZOSIN MESYLATE 2 MG PO TABS: 2.0000 mg | ORAL_TABLET | Freq: Every day | ORAL | 2 refills | Status: DC

## 2019-11-16 MED ORDER — VITAMIN D (ERGOCALCIFEROL) 1.25 MG (50000 UNIT) PO CAPS: 50000.0000 [IU] | ORAL_CAPSULE | ORAL | 2 refills | Status: DC

## 2019-11-16 MED ORDER — NIACIN ER (ANTIHYPERLIPIDEMIC) 1000 MG PO TBCR: 1000.0000 mg | EXTENDED_RELEASE_TABLET | Freq: Every day | ORAL | 2 refills | Status: DC

## 2019-11-16 NOTE — Patient Instructions (Addendum)
Last colonoscopy January 25, 2015 - due this year!   Preventive Care 33 Years and Older, Male Preventive care refers to lifestyle choices and visits with your health care provider that can promote health and wellness. This includes:  A yearly physical exam. This is also called an annual well check.  Regular dental and eye exams.  Immunizations.  Screening for certain conditions.  Healthy lifestyle choices, such as diet and exercise. What can I expect for my preventive care visit? Physical exam Your health care provider will check:  Height and weight. These may be used to calculate body mass index (BMI), which is a measurement that tells if you are at a healthy weight.  Heart rate and blood pressure.  Your skin for abnormal spots. Counseling Your health care provider may ask you questions about:  Alcohol, tobacco, and drug use.  Emotional well-being.  Home and relationship well-being.  Sexual activity.  Eating habits.  History of falls.  Memory and ability to understand (cognition).  Work and work Statistician. What immunizations do I need?  Influenza (flu) vaccine  This is recommended every year. Tetanus, diphtheria, and pertussis (Tdap) vaccine  You may need a Td booster every 10 years. Varicella (chickenpox) vaccine  You may need this vaccine if you have not already been vaccinated. Zoster (shingles) vaccine  You may need this after age 77. Pneumococcal conjugate (PCV13) vaccine  One dose is recommended after age 60. Pneumococcal polysaccharide (PPSV23) vaccine  One dose is recommended after age 73. Measles, mumps, and rubella (MMR) vaccine  You may need at least one dose of MMR if you were born in 1957 or later. You may also need a second dose. Meningococcal conjugate (MenACWY) vaccine  You may need this if you have certain conditions. Hepatitis A vaccine  You may need this if you have certain conditions or if you travel or work in places where  you may be exposed to hepatitis A. Hepatitis B vaccine  You may need this if you have certain conditions or if you travel or work in places where you may be exposed to hepatitis B. Haemophilus influenzae type b (Hib) vaccine  You may need this if you have certain conditions. You may receive vaccines as individual doses or as more than one vaccine together in one shot (combination vaccines). Talk with your health care provider about the risks and benefits of combination vaccines. What tests do I need? Blood tests  Lipid and cholesterol levels. These may be checked every 5 years, or more frequently depending on your overall health.  Hepatitis C test.  Hepatitis B test. Screening  Lung cancer screening. You may have this screening every year starting at age 73 if you have a 30-pack-year history of smoking and currently smoke or have quit within the past 15 years.  Colorectal cancer screening. All adults should have this screening starting at age 64 and continuing until age 82. Your health care provider may recommend screening at age 74 if you are at increased risk. You will have tests every 1-10 years, depending on your results and the type of screening test.  Prostate cancer screening. Recommendations will vary depending on your family history and other risks.  Diabetes screening. This is done by checking your blood sugar (glucose) after you have not eaten for a while (fasting). You may have this done every 1-3 years.  Abdominal aortic aneurysm (AAA) screening. You may need this if you are a current or former smoker.  Sexually transmitted disease (STD)  testing. Follow these instructions at home: Eating and drinking  Eat a diet that includes fresh fruits and vegetables, whole grains, lean protein, and low-fat dairy products. Limit your intake of foods with high amounts of sugar, saturated fats, and salt.  Take vitamin and mineral supplements as recommended by your health care  provider.  Do not drink alcohol if your health care provider tells you not to drink.  If you drink alcohol: ? Limit how much you have to 0-2 drinks a day. ? Be aware of how much alcohol is in your drink. In the [place] one drink equals one 12 oz bottle of beer (355 mL), one 5 oz glass of wine (148 mL), or one 1 oz glass of hard liquor (44 mL). Lifestyle  Take daily care of your teeth and gums.  Stay active. Exercise for at least 30 minutes on 5 or more days each week.  Do not use any products that contain nicotine or tobacco, such as cigarettes, e-cigarettes, and chewing tobacco. If you need help quitting, ask your health care provider.  If you are sexually active, practice safe sex. Use a condom or other form of protection to prevent STIs (sexually transmitted infections).  Talk with your health care provider about taking a low-dose aspirin or statin. What's next?  Visit your health care provider once a year for a well check visit.  Ask your health care provider how often you should have your eyes and teeth checked.  Stay up to date on all vaccines. This information is not intended to replace advice given to you by your health care provider. Make sure you discuss any questions you have with your health care provider. Document Revised: 09/16/2018 Document Reviewed: 09/16/2018 Elsevier Patient Education  2020 [employer].

## 2019-11-17 LAB — LIPID PANEL
Chol/HDL Ratio: 3 ratio (ref 0.0–5.0)
Cholesterol, Total: 133 mg/dL (ref 100–199)
HDL: 44 mg/dL (ref >39)
LDL Chol Calc (NIH): 68 mg/dL (ref 0–99)
Triglycerides: 116 mg/dL (ref 0–149)
VLDL Cholesterol Cal: 21 mg/dL (ref 5–40)

## 2019-11-17 LAB — CMP14+EGFR
ALT: 20 IU/L (ref 0–44)
AST: 18 IU/L (ref 0–40)
Albumin/Globulin Ratio: 1.7 (ref 1.2–2.2)
Albumin: 4.1 g/dL (ref 3.7–4.7)
Alkaline Phosphatase: 80 IU/L (ref 39–117)
BUN/Creatinine Ratio: 17 (ref 10–24)
BUN: 17 mg/dL (ref 8–27)
Bilirubin Total: 0.5 mg/dL (ref 0.0–1.2)
CO2: 25 mmol/L (ref 20–29)
Calcium: 9.1 mg/dL (ref 8.6–10.2)
Chloride: 103 mmol/L (ref 96–106)
Creatinine, Ser: 0.99 mg/dL (ref 0.76–1.27)
GFR calc Af Amer: 88 mL/min/{1.73_m2} (ref >59)
GFR calc non Af Amer: 76 mL/min/{1.73_m2} (ref >59)
Globulin, Total: 2.4 g/dL (ref 1.5–4.5)
Glucose: 95 mg/dL (ref 65–99)
Potassium: 4.2 mmol/L (ref 3.5–5.2)
Sodium: 141 mmol/L (ref 134–144)
Total Protein: 6.5 g/dL (ref 6.0–8.5)

## 2019-12-07 ENCOUNTER — Other Ambulatory Visit: Payer: Self-pay

## 2019-12-07 ENCOUNTER — Ambulatory Visit (INDEPENDENT_AMBULATORY_CARE_PROVIDER_SITE_OTHER): Payer: Medicare Other

## 2019-12-07 DIAGNOSIS — Z0000 Encounter for general adult medical examination without abnormal findings: Secondary | ICD-10-CM | POA: Diagnosis not present

## 2019-12-07 NOTE — Progress Notes (Signed)
MEDICARE ANNUAL WELLNESS VISIT  12/07/2019  Telephone Visit Disclaimer This Medicare AWV was conducted by telephone due to national recommendations for restrictions regarding the COVID-19 Pandemic (e.g. social distancing).  I verified, using two identifiers, that I am speaking with Samuel Wheeler or their authorized healthcare agent. I discussed the limitations, risks, security, and privacy concerns of performing an evaluation and management service by telephone and the potential availability of an in-person appointment in the future. The patient expressed understanding and agreed to proceed.   Subjective:  Samuel Wheeler is a 74 y.o. male patient of Samuel Brooklyn, FNP who had a Medicare Annual Wellness Visit today via telephone. Samuel Wheeler is Retired and lives with their spouse. he has 6 step children. he reports that he is not socially active and does interact with friends/family regularly. he is minimally physically active and enjoys gardening/ yard work.  Patient Care Team: Samuel Brooklyn, FNP as PCP - General (Family Medicine) Samuel Breeding, MD as Consulting Physician (Cardiology) Ladene Artist, MD as Consulting Physician (Gastroenterology) Samuel Craze, MD as Referring Physician (Unknown Physician Specialty) Samuel Maudlin, MD as Consulting Physician (Orthopedic Surgery) Samuel Labs, MD as Referring Physician (Optometry)  Advanced Directives 12/03/2018 01/28/2017 12/16/2016 01/25/2015 01/22/2015 01/02/2015  Does Patient Have a Medical Advance Directive? Yes Yes No Yes Yes Yes  Type of Advance Directive Living will Living will - - [place];Living will Living will;Healthcare Power of Attorney  Does patient want to make changes to medical advance directive? - - - - - No - Patient declined  Copy of [place] in Chart? - - - No - copy requested - No - copy requested    Hospital Utilization Over the Past 12 Months: # of  hospitalizations or ER visits: 0 # of surgeries: 1  Review of Systems    Patient reports that his overall health is unchanged compared to last year.    Patient Reported Readings (BP 123/82 Pulse 60. Taken about one week ago at patient's home.  Pain Assessment       Current Medications & Allergies (verified) Allergies as of 12/07/2019      Reactions   Poison Ivy Extract       Medication List       Accurate as of December 07, 2019 10:18 AM. If you have any questions, ask your nurse or doctor.        albuterol 108 (90 Base) MCG/ACT inhaler Commonly known as: ProAir HFA USE 2 INHALATIONS EVERY 6 HOURS AS NEEDED FOR WHEEZING OR SHORTNESS OF BREATH   aspirin 81 MG chewable tablet Chew 81 mg by mouth daily.   atorvastatin 20 MG tablet Commonly known as: LIPITOR Take 1 tablet (20 mg total) by mouth daily.   cetirizine 10 MG tablet Commonly known as: ZYRTEC Take 1 tablet (10 mg total) by mouth daily.   doxazosin 2 MG tablet Commonly known as: CARDURA Take 1 tablet (2 mg total) by mouth at bedtime. Needs OV   DRAMAMINE PO Take 1 Dose by mouth as needed.   finasteride 5 MG tablet Commonly known as: PROSCAR Take 1 tablet (5 mg total) by mouth daily.   fluticasone 50 MCG/ACT nasal spray Commonly known as: FLONASE Place 2 sprays into both nostrils daily.   niacin 1000 MG CR tablet Commonly known as: Niaspan Take 1 tablet (1,000 mg total) by mouth at bedtime.   valACYclovir 1000 MG tablet Commonly known as: VALTREX Take 1  tablet (1,000 mg total) by mouth daily.   Vitamin D (Ergocalciferol) 1.25 MG (50000 UNIT) Caps capsule Commonly known as: DRISDOL Take 1 capsule (50,000 Units total) by mouth every 7 (seven) days.       History (reviewed): Past Medical History:  Diagnosis Date  . Actinic keratosis   . Allergy    Year Round   . Arthritis   . BPH (benign prostatic hyperplasia)   . Cancer ([place])    skin cancer - removed several times  . Cataract 2015    Right Eye   . ED (erectile dysfunction)   . Hyperlipidemia    Past Surgical History:  Procedure Laterality Date  . CERVICAL SPINE SURGERY    . COLONOSCOPY  2009   tics only  . EYE SURGERY Right    cataract   . right shoulder    . skin cancer removal     right ear x 2   Family History  Problem Relation Age of Onset  . Stroke Mother 4  . Stroke Father 45  . Diabetes Father   . COPD Father   . Heart disease Sister        "Mild heart attack", pacemaker  . Stroke Brother 89  . Syncope episode Brother   . Parkinson's disease Sister   . Stroke Sister   . Heart disease Sister   . Cancer Sister 17       Colon Cancer  . Cancer Sister        Skin Cancers  . Cancer Sister        Melanoma  . Cancer Sister        Skin Cancer  . Cancer Maternal Aunt        bone   Social History   Socioeconomic History  . Marital status: Married    Spouse name: Samuel Wheeler  . Number of children: 6  . Years of education: Some college  . Highest education level: Not on file  Occupational History  . 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 Smoking status: Never Smoker  . Smokeless tobacco: Never Used  Substance and Sexual Activity  . Alcohol use: No  . Drug use: No  . Sexual activity: Yes    Birth control/protection: None  Other Topics Concern  . Not on file  Social History Narrative   Lives with wife.     His sister in law just moved in- apartment area in garage     He has six step children, 1 passed aware -prostate cancer age 21      Caffeine use: 2 cups coffee per day    Right handed    Social Determinants of Health   Financial Resource Strain:   . Difficulty of Paying Living Expenses: Not on file  Food Insecurity:   . Worried About Charity fundraiser in the Last Year: Not on file  . Ran Out of Food in the Last Year: Not on file  Transportation Needs:   . Lack of Transportation (Medical): Not on file  . Lack of Transportation  (Non-Medical): Not on file  Physical Activity:   . Days of Exercise per Week: Not on file  . Minutes of Exercise per Session: Not on file  Stress:   . Feeling of Stress : Not on file  Social Connections:   . Frequency of Communication with Friends and Family: Not on file  . Frequency of Social Gatherings with Friends  and Family: Not on file  . Attends Religious Services: Not on file  . Active Member of Clubs or Organizations: Not on file  . Attends Archivist Meetings: Not on file  . Marital Status: Not on file    Activities of Daily Living No flowsheet data found.  Patient Education/ Literacy    Exercise    Diet Patient reports consuming 3 meals a day and 3 snack(s) a day Patient reports that his primary diet is: Regular Patient reports that she does have regular access to food.   Depression Screen PHQ 2/9 Scores 11/16/2019 12/03/2018 09/20/2018 04/14/2018 03/10/2018 02/24/2018 08/25/2017  PHQ - 2 Score 0 0 0 0 0 0 0     Fall Risk Fall Risk  11/16/2019 12/03/2018 09/20/2018 04/14/2018 03/10/2018  Falls in the past year? 0 0 0 No No  Number falls in past yr: - - - - -  Injury with Fall? - - - - -  Comment - - - - -     Objective:  Samuel Wheeler seemed alert and oriented and he participated appropriately during our telephone visit.  Blood Pressure Weight BMI  BP Readings from Last 3 Encounters:  11/16/19 (!) 147/83  12/03/18 125/66  09/20/18 125/80   Wt Readings from Last 3 Encounters:  11/16/19 213 lb 12.8 oz (97 kg)  12/03/18 208 lb (94.3 kg)  09/20/18 204 lb (92.5 kg)   BMI Readings from Last 1 Encounters:  11/16/19 30.68 kg/m    *Unable to obtain current vital signs, weight, and BMI due to telephone visit type  Hearing/Vision  . Tery did not seem to have difficulty with hearing/understanding during the telephone conversation . Reports that he has not had a formal eye exam by an eye care professional within the past year . Reports that he has not had  a formal hearing evaluation within the past year *Unable to fully assess hearing and vision during telephone visit type  Cognitive Function: No flowsheet data found. (Normal:0-7, Significant for Dysfunction: >8)  Normal Cognitive Function Screening: Yes   Immunization & Health Maintenance Record Immunization History  Administered Date(s) Administered  . Fluad Quad(high Dose 65+) 06/21/2019  . Influenza, High Dose Seasonal PF 07/20/2017, 06/30/2018  . Influenza,inj,Quad PF,6+ Mos 08/03/2013, 07/10/2014, 07/09/2015, 07/08/2016  . Moderna SARS-COVID-2 Vaccination 11/11/2019  . Pneumococcal Conjugate-13 07/20/2014  . Pneumococcal Polysaccharide-23 07/06/2012  . Tdap 06/11/2009, 11/16/2019  . Zoster 01/27/2013  . Zoster Recombinat (Shingrix) 03/04/2017, 08/25/2017    Health Maintenance  Topic Date Due  . COLONOSCOPY  01/25/2020  . TETANUS/TDAP  11/15/2029  . INFLUENZA VACCINE  Completed  . Hepatitis C Screening  Completed  . PNA vac Low Risk Adult  Completed       Assessment  This is a routine wellness examination for Samuel Wheeler.  Health Maintenance: Due or Overdue There are no preventive care reminders to display for this patient.  Samuel Wheeler does not need a referral for Community Assistance: Care Management:   no Social Work:    no Prescription Assistance:  no Nutrition/Diabetes Education:  no   Plan:  Personalized Goals  Increase physical activity.   Personalized Health Maintenance & Screening Recommendations   Health Maintenance is up to date  Lung Cancer Screening Recommended: no (Low Dose CT Chest recommended if Age 30-80 years, 30 pack-year currently smoking OR have quit w/in past 15 years) Hepatitis C Screening recommended: no HIV Screening recommended: no  Advanced Directives: Written information was not prepared  per patient's request.  Referrals & Orders No orders of the defined types were placed in this encounter.   Follow-up  Plan . Follow-up with Samuel Brooklyn, FNP as planned . Scheduled 05/16/2020 for a 6 month follow up.    I have personally reviewed and noted the following in the patient's chart:   . Medical and social history . Use of alcohol, tobacco or illicit drugs  . Current medications and supplements . Functional ability and status . Nutritional status . Physical activity . Advanced directives . List of other physicians . Hospitalizations, surgeries, and ER visits in previous 12 months . Vitals . Screenings to include cognitive, depression, and falls . Referrals and appointments  In addition, I have reviewed and discussed with Samuel Wheeler certain preventive protocols, quality metrics, and best practice recommendations. A written personalized care plan for preventive services as well as general preventive health recommendations is available and can be mailed to the patient at his request.      Alphonzo Dublin, LPN [phone]

## 2020-01-02 ENCOUNTER — Telehealth: Payer: Self-pay | Admitting: Family Medicine

## 2020-01-02 DIAGNOSIS — J302 Other seasonal allergic rhinitis: Secondary | ICD-10-CM

## 2020-01-02 MED ORDER — FLUTICASONE PROPIONATE 50 MCG/ACT NA SUSP: 2.0000 | Freq: Every day | NASAL | 1 refills | Status: DC

## 2020-01-02 NOTE — Telephone Encounter (Signed)
Pt aware Rx resent to pharmacy for 3 mos supply

## 2020-01-25 ENCOUNTER — Other Ambulatory Visit: Payer: Self-pay

## 2020-01-25 ENCOUNTER — Ambulatory Visit (AMBULATORY_SURGERY_CENTER): Payer: Self-pay

## 2020-01-25 VITALS — Temp 97.3°F | Ht 71.0 in | Wt 210.0 lb

## 2020-01-25 DIAGNOSIS — Z86010 Personal history of colonic polyps: Secondary | ICD-10-CM

## 2020-01-25 DIAGNOSIS — Z800 Family history of malignant neoplasm of digestive organs: Secondary | ICD-10-CM

## 2020-01-25 MED ORDER — SUTAB 1479-225-188 MG PO TABS: 12.0000 | ORAL_TABLET | ORAL | 0 refills | Status: DC

## 2020-01-25 NOTE — Progress Notes (Signed)
No egg or soy allergy known to patient  No issues with past sedation with any surgeries  or procedures, no intubation problems  No diet pills per patient No home 02 use per patient  No blood thinners per patient  Pt denies issues with constipation  No A fib or A flutter  EMMI video sent to pt's e mail   Completed covid vaccine series   SuTab Sample given, Lot#[id number], EXP03/2022  Due to the COVID-19 pandemic we are asking patients to follow these guidelines. Please only bring one care partner. Please be aware that your care partner may wait in the car in the parking lot or if they feel like they will be too hot to wait in the car, they may wait in the lobby on the 4th floor. All care partners are required to wear a mask the entire time (we do not have any that we can provide them), they need to practice social distancing, and we will do a Covid check for all patient's and care partners when you arrive. Also we will check their temperature and your temperature. If the care partner waits in their car they need to stay in the parking lot the entire time and we will call them on their cell phone when the patient is ready for discharge so they can bring the car to the front of the building. Also all patient's will need to wear a mask into building.

## 2020-02-08 ENCOUNTER — Encounter: Payer: Self-pay | Admitting: Gastroenterology

## 2020-02-08 ENCOUNTER — Other Ambulatory Visit: Payer: Self-pay

## 2020-02-08 ENCOUNTER — Ambulatory Visit (AMBULATORY_SURGERY_CENTER): Payer: Medicare Other | Admitting: Gastroenterology

## 2020-02-08 VITALS — BP 118/67 | HR 50 | Temp 96.9°F | Resp 12 | Ht 70.0 in | Wt 210.0 lb

## 2020-02-08 DIAGNOSIS — Z800 Family history of malignant neoplasm of digestive organs: Secondary | ICD-10-CM | POA: Diagnosis not present

## 2020-02-08 DIAGNOSIS — D124 Benign neoplasm of descending colon: Secondary | ICD-10-CM

## 2020-02-08 DIAGNOSIS — Z86010 Personal history of colonic polyps: Secondary | ICD-10-CM | POA: Diagnosis not present

## 2020-02-08 DIAGNOSIS — D122 Benign neoplasm of ascending colon: Secondary | ICD-10-CM

## 2020-02-08 MED ORDER — SODIUM CHLORIDE 0.9 % IV SOLN
500.0000 mL | Freq: Once | INTRAVENOUS | Status: DC
Start: 2020-02-08 — End: 2020-02-08

## 2020-02-08 NOTE — Progress Notes (Signed)
Report to PACU, RN, vss, BBS= Clear.  

## 2020-02-08 NOTE — Progress Notes (Signed)
Pt's states no medical or surgical changes since previsit or office visit.  Temp- June Vitals- Courtney 

## 2020-02-08 NOTE — Op Note (Signed)
[facility] Patient Name: Samuel Wheeler Procedure Date: 02/08/2020 11:39 AM MRN: [id number] Endoscopist: Ladene Artist , MD Age: 74 Referring MD:  Date of Birth: 1946/01/17 Gender: Male Account #: [id number] Procedure:                Colonoscopy Indications:              Surveillance: Personal history of adenomatous                            polyps on last colonoscopy 5 years ago. Family                            history of colon cancer, first degree relative. Medicines:                Monitored Anesthesia Care Procedure:                Pre-Anesthesia Assessment:                           - Prior to the procedure, a History and Physical                            was performed, and patient medications and                            allergies were reviewed. The patient's tolerance of                            previous anesthesia was also reviewed. The risks                            and benefits of the procedure and the sedation                            options and risks were discussed with the patient.                            All questions were answered, and informed consent                            was obtained. Prior Anticoagulants: The patient has                            taken no previous anticoagulant or antiplatelet                            agents. ASA Grade Assessment: II - A patient with                            mild systemic disease. After reviewing the risks                            and benefits, the patient was deemed in  satisfactory condition to undergo the procedure.                           After obtaining informed consent, the colonoscope                            was passed under direct vision. Throughout the                            procedure, the patient's blood pressure, pulse, and                            oxygen saturations were monitored continuously. The                            Colonoscope was  introduced through the anus and                            advanced to the the cecum, identified by                            appendiceal orifice and ileocecal valve. The                            ileocecal valve, appendiceal orifice, and rectum                            were photographed. The quality of the bowel                            preparation was good. The colonoscopy was performed                            without difficulty. The patient tolerated the                            procedure well. Scope In: 11:50:26 AM Scope Out: 12:08:52 PM Scope Withdrawal Time: 0 hours 13 minutes 19 seconds  Total Procedure Duration: 0 hours 18 minutes 26 seconds  Findings:                 The perianal and digital rectal examinations were                            normal.                           Three sessile polyps were found in the descending                            colon and ascending colon. The polyps were 6 to 7                            mm in size. These polyps were removed with a cold  snare. Resection and retrieval were complete.                           Multiple medium-mouthed diverticula were found in                            the left colon. There was narrowing of the colon in                            association with the diverticular opening. There                            was evidence of diverticular spasm. There was                            evidence of an impacted diverticulum. There was no                            evidence of diverticular bleeding.                           Internal hemorrhoids were found during                            retroflexion. The hemorrhoids were small and Grade                            I (internal hemorrhoids that do not prolapse).                           The exam was otherwise without abnormality on                            direct and retroflexion views. Complications:            No immediate  complications. Estimated blood loss:                            None. Estimated Blood Loss:     Estimated blood loss: none. Impression:               - Three 6 to 7 mm polyps in the descending colon                            and in the ascending colon, removed with a cold                            snare. Resected and retrieved.                           - Moderate diverticulosis in the left colon.                           - Internal hemorrhoids.                           -  The examination was otherwise normal on direct                            and retroflexion views. Recommendation:           - Repeat colonoscopy after studies are complete for                            surveillance based on pathology results.                           - Patient has a contact number available for                            emergencies. The signs and symptoms of potential                            delayed complications were discussed with the                            patient. Return to normal activities tomorrow.                            Written discharge instructions were provided to the                            patient.                           - High fiber diet.                           - Continue present medications.                           - Await pathology results. Ladene Artist, MD 02/08/2020 12:14:12 PM This report has been signed electronically.

## 2020-02-08 NOTE — Patient Instructions (Signed)
YOU HAD AN ENDOSCOPIC PROCEDURE TODAY AT THE [facility]:   Refer to the procedure report that was given to you for any specific questions about what was found during the examination.  If the procedure report does not answer your questions, please call your gastroenterologist to clarify.  If you requested that your care partner not be given the details of your procedure findings, then the procedure report has been included in a sealed envelope for you to review at your convenience later.  YOU SHOULD EXPECT: Some feelings of bloating in the abdomen. Passage of more gas than usual.  Walking can help get rid of the air that was put into your GI tract during the procedure and reduce the bloating. If you had a lower endoscopy (such as a colonoscopy or flexible sigmoidoscopy) you may notice spotting of blood in your stool or on the toilet paper. If you underwent a bowel prep for your procedure, you may not have a normal bowel movement for a few days.  Please Note:  You might notice some irritation and congestion in your nose or some drainage.  This is from the oxygen used during your procedure.  There is no need for concern and it should clear up in a day or so.  SYMPTOMS TO REPORT IMMEDIATELY:   Following lower endoscopy (colonoscopy or flexible sigmoidoscopy):  Excessive amounts of blood in the stool  Significant tenderness or worsening of abdominal pains  Swelling of the abdomen that is new, acute  Fever of 100F or higher  For urgent or emergent issues, a gastroenterologist can be reached at any hour by calling [phone]. Do not use MyChart messaging for urgent concerns.    DIET:  We do recommend a small meal at first, but then you may proceed to your regular diet.  Drink plenty of fluids but you should avoid alcoholic beverages for 24 hours.  ACTIVITY:  You should plan to take it easy for the rest of today and you should NOT DRIVE or use heavy machinery until tomorrow (because  of the sedation medicines used during the test).    FOLLOW UP: Our staff will call the number listed on your records 48-72 hours following your procedure to check on you and address any questions or concerns that you may have regarding the information given to you following your procedure. If we do not reach you, we will leave a message.  We will attempt to reach you two times.  During this call, we will ask if you have developed any symptoms of COVID 19. If you develop any symptoms (ie: fever, flu-like symptoms, shortness of breath, cough etc.) before then, please call [phone].  If you test positive for Covid 19 in the 2 weeks post procedure, please call and report this information to us.    If any biopsies were taken you will be contacted by phone or by letter within the next 1-3 weeks.  Please call us at [phone] if you have not heard about the biopsies in 3 weeks.    SIGNATURES/CONFIDENTIALITY: You and/or your care partner have signed paperwork which will be entered into your electronic medical record.  These signatures attest to the fact that that the information above on your After Visit Summary has been reviewed and is understood.  Full responsibility of the confidentiality of this discharge information lies with you and/or your care-partner. 

## 2020-02-08 NOTE — Progress Notes (Signed)
Called to room to assist during endoscopic procedure.  Patient ID and intended procedure confirmed with present staff. Received instructions for my participation in the procedure from the performing physician.  

## 2020-02-10 ENCOUNTER — Telehealth: Payer: Self-pay

## 2020-02-10 NOTE — Telephone Encounter (Signed)
No answer, left message to call back later today, B.Kadie Balestrieri RN. 

## 2020-02-10 NOTE — Telephone Encounter (Signed)
  Follow up Call-  Call back number 02/08/2020  Post procedure Call Back phone  # [phone]  Permission to leave phone message Yes  Some recent data might be hidden     Patient questions:  Do you have a fever, pain , or abdominal swelling? No. Pain Score  0 *  Have you tolerated food without any problems? Yes.    Have you been able to return to your normal activities? Yes.    Do you have any questions about your discharge instructions: Diet   No. Medications  No. Follow up visit  No.  Do you have questions or concerns about your Care? No.  Actions: * If pain score is 4 or above: No action needed, pain <4. 1. Have you developed a fever since your procedure? no  2.   Have you had an respiratory symptoms (SOB or cough) since your procedure? no  3.   Have you tested positive for COVID 19 since your procedure no  4.   Have you had any family members/close contacts diagnosed with the COVID 19 since your procedure?  no   If yes to any of these questions please route to Joylene John, RN and Erenest Rasher, RN

## 2020-02-16 ENCOUNTER — Encounter: Payer: Self-pay | Admitting: Gastroenterology

## 2020-04-16 ENCOUNTER — Other Ambulatory Visit: Payer: Self-pay | Admitting: Family Medicine

## 2020-05-16 ENCOUNTER — Ambulatory Visit (INDEPENDENT_AMBULATORY_CARE_PROVIDER_SITE_OTHER): Payer: Medicare Other | Admitting: Family Medicine

## 2020-05-16 ENCOUNTER — Encounter: Payer: Self-pay | Admitting: Family Medicine

## 2020-05-16 ENCOUNTER — Other Ambulatory Visit: Payer: Self-pay

## 2020-05-16 VITALS — BP 137/77 | HR 67 | Temp 97.8°F | Resp 20 | Ht 70.0 in | Wt 201.0 lb

## 2020-05-16 DIAGNOSIS — N400 Enlarged prostate without lower urinary tract symptoms: Secondary | ICD-10-CM | POA: Diagnosis not present

## 2020-05-16 DIAGNOSIS — E782 Mixed hyperlipidemia: Secondary | ICD-10-CM | POA: Diagnosis not present

## 2020-05-16 DIAGNOSIS — A6000 Herpesviral infection of urogenital system, unspecified: Secondary | ICD-10-CM | POA: Diagnosis not present

## 2020-05-16 DIAGNOSIS — E559 Vitamin D deficiency, unspecified: Secondary | ICD-10-CM | POA: Diagnosis not present

## 2020-05-16 DIAGNOSIS — J302 Other seasonal allergic rhinitis: Secondary | ICD-10-CM

## 2020-05-16 NOTE — Patient Instructions (Signed)
Preventive Care 47 Years and Older, Male Preventive care refers to lifestyle choices and visits with your health care provider that can promote health and wellness. This includes:  A yearly physical exam. This is also called an annual well check.  Regular dental and eye exams.  Immunizations.  Screening for certain conditions.  Healthy lifestyle choices, such as diet and exercise. What can I expect for my preventive care visit? Physical exam Your health care provider will check:  Height and weight. These may be used to calculate body mass index (BMI), which is a measurement that tells if you are at a healthy weight.  Heart rate and blood pressure.  Your skin for abnormal spots. Counseling Your health care provider may ask you questions about:  Alcohol, tobacco, and drug use.  Emotional well-being.  Home and relationship well-being.  Sexual activity.  Eating habits.  History of falls.  Memory and ability to understand (cognition).  Work and work Statistician. What immunizations do I need?  Influenza (flu) vaccine  This is recommended every year. Tetanus, diphtheria, and pertussis (Tdap) vaccine  You may need a Td booster every 10 years. Varicella (chickenpox) vaccine  You may need this vaccine if you have not already been vaccinated. Zoster (shingles) vaccine  You may need this after age 39. Pneumococcal conjugate (PCV13) vaccine  One dose is recommended after age 1. Pneumococcal polysaccharide (PPSV23) vaccine  One dose is recommended after age 57. Measles, mumps, and rubella (MMR) vaccine  You may need at least one dose of MMR if you were born in 1957 or later. You may also need a second dose. Meningococcal conjugate (MenACWY) vaccine  You may need this if you have certain conditions. Hepatitis A vaccine  You may need this if you have certain conditions or if you travel or work in places where you may be exposed to hepatitis A. Hepatitis B  vaccine  You may need this if you have certain conditions or if you travel or work in places where you may be exposed to hepatitis B. Haemophilus influenzae type b (Hib) vaccine  You may need this if you have certain conditions. You may receive vaccines as individual doses or as more than one vaccine together in one shot (combination vaccines). Talk with your health care provider about the risks and benefits of combination vaccines. What tests do I need? Blood tests  Lipid and cholesterol levels. These may be checked every 5 years, or more frequently depending on your overall health.  Hepatitis C test.  Hepatitis B test. Screening  Lung cancer screening. You may have this screening every year starting at age 90 if you have a 30-pack-year history of smoking and currently smoke or have quit within the past 15 years.  Colorectal cancer screening. All adults should have this screening starting at age 48 and continuing until age 64. Your health care provider may recommend screening at age 74 if you are at increased risk. You will have tests every 1-10 years, depending on your results and the type of screening test.  Prostate cancer screening. Recommendations will vary depending on your family history and other risks.  Diabetes screening. This is done by checking your blood sugar (glucose) after you have not eaten for a while (fasting). You may have this done every 1-3 years.  Abdominal aortic aneurysm (AAA) screening. You may need this if you are a current or former smoker.  Sexually transmitted disease (STD) testing. Follow these instructions at home: Eating and drinking  Eat a diet that includes fresh fruits and vegetables, whole grains, lean protein, and low-fat dairy products. Limit your intake of foods with high amounts of sugar, saturated fats, and salt.  Take vitamin and mineral supplements as recommended by your health care provider.  Do not drink alcohol if your health care  provider tells you not to drink.  If you drink alcohol: ? Limit how much you have to 0-2 drinks a day. ? Be aware of how much alcohol is in your drink. In the [place] one drink equals one 12 oz bottle of beer (355 mL), one 5 oz glass of wine (148 mL), or one 1 oz glass of hard liquor (44 mL). Lifestyle  Take daily care of your teeth and gums.  Stay active. Exercise for at least 30 minutes on 5 or more days each week.  Do not use any products that contain nicotine or tobacco, such as cigarettes, e-cigarettes, and chewing tobacco. If you need help quitting, ask your health care provider.  If you are sexually active, practice safe sex. Use a condom or other form of protection to prevent STIs (sexually transmitted infections).  Talk with your health care provider about taking a low-dose aspirin or statin. What's next?  Visit your health care provider once a year for a well check visit.  Ask your health care provider how often you should have your eyes and teeth checked.  Stay up to date on all vaccines. This information is not intended to replace advice given to you by your health care provider. Make sure you discuss any questions you have with your health care provider. Document Revised: 09/16/2018 Document Reviewed: 09/16/2018 Elsevier Patient Education  2020 [employer].

## 2020-05-16 NOTE — Progress Notes (Signed)
Assessment & Plan:  1. Mixed hyperlipidemia - Well controlled on current regimen.  - CBC with Differential/Platelet - CMP14+EGFR - Lipid panel  2. Benign prostatic hyperplasia, unspecified whether lower urinary tract symptoms present - Ambulatory referral to Urology  3. Vitamin D deficiency - Well controlled on current regimen.  - VITAMIN D 25 Hydroxy (Vit-D Deficiency, Fractures)  4. Genital herpes simplex, unspecified site - Well controlled on current regimen.   5. Seasonal allergies - Well controlled on current regimen.    Return in about 6 months (around 11/16/2020) for follow-up of chronic medication conditions.  Hendricks Limes, MSN, APRN, FNP-C [facility]  Subjective:    Patient ID: Samuel Wheeler, male    DOB: [birth year], 74 y.o.   MRN: [id number]  Patient Care Team: Loman Brooklyn, FNP as PCP - General (Family Medicine) Minus Breeding, MD as Consulting Physician (Cardiology) Ladene Artist, MD as Consulting Physician (Gastroenterology) Sandford Craze, MD as Referring Physician (Unknown Physician Specialty) Latanya Maudlin, MD as Consulting Physician (Orthopedic Surgery) Harlen Labs, MD as Referring Physician (Optometry)   Chief Complaint:  Chief Complaint  Patient presents with  . Medical Management of Chronic Issues    6 mo   . Hyperlipidemia    HPI: Samuel Wheeler is a 74 y.o. male presenting on 05/16/2020 for Medical Management of Chronic Issues (6 mo ) and Hyperlipidemia  Patient requests a referral to urology due to BPH. He reports the medication does decrease how often he gets up at night, but he would like to see urology.   New complaints: Patient reports he stepped on a nail yesterday. It went through his shoe to his left foot. It is not bothering him today. He is UTD with TDAP.    Social history:  Relevant past medical, surgical, family and social history reviewed and updated as indicated. Interim medical  history since our last visit reviewed.  Allergies and medications reviewed and updated.  DATA REVIEWED: CHART IN EPIC  ROS: Negative unless specifically indicated above in HPI.    Current Outpatient Medications:  .  albuterol (PROAIR HFA) 108 (90 Base) MCG/ACT inhaler, USE 2 INHALATIONS EVERY 6 HOURS AS NEEDED FOR WHEEZING OR SHORTNESS OF BREATH, Disp: 51 g, Rfl: 3 .  aspirin 81 MG chewable tablet, Chew 81 mg by mouth daily., Disp: , Rfl:  .  atorvastatin (LIPITOR) 20 MG tablet, Take 1 tablet (20 mg total) by mouth daily., Disp: 90 tablet, Rfl: 2 .  cetirizine (ZYRTEC) 10 MG tablet, Take 1 tablet (10 mg total) by mouth daily., Disp: 90 tablet, Rfl: 2 .  DimenhyDRINATE (DRAMAMINE PO), Take 1 Dose by mouth as needed. , Disp: , Rfl:  .  doxazosin (CARDURA) 2 MG tablet, Take 1 tablet (2 mg total) by mouth at bedtime. Needs OV, Disp: 90 tablet, Rfl: 2 .  finasteride (PROSCAR) 5 MG tablet, Take 1 tablet (5 mg total) by mouth daily., Disp: 90 tablet, Rfl: 2 .  fluticasone (FLONASE) 50 MCG/ACT nasal spray, Place 2 sprays into both nostrils daily., Disp: 48 g, Rfl: 1 .  niacin (NIASPAN) 1000 MG CR tablet, Take 1 tablet (1,000 mg total) by mouth at bedtime., Disp: 90 tablet, Rfl: 2 .  valACYclovir (VALTREX) 1000 MG tablet, Take 1 tablet (1,000 mg total) by mouth daily., Disp: 90 tablet, Rfl: 2 .  Vitamin D, Ergocalciferol, (DRISDOL) 1.25 MG (50000 UNIT) CAPS capsule, Take 1 capsule (50,000 Units total) by mouth every 7 (seven) days., Disp: 12  capsule, Rfl: 2 .  naproxen sodium (ALEVE) 220 MG tablet, Take 220 mg by mouth daily. (Patient not taking: Reported on 05/16/2020), Disp: , Rfl:    Allergies  Allergen Reactions  . Poison Ivy Extract    Past Medical History:  Diagnosis Date  . Actinic keratosis   . Allergy    Year Round   . Arthritis   . BPH (benign prostatic hyperplasia)   . Cancer ([place])    skin cancer - removed several times  . Cataract 2015   Right Eye   . Cataract    left  . ED  (erectile dysfunction)   . Hyperlipidemia     Past Surgical History:  Procedure Laterality Date  . CERVICAL SPINE SURGERY    . COLONOSCOPY  2009   tics only  . COLONOSCOPY  2016   TAs  . EYE SURGERY Right    cataract   . right shoulder    . skin cancer removal     right ear x 2    Social History   Socioeconomic History  . Marital status: Married    Spouse name: [name]  . Number of children: 6  . Years of education: Some college  . Highest education level: Not on file  Occupational History  . 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 Smoking status: Never Smoker  . Smokeless tobacco: Never Used  Vaping Use  . Vaping Use: Never used  Substance and Sexual Activity  . Alcohol use: No  . Drug use: No  . Sexual activity: Yes    Birth control/protection: None  Other Topics Concern  . Not on file  Social History Narrative   Lives with wife.     His sister in law just moved in- apartment area in garage     He has six step children, 1 passed aware -prostate cancer age 86      Caffeine use: 2 cups coffee per day    Right handed    Social Determinants of Health   Financial Resource Strain:   . Difficulty of Paying Living Expenses:   Food Insecurity:   . Worried About Charity fundraiser in the Last Year:   . Arboriculturist in the Last Year:   Transportation Needs:   . Film/video editor (Medical):   Marland Kitchen Lack of Transportation (Non-Medical):   Physical Activity:   . Days of Exercise per Week:   . Minutes of Exercise per Session:   Stress:   . Feeling of Stress :   Social Connections:   . Frequency of Communication with Friends and Family:   . Frequency of Social Gatherings with Friends and Family:   . Attends Religious Services:   . Active Member of Clubs or Organizations:   . Attends Archivist Meetings:   Marland Kitchen Marital Status:   Intimate Partner Violence:   . Fear of Current or Ex-Partner:   . Emotionally  Abused:   Marland Kitchen Physically Abused:   . Sexually Abused:         Objective:    BP 137/77   Pulse 67   Temp 97.8 F (36.6 C)   Resp 20   Ht _0  (1.778 m)   Wt 201 lb (91.2 kg)   SpO2 96%   BMI 28.84 kg/m   Wt Readings from Last 3 Encounters:  05/16/20 201 lb (91.2 kg)  02/08/20 210 lb (95.3 kg)  01/25/20 210 lb (95.3 kg)    Physical Exam Vitals reviewed.  Constitutional:      General: He is not in acute distress.    Appearance: Normal appearance. He is overweight. He is not ill-appearing, toxic-appearing or diaphoretic.  HENT:     Head: Normocephalic and atraumatic.     Right Ear: Tympanic membrane, ear canal and external ear normal. There is no impacted cerumen.     Left Ear: Tympanic membrane, ear canal and external ear normal. There is no impacted cerumen.     Nose: Nose normal. No congestion or rhinorrhea.     Mouth/Throat:     Mouth: Mucous membranes are moist.     Pharynx: Oropharynx is clear. No oropharyngeal exudate or posterior oropharyngeal erythema.  Eyes:     General: No scleral icterus.       Right eye: No discharge.        Left eye: No discharge.     Conjunctiva/sclera: Conjunctivae normal.     Pupils: Pupils are equal, round, and reactive to light.  Neck:     Vascular: No carotid bruit.  Cardiovascular:     Rate and Rhythm: Normal rate and regular rhythm.     Heart sounds: Normal heart sounds. No murmur heard.  No friction rub. No gallop.   Pulmonary:     Effort: Pulmonary effort is normal. No respiratory distress.     Breath sounds: Normal breath sounds. No stridor. No wheezing, rhonchi or rales.  Abdominal:     General: Abdomen is flat. Bowel sounds are normal. There is no distension.     Palpations: Abdomen is soft. There is no mass.     Tenderness: There is no abdominal tenderness. There is no guarding or rebound.     Hernia: No hernia is present.  Musculoskeletal:        General: Normal range of motion.     Cervical back: Normal range of  motion and neck supple. No rigidity. No muscular tenderness.     Right lower leg: No edema.     Left lower leg: No edema.  Feet:     Comments: Left food with minimal swelling over the ball of the foot where he stepped on a nail. No drainage or warmth noted.  Lymphadenopathy:     Cervical: No cervical adenopathy.  Skin:    General: Skin is warm and dry.     Capillary Refill: Capillary refill takes less than 2 seconds.  Neurological:     General: No focal deficit present.     Mental Status: He is alert and oriented to person, place, and time. Mental status is at baseline.  Psychiatric:        Mood and Affect: Mood normal.        Behavior: Behavior normal.        Thought Content: Thought content normal.        Judgment: Judgment normal.     Lab Results  Component Value Date   TSH 3.950 03/08/2019   Lab Results  Component Value Date   WBC 7.4 03/08/2019   HGB 14.6 03/08/2019   HCT 44.4 03/08/2019   MCV 91 03/08/2019   PLT 219 03/08/2019   Lab Results  Component Value Date   NA 141 11/16/2019   K 4.2 11/16/2019   CO2 25 11/16/2019   GLUCOSE 95 11/16/2019   BUN 17 11/16/2019   CREATININE 0.99 11/16/2019   BILITOT 0.5 11/16/2019   ALKPHOS 80 11/16/2019   AST  18 11/16/2019   ALT 20 11/16/2019   PROT 6.5 11/16/2019   ALBUMIN 4.1 11/16/2019   CALCIUM 9.1 11/16/2019   Lab Results  Component Value Date   CHOL 133 11/16/2019   Lab Results  Component Value Date   HDL 44 11/16/2019   Lab Results  Component Value Date   LDLCALC 68 11/16/2019   Lab Results  Component Value Date   TRIG 116 11/16/2019   Lab Results  Component Value Date   CHOLHDL 3.0 11/16/2019   No results found for: HGBA1C

## 2020-05-17 LAB — CBC WITH DIFFERENTIAL/PLATELET
Basophils Absolute: 0 10*3/uL (ref 0.0–0.2)
Basos: 1 %
EOS (ABSOLUTE): 0.3 10*3/uL (ref 0.0–0.4)
Eos: 5 %
Hematocrit: 44.7 % (ref 37.5–51.0)
Hemoglobin: 15 g/dL (ref 13.0–17.7)
Immature Grans (Abs): 0 10*3/uL (ref 0.0–0.1)
Immature Granulocytes: 0 %
Lymphocytes Absolute: 2.4 10*3/uL (ref 0.7–3.1)
Lymphs: 33 %
MCH: 30.3 pg (ref 26.6–33.0)
MCHC: 33.6 g/dL (ref 31.5–35.7)
MCV: 90 fL (ref 79–97)
Monocytes Absolute: 0.4 10*3/uL (ref 0.1–0.9)
Monocytes: 6 %
Neutrophils Absolute: 4 10*3/uL (ref 1.4–7.0)
Neutrophils: 55 %
Platelets: 218 10*3/uL (ref 150–450)
RBC: 4.95 x10E6/uL (ref 4.14–5.80)
RDW: 12.8 % (ref 11.6–15.4)
WBC: 7.3 10*3/uL (ref 3.4–10.8)

## 2020-05-17 LAB — LIPID PANEL
Chol/HDL Ratio: 2.7 ratio (ref 0.0–5.0)
Cholesterol, Total: 129 mg/dL (ref 100–199)
HDL: 47 mg/dL (ref >39)
LDL Chol Calc (NIH): 65 mg/dL (ref 0–99)
Triglycerides: 91 mg/dL (ref 0–149)
VLDL Cholesterol Cal: 17 mg/dL (ref 5–40)

## 2020-05-17 LAB — VITAMIN D 25 HYDROXY (VIT D DEFICIENCY, FRACTURES): Vit D, 25-Hydroxy: 53.5 ng/mL (ref 30.0–100.0)

## 2020-05-17 LAB — CMP14+EGFR
ALT: 18 IU/L (ref 0–44)
AST: 16 IU/L (ref 0–40)
Albumin/Globulin Ratio: 1.8 (ref 1.2–2.2)
Albumin: 4.5 g/dL (ref 3.7–4.7)
Alkaline Phosphatase: 74 IU/L (ref 48–121)
BUN/Creatinine Ratio: 24 (ref 10–24)
BUN: 22 mg/dL (ref 8–27)
Bilirubin Total: 0.7 mg/dL (ref 0.0–1.2)
CO2: 24 mmol/L (ref 20–29)
Calcium: 9.2 mg/dL (ref 8.6–10.2)
Chloride: 103 mmol/L (ref 96–106)
Creatinine, Ser: 0.92 mg/dL (ref 0.76–1.27)
GFR calc Af Amer: 95 mL/min/{1.73_m2} (ref >59)
GFR calc non Af Amer: 82 mL/min/{1.73_m2} (ref >59)
Globulin, Total: 2.5 g/dL (ref 1.5–4.5)
Glucose: 97 mg/dL (ref 65–99)
Potassium: 4.5 mmol/L (ref 3.5–5.2)
Sodium: 141 mmol/L (ref 134–144)
Total Protein: 7 g/dL (ref 6.0–8.5)

## 2020-06-10 ENCOUNTER — Other Ambulatory Visit: Payer: Self-pay | Admitting: Family Medicine

## 2020-06-10 DIAGNOSIS — J302 Other seasonal allergic rhinitis: Secondary | ICD-10-CM

## 2020-07-13 ENCOUNTER — Other Ambulatory Visit: Payer: Self-pay | Admitting: Family Medicine

## 2020-07-13 DIAGNOSIS — E559 Vitamin D deficiency, unspecified: Secondary | ICD-10-CM

## 2020-07-25 ENCOUNTER — Other Ambulatory Visit: Payer: Self-pay | Admitting: Family Medicine

## 2020-07-25 ENCOUNTER — Ambulatory Visit (INDEPENDENT_AMBULATORY_CARE_PROVIDER_SITE_OTHER): Payer: Medicare Other

## 2020-07-25 ENCOUNTER — Other Ambulatory Visit: Payer: Self-pay

## 2020-07-25 DIAGNOSIS — Z23 Encounter for immunization: Secondary | ICD-10-CM | POA: Diagnosis not present

## 2020-07-25 DIAGNOSIS — A6000 Herpesviral infection of urogenital system, unspecified: Secondary | ICD-10-CM

## 2020-07-26 ENCOUNTER — Other Ambulatory Visit: Payer: Self-pay | Admitting: *Deleted

## 2020-07-26 DIAGNOSIS — E559 Vitamin D deficiency, unspecified: Secondary | ICD-10-CM

## 2020-07-26 MED ORDER — VITAMIN D (ERGOCALCIFEROL) 1.25 MG (50000 UNIT) PO CAPS: 50000.0000 [IU] | ORAL_CAPSULE | ORAL | 2 refills | Status: DC

## 2020-08-10 ENCOUNTER — Other Ambulatory Visit: Payer: Self-pay | Admitting: Family Medicine

## 2020-08-10 DIAGNOSIS — E782 Mixed hyperlipidemia: Secondary | ICD-10-CM

## 2020-08-10 DIAGNOSIS — N400 Enlarged prostate without lower urinary tract symptoms: Secondary | ICD-10-CM

## 2020-09-04 ENCOUNTER — Other Ambulatory Visit: Payer: Self-pay | Admitting: Family Medicine

## 2020-09-04 DIAGNOSIS — N400 Enlarged prostate without lower urinary tract symptoms: Secondary | ICD-10-CM

## 2020-09-04 DIAGNOSIS — E782 Mixed hyperlipidemia: Secondary | ICD-10-CM

## 2020-10-11 DIAGNOSIS — L814 Other melanin hyperpigmentation: Secondary | ICD-10-CM | POA: Diagnosis not present

## 2020-10-11 DIAGNOSIS — L570 Actinic keratosis: Secondary | ICD-10-CM | POA: Diagnosis not present

## 2020-10-11 DIAGNOSIS — L820 Inflamed seborrheic keratosis: Secondary | ICD-10-CM | POA: Diagnosis not present

## 2020-10-11 DIAGNOSIS — D485 Neoplasm of uncertain behavior of skin: Secondary | ICD-10-CM | POA: Diagnosis not present

## 2020-10-11 DIAGNOSIS — Z85828 Personal history of other malignant neoplasm of skin: Secondary | ICD-10-CM | POA: Diagnosis not present

## 2020-10-11 DIAGNOSIS — L821 Other seborrheic keratosis: Secondary | ICD-10-CM | POA: Diagnosis not present

## 2020-11-01 DIAGNOSIS — N401 Enlarged prostate with lower urinary tract symptoms: Secondary | ICD-10-CM | POA: Diagnosis not present

## 2020-11-01 DIAGNOSIS — R3911 Hesitancy of micturition: Secondary | ICD-10-CM | POA: Diagnosis not present

## 2020-11-01 DIAGNOSIS — R351 Nocturia: Secondary | ICD-10-CM | POA: Diagnosis not present

## 2020-11-16 ENCOUNTER — Ambulatory Visit: Payer: Medicare Other | Admitting: Family Medicine

## 2020-12-03 ENCOUNTER — Other Ambulatory Visit: Payer: Self-pay | Admitting: Family Medicine

## 2020-12-03 DIAGNOSIS — E782 Mixed hyperlipidemia: Secondary | ICD-10-CM

## 2020-12-04 ENCOUNTER — Other Ambulatory Visit: Payer: Self-pay

## 2020-12-04 ENCOUNTER — Encounter: Payer: Self-pay | Admitting: Family Medicine

## 2020-12-04 ENCOUNTER — Ambulatory Visit (INDEPENDENT_AMBULATORY_CARE_PROVIDER_SITE_OTHER): Payer: Medicare Other | Admitting: Family Medicine

## 2020-12-04 VITALS — BP 131/81 | HR 67 | Temp 98.1°F | Ht 70.0 in | Wt 202.0 lb

## 2020-12-04 DIAGNOSIS — E559 Vitamin D deficiency, unspecified: Secondary | ICD-10-CM

## 2020-12-04 DIAGNOSIS — N400 Enlarged prostate without lower urinary tract symptoms: Secondary | ICD-10-CM | POA: Diagnosis not present

## 2020-12-04 DIAGNOSIS — J302 Other seasonal allergic rhinitis: Secondary | ICD-10-CM

## 2020-12-04 DIAGNOSIS — A6000 Herpesviral infection of urogenital system, unspecified: Secondary | ICD-10-CM

## 2020-12-04 DIAGNOSIS — E782 Mixed hyperlipidemia: Secondary | ICD-10-CM | POA: Diagnosis not present

## 2020-12-04 DIAGNOSIS — Z125 Encounter for screening for malignant neoplasm of prostate: Secondary | ICD-10-CM

## 2020-12-04 NOTE — Progress Notes (Signed)
Assessment & Plan:  1. Mixed hyperlipidemia Well controlled on current regimen.  - CBC with Differential/Platelet - CMP14+EGFR - Lipid panel  2. Vitamin D deficiency Well controlled on current regimen.  - VITAMIN D 25 Hydroxy (Vit-D Deficiency, Fractures)  3. Genital herpes simplex, unspecified site Well controlled on current regimen.  - CMP14+EGFR  4. Benign prostatic hyperplasia, unspecified whether lower urinary tract symptoms present Well controlled on current regimen.  - tamsulosin (FLOMAX) 0.4 MG CAPS capsule; Take 0.4 mg by mouth at bedtime. - CMP14+EGFR - PR PSA, TOTAL SCREENING - PSA, total and free - Specimen status report  5. Seasonal allergies Well controlled on current regimen.   6. Prostate cancer screening - PSA, total and free   Return in about 6 months (around 06/06/2021) for follow-up of chronic medication conditions.  Samuel Limes, MSN, APRN, FNP-C [facility]  Subjective:    Patient ID: Samuel Wheeler, male    DOB: June 05, 1946, 75 y.o.   MRN: [id number]  Patient Care Team: Samuel Brooklyn, FNP as PCP - General (Family Medicine) Samuel Breeding, MD as Consulting Physician (Cardiology) Samuel Artist, MD as Consulting Physician (Gastroenterology) Samuel Craze, MD as Referring Physician (Unknown Physician Specialty) Samuel Maudlin, MD as Consulting Physician (Orthopedic Surgery) Samuel Labs, MD as Referring Physician (Optometry)   Chief Complaint:  Chief Complaint  Patient presents with  . Hyperlipidemia    6 month follow up of chronic medical conditions     HPI: Samuel Wheeler is a 75 y.o. male presenting on 12/04/2020 for Hyperlipidemia (6 month follow up of chronic medical conditions/)  Patient's wife died just 2 weeks ago, five years after being diagnosed with lung cancer. Patient feels he is doing okay.  Depression screen [facility] 2/9 12/04/2020 05/16/2020 12/07/2019  Decreased Interest 0 0 0  Down,  Depressed, Hopeless 0 0 0  PHQ - 2 Score 0 0 0    New complaints: None  Social history:  Relevant past medical, surgical, family and social history reviewed and updated as indicated. Interim medical history since our last visit reviewed.  Allergies and medications reviewed and updated.  DATA REVIEWED: CHART IN EPIC  ROS: Negative unless specifically indicated above in HPI.    Current Outpatient Medications:  .  albuterol (PROAIR HFA) 108 (90 Base) MCG/ACT inhaler, USE 2 INHALATIONS EVERY 6 HOURS AS NEEDED FOR WHEEZING OR SHORTNESS OF BREATH, Disp: 51 g, Rfl: 3 .  aspirin 81 MG chewable tablet, Chew 81 mg by mouth daily., Disp: , Rfl:  .  atorvastatin (LIPITOR) 20 MG tablet, TAKE 1 TABLET DAILY, Disp: 90 tablet, Rfl: 3 .  cetirizine (ZYRTEC) 10 MG tablet, Take 1 tablet (10 mg total) by mouth daily., Disp: 90 tablet, Rfl: 2 .  finasteride (PROSCAR) 5 MG tablet, TAKE 1 TABLET DAILY, Disp: 90 tablet, Rfl: 3 .  fluticasone (FLONASE) 50 MCG/ACT nasal spray, USE 2 SPRAYS IN EACH NOSTRIL DAILY, Disp: 48 g, Rfl: 1 .  naproxen sodium (ALEVE) 220 MG tablet, Take 220 mg by mouth daily., Disp: , Rfl:  .  niacin (NIASPAN) 1000 MG CR tablet, TAKE 1 TABLET AT BEDTIME, Disp: 90 tablet, Rfl: 0 .  tamsulosin (FLOMAX) 0.4 MG CAPS capsule, Take 0.4 mg by mouth at bedtime., Disp: , Rfl:  .  valACYclovir (VALTREX) 1000 MG tablet, TAKE 1 TABLET DAILY, Disp: 90 tablet, Rfl: 1 .  Vitamin D, Ergocalciferol, (DRISDOL) 1.25 MG (50000 UNIT) CAPS capsule, Take 1 capsule (50,000 Units total) by mouth  every 7 (seven) days., Disp: 12 capsule, Rfl: 2   Allergies  Allergen Reactions  . Poison Ivy Extract    Past Medical History:  Diagnosis Date  . Actinic keratosis   . Allergy    Year Round   . Arthritis   . BPH (benign prostatic hyperplasia)   . Cancer ([place])    skin cancer - removed several times  . Cataract    Removed  . ED (erectile dysfunction)   . Hyperlipidemia     Past Surgical History:   Procedure Laterality Date  . CATARACT EXTRACTION Bilateral   . CERVICAL SPINE SURGERY    . COLONOSCOPY  2009   tics only  . COLONOSCOPY  2016   TAs  . right shoulder    . skin cancer removal     right ear x 2    Social History   Socioeconomic History  . Marital status: Married    Spouse name: Samuel Wheeler  . Number of children: 6  . Years of education: Some college  . Highest education level: Not on file  Occupational History  . 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 Smoking status: Never Smoker  . Smokeless tobacco: Never Used  Vaping Use  . Vaping Use: Never used  Substance and Sexual Activity  . Alcohol use: No  . Drug use: No  . Sexual activity: Yes    Birth control/protection: None  Other Topics Concern  . Not on file  Social History Narrative   Lives with wife.     His sister in law just moved in- apartment area in garage     He has six step children, 1 passed aware -prostate cancer age 14      Caffeine use: 2 cups coffee per day    Right handed    Social Determinants of Health   Financial Resource Strain: Not on file  Food Insecurity: Not on file  Transportation Needs: Not on file  Physical Activity: Not on file  Stress: Not on file  Social Connections: Not on file  Intimate Partner Violence: Not on file        Objective:    BP 131/81   Pulse 67   Temp 98.1 F (36.7 C) (Temporal)   Ht _0  (1.778 m)   Wt 202 lb (91.6 kg)   SpO2 96%   BMI 28.98 kg/m   Wt Readings from Last 3 Encounters:  12/04/20 202 lb (91.6 kg)  05/16/20 201 lb (91.2 kg)  02/08/20 210 lb (95.3 kg)    Physical Exam Vitals reviewed.  Constitutional:      General: He is not in acute distress.    Appearance: Normal appearance. He is overweight. He is not ill-appearing, toxic-appearing or diaphoretic.  HENT:     Head: Normocephalic and atraumatic.     Right Ear: Tympanic membrane, ear canal and external ear normal. There is no  impacted cerumen.     Left Ear: Tympanic membrane, ear canal and external ear normal. There is no impacted cerumen.     Nose: Nose normal. No congestion or rhinorrhea.     Mouth/Throat:     Mouth: Mucous membranes are moist.     Pharynx: Oropharynx is clear. No oropharyngeal exudate or posterior oropharyngeal erythema.  Eyes:     General: No scleral icterus.       Right eye: No discharge.        Left eye: No discharge.  Conjunctiva/sclera: Conjunctivae normal.     Pupils: Pupils are equal, round, and reactive to light.  Neck:     Vascular: No carotid bruit.  Cardiovascular:     Rate and Rhythm: Normal rate and regular rhythm.     Heart sounds: Normal heart sounds. No murmur heard. No friction rub. No gallop.   Pulmonary:     Effort: Pulmonary effort is normal. No respiratory distress.     Breath sounds: Normal breath sounds. No stridor. No wheezing, rhonchi or rales.  Abdominal:     General: Abdomen is flat. Bowel sounds are normal. There is no distension.     Palpations: Abdomen is soft. There is no mass.     Tenderness: There is no abdominal tenderness. There is no guarding or rebound.     Hernia: No hernia is present.  Musculoskeletal:        General: Normal range of motion.     Cervical back: Normal range of motion and neck supple. No rigidity. No muscular tenderness.     Right lower leg: No edema.     Left lower leg: No edema.  Lymphadenopathy:     Cervical: No cervical adenopathy.  Skin:    General: Skin is warm and dry.     Capillary Refill: Capillary refill takes less than 2 seconds.  Neurological:     General: No focal deficit present.     Mental Status: He is alert and oriented to person, place, and time. Mental status is at baseline.  Psychiatric:        Mood and Affect: Mood normal.        Behavior: Behavior normal.        Thought Content: Thought content normal.        Judgment: Judgment normal.     Lab Results  Component Value Date   TSH 3.950  03/08/2019   Lab Results  Component Value Date   WBC 7.3 05/16/2020   HGB 15.0 05/16/2020   HCT 44.7 05/16/2020   MCV 90 05/16/2020   PLT 218 05/16/2020   Lab Results  Component Value Date   NA 141 05/16/2020   K 4.5 05/16/2020   CO2 24 05/16/2020   GLUCOSE 97 05/16/2020   BUN 22 05/16/2020   CREATININE 0.92 05/16/2020   BILITOT 0.7 05/16/2020   ALKPHOS 74 05/16/2020   AST 16 05/16/2020   ALT 18 05/16/2020   PROT 7.0 05/16/2020   ALBUMIN 4.5 05/16/2020   CALCIUM 9.2 05/16/2020   Lab Results  Component Value Date   CHOL 129 05/16/2020   Lab Results  Component Value Date   HDL 47 05/16/2020   Lab Results  Component Value Date   LDLCALC 65 05/16/2020   Lab Results  Component Value Date   TRIG 91 05/16/2020   Lab Results  Component Value Date   CHOLHDL 2.7 05/16/2020   No results found for: HGBA1C

## 2020-12-04 NOTE — Patient Instructions (Signed)
Preventive Care 74 Years and Older, Male Preventive care refers to lifestyle choices and visits with your health care provider that can promote health and wellness. This includes:  A yearly physical exam. This is also called an annual wellness visit.  Regular dental and eye exams.  Immunizations.  Screening for certain conditions.  Healthy lifestyle choices, such as: ? Eating a healthy diet. ? Getting regular exercise. ? Not using drugs or products that contain nicotine and tobacco. ? Limiting alcohol use. What can I expect for my preventive care visit? Physical exam Your health care provider will check your:  Height and weight. These may be used to calculate your BMI (body mass index). BMI is a measurement that tells if you are at a healthy weight.  Heart rate and blood pressure.  Body temperature.  Skin for abnormal spots. Counseling Your health care provider may ask you questions about your:  Past medical problems.  Family's medical history.  Alcohol, tobacco, and drug use.  Emotional well-being.  Home life and relationship well-being.  Sexual activity.  Diet, exercise, and sleep habits.  History of falls.  Memory and ability to understand (cognition).  Work and work environment.  Access to firearms. What immunizations do I need? Vaccines are usually given at various ages, according to a schedule. Your health care provider will recommend vaccines for you based on your age, medical history, and lifestyle or other factors, such as travel or where you work.   What tests do I need? Blood tests  Lipid and cholesterol levels. These may be checked every 5 years, or more often depending on your overall health.  Hepatitis C test.  Hepatitis B test. Screening  Lung cancer screening. You may have this screening every year starting at age 55 if you have a 30-pack-year history of smoking and currently smoke or have quit within the past 15 years.  Colorectal  cancer screening. ? All adults should have this screening starting at age 50 and continuing until age 75. ? Your health care provider may recommend screening at age 45 if you are at increased risk. ? You will have tests every 1-10 years, depending on your results and the type of screening test.  Prostate cancer screening. Recommendations will vary depending on your family history and other risks.  Genital exam to check for testicular cancer or hernias.  Diabetes screening. ? This is done by checking your blood sugar (glucose) after you have not eaten for a while (fasting). ? You may have this done every 1-3 years.  Abdominal aortic aneurysm (AAA) screening. You may need this if you are a current or former smoker.  STD (sexually transmitted disease) testing, if you are at risk. Follow these instructions at home: Eating and drinking  Eat a diet that includes fresh fruits and vegetables, whole grains, lean protein, and low-fat dairy products. Limit your intake of foods with high amounts of sugar, saturated fats, and salt.  Take vitamin and mineral supplements as recommended by your health care provider.  Do not drink alcohol if your health care provider tells you not to drink.  If you drink alcohol: ? Limit how much you have to 0-2 drinks a day. ? Be aware of how much alcohol is in your drink. In the [place] one drink equals one 12 oz bottle of beer (355 mL), one 5 oz glass of wine (148 mL), or one 1 oz glass of hard liquor (44 mL).   Lifestyle  Take daily care of your teeth   and gums. Brush your teeth every morning and night with fluoride toothpaste. Floss one time each day.  Stay active. Exercise for at least 30 minutes 5 or more days each week.  Do not use any products that contain nicotine or tobacco, such as cigarettes, e-cigarettes, and chewing tobacco. If you need help quitting, ask your health care provider.  Do not use drugs.  If you are sexually active, practice safe sex.  Use a condom or other form of protection to prevent STIs (sexually transmitted infections).  Talk with your health care provider about taking a low-dose aspirin or statin.  Find healthy ways to cope with stress, such as: ? Meditation, yoga, or listening to music. ? Journaling. ? Talking to a trusted person. ? Spending time with friends and family. Safety  Always wear your seat belt while driving or riding in a vehicle.  Do not drive: ? If you have been drinking alcohol. Do not ride with someone who has been drinking. ? When you are tired or distracted. ? While texting.  Wear a helmet and other protective equipment during sports activities.  If you have firearms in your house, make sure you follow all gun safety procedures. What's next?  Visit your health care provider once a year for an annual wellness visit.  Ask your health care provider how often you should have your eyes and teeth checked.  Stay up to date on all vaccines. This information is not intended to replace advice given to you by your health care provider. Make sure you discuss any questions you have with your health care provider. Document Revised: 06/21/2019 Document Reviewed: 09/16/2018 Elsevier Patient Education  2021 Elsevier Inc.  

## 2020-12-05 LAB — LIPID PANEL
Chol/HDL Ratio: 2.9 ratio (ref 0.0–5.0)
Cholesterol, Total: 150 mg/dL (ref 100–199)
HDL: 52 mg/dL (ref >39)
LDL Chol Calc (NIH): 84 mg/dL (ref 0–99)
Triglycerides: 74 mg/dL (ref 0–149)
VLDL Cholesterol Cal: 14 mg/dL (ref 5–40)

## 2020-12-05 LAB — CBC WITH DIFFERENTIAL/PLATELET
Basophils Absolute: 0.1 10*3/uL (ref 0.0–0.2)
Basos: 1 %
EOS (ABSOLUTE): 0.3 10*3/uL (ref 0.0–0.4)
Eos: 3 %
Hematocrit: 44.9 % (ref 37.5–51.0)
Hemoglobin: 15.1 g/dL (ref 13.0–17.7)
Immature Grans (Abs): 0 10*3/uL (ref 0.0–0.1)
Immature Granulocytes: 0 %
Lymphocytes Absolute: 2.7 10*3/uL (ref 0.7–3.1)
Lymphs: 38 %
MCH: 30.1 pg (ref 26.6–33.0)
MCHC: 33.6 g/dL (ref 31.5–35.7)
MCV: 89 fL (ref 79–97)
Monocytes Absolute: 0.6 10*3/uL (ref 0.1–0.9)
Monocytes: 8 %
Neutrophils Absolute: 3.7 10*3/uL (ref 1.4–7.0)
Neutrophils: 50 %
Platelets: 294 10*3/uL (ref 150–450)
RBC: 5.02 x10E6/uL (ref 4.14–5.80)
RDW: 12.6 % (ref 11.6–15.4)
WBC: 7.3 10*3/uL (ref 3.4–10.8)

## 2020-12-05 LAB — CMP14+EGFR
ALT: 21 IU/L (ref 0–44)
AST: 17 IU/L (ref 0–40)
Albumin/Globulin Ratio: 1.5 (ref 1.2–2.2)
Albumin: 4.3 g/dL (ref 3.7–4.7)
Alkaline Phosphatase: 93 IU/L (ref 44–121)
BUN/Creatinine Ratio: 22 (ref 10–24)
BUN: 20 mg/dL (ref 8–27)
Bilirubin Total: 0.4 mg/dL (ref 0.0–1.2)
CO2: 23 mmol/L (ref 20–29)
Calcium: 9 mg/dL (ref 8.6–10.2)
Chloride: 102 mmol/L (ref 96–106)
Creatinine, Ser: 0.93 mg/dL (ref 0.76–1.27)
Globulin, Total: 2.9 g/dL (ref 1.5–4.5)
Glucose: 98 mg/dL (ref 65–99)
Potassium: 4.7 mmol/L (ref 3.5–5.2)
Sodium: 140 mmol/L (ref 134–144)
Total Protein: 7.2 g/dL (ref 6.0–8.5)
eGFR: 87 mL/min/{1.73_m2} (ref >59)

## 2020-12-05 LAB — VITAMIN D 25 HYDROXY (VIT D DEFICIENCY, FRACTURES): Vit D, 25-Hydroxy: 51.4 ng/mL (ref 30.0–100.0)

## 2020-12-06 ENCOUNTER — Other Ambulatory Visit: Payer: Self-pay

## 2020-12-06 DIAGNOSIS — Z125 Encounter for screening for malignant neoplasm of prostate: Secondary | ICD-10-CM

## 2020-12-07 LAB — SPECIMEN STATUS REPORT

## 2020-12-07 LAB — PSA, TOTAL AND FREE
PSA, Free Pct: 18.6 %
PSA, Free: 0.13 ng/mL
Prostate Specific Ag, Serum: 0.7 ng/mL (ref 0.0–4.0)

## 2020-12-09 ENCOUNTER — Encounter: Payer: Self-pay | Admitting: Family Medicine

## 2020-12-10 ENCOUNTER — Other Ambulatory Visit: Payer: Self-pay | Admitting: Family Medicine

## 2020-12-10 DIAGNOSIS — J302 Other seasonal allergic rhinitis: Secondary | ICD-10-CM

## 2020-12-18 ENCOUNTER — Ambulatory Visit (INDEPENDENT_AMBULATORY_CARE_PROVIDER_SITE_OTHER): Payer: Medicare Other | Admitting: *Deleted

## 2020-12-18 VITALS — BP 130/80 | Ht 70.0 in | Wt 202.0 lb

## 2020-12-18 DIAGNOSIS — Z0000 Encounter for general adult medical examination without abnormal findings: Secondary | ICD-10-CM | POA: Diagnosis not present

## 2020-12-18 NOTE — Progress Notes (Signed)
MEDICARE ANNUAL WELLNESS VISIT  12/18/2020  Telephone Visit Disclaimer This Medicare AWV was conducted by telephone due to national recommendations for restrictions regarding the COVID-19 Pandemic (e.g. social distancing).  I verified, using two identifiers, that I am speaking with Samuel Wheeler or their authorized healthcare agent. I discussed the limitations, risks, security, and privacy concerns of performing an evaluation and management service by telephone and the potential availability of an in-person appointment in the future. The patient expressed understanding and agreed to proceed.  Location of Patient: in his home Location of Provider (nurse):  In office  Subjective:    Samuel Wheeler is a 75 y.o. male patient of Samuel Brooklyn, FNP who had a Medicare Annual Wellness Visit today via telephone. Samuel Wheeler is Retired and lives alone. he has 6 step- children. he reports that he is socially active and does interact with friends/family regularly. he is moderately physically active and enjoys yardwork and working on cars.  Patient Care Team: Samuel Brooklyn, FNP as PCP - General (Family Medicine) Samuel Breeding, MD as Consulting Physician (Cardiology) Samuel Artist, MD as Consulting Physician (Gastroenterology) Samuel Craze, MD as Referring Physician (Unknown Physician Specialty) Samuel Maudlin, MD as Consulting Physician (Orthopedic Surgery) Samuel Labs, MD as Referring Physician (Optometry)  Advanced Directives 12/18/2020 12/07/2019 12/03/2018 01/28/2017 12/16/2016 01/25/2015 01/22/2015  Does Patient Have a Medical Advance Directive? Yes Yes Yes Yes No Yes Yes  Type of Paramedic of [place];Living will [place];Living will Living will Living will - - [place];Living will  Does patient want to make changes to medical advance directive? - No - Patient declined - - - - -  Copy of [place]  in Chart? No - copy requested - - - - No - copy requested [facility] Utilization Over the Past 12 Months: # of hospitalizations or ER visits: 0 # of surgeries: 0  Review of Systems    Patient reports that his overall health is unchanged compared to last year.  ROS - General = negative   Patient Reported Readings (BP, Pulse, CBG, Weight, etc) BP 130/80   Ht 5\' 10"  (1.778 m)   Wt 202 lb (91.6 kg)   BMI 28.98 kg/m    Pain Assessment       Current Medications & Allergies (verified) Allergies as of 12/18/2020      Reactions   Poison Ivy Extract       Medication List       Accurate as of December 18, 2020  9:57 AM. If you have any questions, ask your nurse or doctor.        albuterol 108 (90 Base) MCG/ACT inhaler Commonly known as: ProAir HFA USE 2 INHALATIONS EVERY 6 HOURS AS NEEDED FOR WHEEZING OR SHORTNESS OF BREATH   aspirin 81 MG chewable tablet Chew 81 mg by mouth daily.   atorvastatin 20 MG tablet Commonly known as: LIPITOR TAKE 1 TABLET DAILY   cetirizine 10 MG tablet Commonly known as: ZYRTEC Take 1 tablet (10 mg total) by mouth daily.   finasteride 5 MG tablet Commonly known as: PROSCAR TAKE 1 TABLET DAILY   fluticasone 50 MCG/ACT nasal spray Commonly known as: FLONASE USE 2 SPRAYS IN EACH NOSTRIL DAILY   naproxen sodium 220 MG tablet Commonly known as: ALEVE Take 220 mg by mouth daily.   niacin 1000 MG CR tablet Commonly known as: NIASPAN TAKE 1 TABLET  AT BEDTIME   tamsulosin 0.4 MG Caps capsule Commonly known as: FLOMAX Take 0.4 mg by mouth at bedtime.   valACYclovir 1000 MG tablet Commonly known as: VALTREX TAKE 1 TABLET DAILY   Vitamin D (Ergocalciferol) 1.25 MG (50000 UNIT) Caps capsule Commonly known as: DRISDOL Take 1 capsule (50,000 Units total) by mouth every 7 (seven) days.       History (reviewed): Past Medical History:  Diagnosis Date  . Actinic keratosis   . Allergy    Year Round   . Arthritis   . BPH (benign  prostatic hyperplasia)   . Cancer ([place])    skin cancer - removed several times  . Cataract    Removed  . ED (erectile dysfunction)   . Hyperlipidemia    Past Surgical History:  Procedure Laterality Date  . CATARACT EXTRACTION Bilateral   . CERVICAL SPINE SURGERY    . COLONOSCOPY  2009   tics only  . COLONOSCOPY  2016   TAs  . right shoulder    . skin cancer removal     right ear x 2   Family History  Problem Relation Age of Onset  . Stroke Mother 26  . Stroke Father 28  . Diabetes Father   . COPD Father   . Heart disease Sister        "Mild heart attack", pacemaker  . Stroke Brother 61  . Syncope episode Brother   . Parkinson's disease Sister   . Stroke Sister   . Heart disease Sister   . Colon cancer Sister 57  . Colon polyps Sister   . Skin cancer Sister   . Melanoma Sister   . Skin cancer Sister   . Bone cancer Maternal Aunt   . Esophageal cancer Neg Hx   . Rectal cancer Neg Hx   . Stomach cancer Neg Hx    Social History   Socioeconomic History  . Marital status: Widowed    Spouse name: [name]  . Number of children: 6  . Years of education: Some college  . Highest education level: Not on file  Occupational History  . 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 Smoking status: Never Smoker  . Smokeless tobacco: Never Used  Vaping Use  . Vaping Use: Never used  Substance and Sexual Activity  . Alcohol use: No  . Drug use: No  . Sexual activity: Yes    Birth control/protection: None  Other Topics Concern  . Not on file  Social History Narrative   Lives alone  - wife passed away 2020-12-01.      He has six step children, 1 passed aware -prostate cancer age 49 - several are local       Caffeine use: 2 cups coffee per day    Right handed    Social Determinants of Health   Financial Resource Strain: Not on file  Food Insecurity: Not on file  Transportation Needs: Not on file  Physical Activity: Not on file   Stress: Not on file  Social Connections: Not on file    Activities of Daily Living In your present state of health, do you have any difficulty performing the following activities: 12/18/2020  Hearing? Y  Comment from [employer]? N  Difficulty concentrating or making decisions? N  Walking or climbing stairs? N  Dressing or bathing? N  Doing errands, shopping? N  Preparing Food and eating ? N  Using the  Toilet? N  In the past six months, have you accidently leaked urine? N  Do you have problems with loss of bowel control? N  Managing your Medications? N  Managing your Finances? N  Housekeeping or managing your Housekeeping? N  Some recent data might be hidden    Patient Education/ Literacy    Exercise Current Exercise Habits: Home exercise routine, Type of exercise: strength training/weights;stretching, Time (Minutes): 30, Frequency (Times/Week): 6, Weekly Exercise (Minutes/Week): 180, Intensity: Mild, Exercise limited by: None identified  Diet Patient reports consuming 2 meals a day and 2 snack(s) a day Patient reports that his primary diet is: Regular Patient reports that she does have regular access to food.   Depression Screen PHQ 2/9 Scores 12/18/2020 12/04/2020 05/16/2020 12/07/2019 11/16/2019 12/03/2018 09/20/2018  PHQ - 2 Score 1 0 0 0 0 0 0     Fall Risk Fall Risk  12/18/2020 12/04/2020 05/16/2020 12/07/2019 11/16/2019  Falls in the past year? 0 0 0 0 0  Number falls in past yr: - - - - -  Injury with Fall? - - - - -  Comment - - - - -     Objective:  Samuel Wheeler seemed alert and oriented and he participated appropriately during our telephone visit.  Blood Pressure Weight BMI  BP Readings from Last 3 Encounters:  12/18/20 130/80  12/04/20 131/81  05/16/20 137/77   Wt Readings from Last 3 Encounters:  12/18/20 202 lb (91.6 kg)  12/04/20 202 lb (91.6 kg)  05/16/20 201 lb (91.2 kg)   BMI Readings from Last 1 Encounters:  12/18/20 28.98 kg/m    *Unable to  obtain current vital signs, weight, and BMI due to telephone visit type  Hearing/Vision  . Adem did not seem to have difficulty with hearing/understanding during the telephone conversation . Reports that he has not had a formal eye exam by an eye care professional within the past year . Reports that he has not had a formal hearing evaluation within the past year *Unable to fully assess hearing and vision during telephone visit type  Cognitive Function: 6CIT Screen 12/18/2020 12/07/2019  What Year? 0 points 0 points  What month? 0 points 0 points  What time? 0 points 0 points  Count back from 20 0 points 0 points  Months in reverse 0 points 2 points  Repeat phrase 0 points 4 points  Total Score 0 6   (Normal:0-7, Significant for Dysfunction: >8)  Normal Cognitive Function Screening: Yes   Immunization & Health Maintenance Record Immunization History  Administered Date(s) Administered  . Fluad Quad(high Dose 65+) 06/21/2019, 07/25/2020  . Influenza, High Dose Seasonal PF 07/20/2017, 06/30/2018  . Influenza,inj,Quad PF,6+ Mos 08/03/2013, 07/10/2014, 07/09/2015, 07/08/2016  . Moderna Sars-Covid-2 Vaccination 11/11/2019, 12/10/2019, 08/14/2020  . Pneumococcal Conjugate-13 07/20/2014  . Pneumococcal Polysaccharide-23 07/06/2012  . Tdap 06/11/2009, 11/16/2019  . Zoster 01/27/2013  . Zoster Recombinat (Shingrix) 03/04/2017, 08/25/2017    Health Maintenance  Topic Date Due  . COVID-19 Vaccine (4 - Booster for Moderna series) 02/11/2021  . COLONOSCOPY (Pts 45-23yrs Insurance coverage will need to be confirmed)  02/08/2023  . TETANUS/TDAP  11/15/2029  . INFLUENZA VACCINE  Completed  . Hepatitis C Screening  Completed  . PNA vac Low Risk Adult  Completed  . HPV VACCINES  Aged Out       Assessment  This is a routine wellness examination for Samuel Wheeler.  Health Maintenance: Due or Overdue There are no preventive care reminders to  display for this patient.  Samuel Wheeler  does not need a referral for Community Assistance: Care Management:   no Social Work:    no Prescription Assistance:  no Nutrition/Diabetes Education:  no   Plan:  Personalized Goals Goals Addressed            This Visit's Progress   . Increase physical activity   On track    Work on personal goals     . Prevent Falls   On track    Has dizziness - wants to figure out what is causing it.       Personalized Health Maintenance & Screening Recommendations  up to date   Lung Cancer Screening Recommended: no (Low Dose CT Chest recommended if Age 12-80 years, 30 pack-year currently smoking OR have quit w/in past 15 years) Hepatitis C Screening recommended: no HIV Screening recommended: no  Advanced Directives: Written information was not prepared per patient's request.  Referrals & Orders No orders of the defined types were placed in this encounter.   Follow-up Plan . Follow-up with Samuel Brooklyn, FNP as planned    I have personally reviewed and noted the following in the patient's chart:   . Medical and social history . Use of alcohol, tobacco or illicit drugs  . Current medications and supplements . Functional ability and status . Nutritional status . Physical activity . Advanced directives . List of other physicians . Hospitalizations, surgeries, and ER visits in previous 12 months . Vitals . Screenings to include cognitive, depression, and falls . Referrals and appointments  In addition, I have reviewed and discussed with Samuel Wheeler certain preventive protocols, quality metrics, and best practice recommendations. A written personalized care plan for preventive services as well as general preventive health recommendations is available and can be mailed to the patient at his request.      [place] Dec  12/18/2020

## 2020-12-18 NOTE — Patient Instructions (Signed)
  [name] , Thank you for taking time to come for your Medicare Wellness Visit. I appreciate your ongoing commitment to your health goals. Please review the following plan we discussed and let me know if I can assist you in the future.   These are the goals we discussed: Goals    . Increase physical activity     Work on Personnel officer     . Prevent Falls     Has dizziness - wants to figure out what is causing it.        This is a list of the screening recommended for you and due dates:  Health Maintenance  Topic Date Due  . COVID-19 Vaccine (4 - Booster for Moderna series) 02/11/2021  . Colon Cancer Screening  02/08/2023  . Tetanus Vaccine  11/15/2029  . Flu Shot  Completed  .  Hepatitis C: One time screening is recommended by Center for Disease Control  (CDC) for  adults born from [birth year] through 1964.   Completed  . Pneumonia vaccines  Completed  . HPV Vaccine  Aged Out

## 2021-01-21 ENCOUNTER — Other Ambulatory Visit: Payer: Self-pay | Admitting: Family Medicine

## 2021-01-21 DIAGNOSIS — J302 Other seasonal allergic rhinitis: Secondary | ICD-10-CM

## 2021-01-21 DIAGNOSIS — A6000 Herpesviral infection of urogenital system, unspecified: Secondary | ICD-10-CM

## 2021-03-04 ENCOUNTER — Other Ambulatory Visit: Payer: Self-pay | Admitting: Family Medicine

## 2021-03-04 DIAGNOSIS — E782 Mixed hyperlipidemia: Secondary | ICD-10-CM

## 2021-03-05 ENCOUNTER — Ambulatory Visit (INDEPENDENT_AMBULATORY_CARE_PROVIDER_SITE_OTHER): Payer: Medicare Other | Admitting: Nurse Practitioner

## 2021-03-05 ENCOUNTER — Encounter: Payer: Self-pay | Admitting: Nurse Practitioner

## 2021-03-05 ENCOUNTER — Other Ambulatory Visit: Payer: Self-pay

## 2021-03-05 VITALS — BP 138/84 | HR 74 | Temp 97.9°F | Ht 70.0 in | Wt 203.0 lb

## 2021-03-05 DIAGNOSIS — S30860A Insect bite (nonvenomous) of lower back and pelvis, initial encounter: Secondary | ICD-10-CM | POA: Diagnosis not present

## 2021-03-05 DIAGNOSIS — W57XXXA Bitten or stung by nonvenomous insect and other nonvenomous arthropods, initial encounter: Secondary | ICD-10-CM | POA: Diagnosis not present

## 2021-03-05 DIAGNOSIS — G8929 Other chronic pain: Secondary | ICD-10-CM | POA: Insufficient documentation

## 2021-03-05 DIAGNOSIS — M25512 Pain in left shoulder: Secondary | ICD-10-CM | POA: Insufficient documentation

## 2021-03-05 MED ORDER — DOXYCYCLINE HYCLATE 100 MG PO TABS: 100.0000 mg | ORAL_TABLET | Freq: Two times a day (BID) | ORAL | 0 refills | Status: DC

## 2021-03-05 MED ORDER — PREDNISONE 10 MG (21) PO TBPK: ORAL_TABLET | ORAL | 0 refills | Status: DC

## 2021-03-05 MED ORDER — CYCLOBENZAPRINE HCL 5 MG PO TABS: 5.0000 mg | ORAL_TABLET | Freq: Three times a day (TID) | ORAL | 1 refills | Status: DC | PRN

## 2021-03-05 MED ORDER — IBUPROFEN 600 MG PO TABS: 600.0000 mg | ORAL_TABLET | Freq: Three times a day (TID) | ORAL | 0 refills | Status: DC | PRN

## 2021-03-05 NOTE — Patient Instructions (Signed)
Shoulder Pain Many things can cause shoulder pain, including:  An injury.  Moving the shoulder in the same way again and again (overuse).  Joint pain (arthritis). Pain can come from:  Swelling and irritation (inflammation) of any part of the shoulder.  An injury to the shoulder joint.  An injury to: ? Tissues that connect muscle to bone (tendons). ? Tissues that connect bones to each other (ligaments). ? Bones. Follow these instructions at home: Watch for changes in your symptoms. Let your doctor know about them. Follow these instructions to help with your pain. If you have a sling:  Wear the sling as told by your doctor. Remove it only as told by your doctor.  Loosen the sling if your fingers: ? Tingle. ? Become numb. ? Turn cold and blue.  Keep the sling clean.  If the sling is not waterproof: ? Do not let it get wet. ? Take the sling off when you shower or bathe. Managing pain, stiffness, and swelling  If told, put ice on the painful area: ? Put ice in a plastic bag. ? Place a towel between your skin and the bag. ? Leave the ice on for 20 minutes, 2-3 times a day. Stop putting ice on if it does not help with the pain.  Squeeze a soft ball or a foam pad as much as possible. This prevents swelling in the shoulder. It also helps to strengthen the arm.   General instructions  Take over-the-counter and prescription medicines only as told by your doctor.  Keep all follow-up visits as told by your doctor. This is important. Contact a doctor if:  Your pain gets worse.  Medicine does not help your pain.  You have new pain in your arm, hand, or fingers. Get help right away if:  Your arm, hand, or fingers: ? Tingle. ? Are numb. ? Are swollen. ? Are painful. ? Turn white or blue. Summary  Shoulder pain can be caused by many things. These include injury, moving the shoulder in the same away again and again, and joint pain.  Watch for changes in your symptoms.  Let your doctor know about them.  This condition may be treated with a sling, ice, and pain medicine.  Contact your doctor if the pain gets worse or you have new pain. Get help right away if your arm, hand, or fingers tingle or get numb, swollen, or painful.  Keep all follow-up visits as told by your doctor. This is important. This information is not intended to replace advice given to you by your health care provider. Make sure you discuss any questions you have with your health care provider. Document Revised: 04/06/2018 Document Reviewed: 04/06/2018 Elsevier Patient Education  2021 [place], Adult  Ticks are insects that can bite. Most ticks live in shrubs and grassy areas. They climb onto people and animals that go by. Then they bite. Some ticks carry germs that can make you sick. How can I prevent tick bites? Take these steps: Use insect repellent  Use an insect repellent that has 20% or higher of the ingredients DEET, picaridin, or IR3535. Follow the instructions on the label. Put it on: ? Bare skin. ? The tops of your boots. ? Your pant legs. ? The ends of your sleeves.  If you use an insect repellent that has the ingredient permethrin, follow the instructions on the label. Put it on: ? Clothing. ? Boots. ? Supplies or outdoor gear. ? Tents. When you  are outside  Wear long sleeves and long pants.  Wear light-colored clothes.  Tuck your pant legs into your socks.  Stay in the middle of the trail. Do not touch the bushes.  Avoid walking through long grass.  Check for ticks on your clothes, hair, and skin often while you are outside. Before going inside your house, check your clothes, skin, head, neck, armpits, waist, groin, and joint areas. When you go indoors  Check your clothes for ticks. Dry your clothes in a dryer on high heat for 10 minutes or more. If clothes are damp, additional time may be needed.  Wash your clothes right away if  they need to be washed. Use hot water.  Check your pets and outdoor gear.  Shower right away.  Check your body for ticks. Do a full body check using a mirror. What is the right way to remove a tick? Remove the tick from your skin as soon as possible. Do not remove the tick with your bare fingers.  To remove a tick that is crawling on your skin: ? Go outdoors and brush the tick off. ? Use tape or a lint roller.  To remove a tick that is biting: 1. Wash your hands. 2. If you have latex gloves, put them on. 3. Use tweezers, curved forceps, or a tick-removal tool to grasp the tick. Grasp the tick as close to your skin and as close to the tick's head as possible. 4. Gently pull up until the tick lets go.  Try to keep the tick's head attached to its body.  Do not twist or jerk the tick.  Do not squeeze or crush the tick. Do not try to remove a tick with heat, alcohol, petroleum jelly, or fingernail polish.   What should I do after taking out a tick?  Throw away the tick. Do not crush a tick with your fingers.  Clean the bite area and your hands with soap and water, rubbing alcohol, or an iodine wash.  If an antiseptic cream or ointment is available, apply a small amount to the bite area.  Wash and disinfect any instruments that you used to remove the tick. How should I get rid of a live tick? To dispose of a live tick, use one of these methods:  Place the tick in rubbing alcohol.  Place the tick in a bag or container you can close tightly.  Wrap the tick tightly in tape.  Flush the tick down the toilet. Contact a doctor if:  You have symptoms, such as: ? A fever or chills. ? A red rash that makes a circle (bull's-eye rash) in the bite area. ? Redness and swelling where the tick bit you. ? Headache. ? Pain in a muscle, joint, or bone. ? Being more tired than normal. ? Trouble walking or moving your legs. ? Numbness in your legs. ? Tender and swollen lymph glands.  A  part of a tick breaks off and gets stuck in your skin. Get help right away if:  You cannot remove a tick.  You cannot move (have paralysis) or feel weak.  You are feeling worse or have new symptoms.  You find a tick that is biting you and filled with blood. This is important if you are in an area where diseases from ticks are common. Summary  Ticks may carry germs that can make you sick.  To prevent tick bites wear long sleeves, long pants, and light colors. Use insect repellent. Follow  the instructions on the label.  If the tick is biting, do not try to remove it with heat, alcohol, petroleum jelly, or fingernail polish.  Use tweezers, curved forceps, or a tick-removal tool to grasp the tick. Gently pull up until the tick lets go. Do not twist or jerk the tick. Do not squeeze or crush the tick.  If you have symptoms, contact a doctor. This information is not intended to replace advice given to you by your health care provider. Make sure you discuss any questions you have with your health care provider. Document Revised: 09/19/2019 Document Reviewed: 09/19/2019 Elsevier Patient Education  2021 [employer].

## 2021-03-05 NOTE — Progress Notes (Signed)
Acute Office Visit  Subjective:    Patient ID: Samuel Wheeler, male    DOB: 1946-03-18, 75 y.o.   MRN: [id number]  Chief Complaint  Patient presents with  . Shoulder Pain  . Tick Removal    HPI Patient is in today for tick removal.  Patient is unable to tell when tick was attached to skin.  Reporting joint pain, no fever or chills, no nausea or flulike symptoms.  Patient reports this is not new as he has had Lyme disease twice in the past.  Past Medical History:  Diagnosis Date  . Actinic keratosis   . Allergy    Year Round   . Arthritis   . BPH (benign prostatic hyperplasia)   . Cancer ([place])    skin cancer - removed several times  . Cataract    Removed  . ED (erectile dysfunction)   . Hyperlipidemia     Past Surgical History:  Procedure Laterality Date  . CATARACT EXTRACTION Bilateral   . CERVICAL SPINE SURGERY    . COLONOSCOPY  2009   tics only  . COLONOSCOPY  2016   TAs  . right shoulder    . skin cancer removal     right ear x 2    Family History  Problem Relation Age of Onset  . Stroke Mother 34  . Stroke Father 28  . Diabetes Father   . COPD Father   . Heart disease Sister        "Mild heart attack", pacemaker  . Stroke Brother 37  . Syncope episode Brother   . Parkinson's disease Sister   . Stroke Sister   . Heart disease Sister   . Colon cancer Sister 67  . Colon polyps Sister   . Skin cancer Sister   . Melanoma Sister   . Skin cancer Sister   . Bone cancer Maternal Aunt   . Esophageal cancer Neg Hx   . Rectal cancer Neg Hx   . Stomach cancer Neg Hx     Social History   Socioeconomic History  . Marital status: Widowed    Spouse name: [name]  . Number of children: 6  . Years of education: Some college  . Highest education level: Not on file  Occupational History  . 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 Smoking status: Never Smoker  . Smokeless tobacco: Never Used  Vaping Use  .  Vaping Use: Never used  Substance and Sexual Activity  . Alcohol use: No  . Drug use: No  . Sexual activity: Yes    Birth control/protection: None  Other Topics Concern  . Not on file  Social History Narrative   Lives alone  - wife passed away 12-11-20.      He has six step children, 1 passed aware -prostate cancer age 70 - several are local       Caffeine use: 2 cups coffee per day    Right handed    Social Determinants of Health   Financial Resource Strain: Not on file  Food Insecurity: Not on file  Transportation Needs: Not on file  Physical Activity: Not on file  Stress: Not on file  Social Connections: Not on file  Intimate Partner Violence: Not on file    Outpatient Medications Prior to Visit  Medication Sig Dispense Refill  . albuterol (PROAIR HFA) 108 (90 Base) MCG/ACT inhaler USE 2 INHALATIONS EVERY 6 HOURS AS NEEDED  FOR WHEEZING OR SHORTNESS OF BREATH 51 g 3  . aspirin 81 MG chewable tablet Chew 81 mg by mouth daily.    Marland Kitchen atorvastatin (LIPITOR) 20 MG tablet TAKE 1 TABLET DAILY 90 tablet 3  . cetirizine (ZYRTEC) 10 MG tablet TAKE 1 TABLET DAILY 90 tablet 3  . finasteride (PROSCAR) 5 MG tablet TAKE 1 TABLET DAILY 90 tablet 3  . fluticasone (FLONASE) 50 MCG/ACT nasal spray USE 2 SPRAYS IN EACH NOSTRIL DAILY 48 g 3  . naproxen sodium (ALEVE) 220 MG tablet Take 220 mg by mouth daily.    . niacin (NIASPAN) 1000 MG CR tablet TAKE 1 TABLET AT BEDTIME 90 tablet 0  . tamsulosin (FLOMAX) 0.4 MG CAPS capsule Take 0.4 mg by mouth at bedtime.    . valACYclovir (VALTREX) 1000 MG tablet TAKE 1 TABLET DAILY 90 tablet 0  . Vitamin D, Ergocalciferol, (DRISDOL) 1.25 MG (50000 UNIT) CAPS capsule Take 1 capsule (50,000 Units total) by mouth every 7 (seven) days. 12 capsule 2   No facility-administered medications prior to visit.    Allergies  Allergen Reactions  . Poison Ivy Extract     Review of Systems  Constitutional: Negative.   HENT: Negative.   Respiratory: Negative.    Cardiovascular: Negative.   Skin: Positive for color change.  All other systems reviewed and are negative.      Objective:    Physical Exam Vitals and nursing note reviewed.  Constitutional:      Appearance: Normal appearance.  HENT:     Head: Normocephalic.     Nose: Nose normal.  Eyes:     Conjunctiva/sclera: Conjunctivae normal.  Pulmonary:     Effort: Pulmonary effort is normal.     Breath sounds: Normal breath sounds.  Abdominal:     General: Bowel sounds are normal.  Skin:    Findings: Erythema and rash present.  Neurological:     Mental Status: He is alert and oriented to person, place, and time.  Psychiatric:        Behavior: Behavior normal.     BP 138/84   Pulse 74   Temp 97.9 F (36.6 C) (Temporal)   Ht _0  (1.778 m)   Wt 203 lb (92.1 kg)   SpO2 96%   BMI 29.13 kg/m  Wt Readings from Last 3 Encounters:  03/05/21 203 lb (92.1 kg)  12/18/20 202 lb (91.6 kg)  12/04/20 202 lb (91.6 kg)    Health Maintenance Due  Topic Date Due  . COVID-19 Vaccine (4 - Booster for Moderna series) 11/14/2020    There are no preventive care reminders to display for this patient.   Lab Results  Component Value Date   TSH 3.950 03/08/2019   Lab Results  Component Value Date   WBC 7.3 12/04/2020   HGB 15.1 12/04/2020   HCT 44.9 12/04/2020   MCV 89 12/04/2020   PLT 294 12/04/2020   Lab Results  Component Value Date   NA 140 12/04/2020   K 4.7 12/04/2020   CO2 23 12/04/2020   GLUCOSE 98 12/04/2020   BUN 20 12/04/2020   CREATININE 0.93 12/04/2020   BILITOT 0.4 12/04/2020   ALKPHOS 93 12/04/2020   AST 17 12/04/2020   ALT 21 12/04/2020   PROT 7.2 12/04/2020   ALBUMIN 4.3 12/04/2020   CALCIUM 9.0 12/04/2020   EGFR 87 12/04/2020   Lab Results  Component Value Date   CHOL 150 12/04/2020   Lab Results  Component Value Date  HDL 52 12/04/2020   Lab Results  Component Value Date   LDLCALC 84 12/04/2020   Lab Results  Component Value Date    TRIG 74 12/04/2020   Lab Results  Component Value Date   CHOLHDL 2.9 12/04/2020   No results found for: HGBA1C     Assessment & Plan:   Problem List Items Addressed This Visit      Musculoskeletal and Integument   Tick bite of back - Primary    Patient removed tick but not completely, cleaned area with alcohol, tick completely removed.  Patient tolerated procedure well.  Doxycycline prophylaxis ordered, education provided to patient with printed handouts given.  Labs for Lyme ABS/Western blot put in for 2 to 3 weeks from today.  Advised patient to follow-up with flulike symptoms.   Rx sent to pharmacy.      Relevant Medications   doxycycline (VIBRA-TABS) 100 MG tablet   Other Relevant Orders   Lyme Disease Serology w/Reflex     Other   Acute pain of left shoulder   Relevant Medications   ibuprofen (ADVIL) 600 MG tablet   predniSONE (STERAPRED UNI-PAK 21 TAB) 10 MG (21) TBPK tablet   cyclobenzaprine (FLEXERIL) 5 MG tablet       Meds ordered this encounter  Medications  . doxycycline (VIBRA-TABS) 100 MG tablet    Sig: Take 1 tablet (100 mg total) by mouth 2 (two) times daily.    Dispense:  2 tablet    Refill:  0    Order Specific Question:   Supervising Provider    Answer:   Janora Norlander [[id number]]  . ibuprofen (ADVIL) 600 MG tablet    Sig: Take 1 tablet (600 mg total) by mouth every 8 (eight) hours as needed.    Dispense:  30 tablet    Refill:  0    Order Specific Question:   Supervising Provider    Answer:   Janora Norlander [[id number]]  . predniSONE (STERAPRED UNI-PAK 21 TAB) 10 MG (21) TBPK tablet    Sig: 6 tablet day 1, 5 day 2, 4 day 3, 3 day 4 , 2 day 5, 1 day  6    Dispense:  1 each    Refill:  0    Order Specific Question:   Supervising Provider    Answer:   Janora Norlander [[id number]]  . cyclobenzaprine (FLEXERIL) 5 MG tablet    Sig: Take 1 tablet (5 mg total) by mouth 3 (three) times daily as needed for muscle spasms.    Dispense:  30 tablet     Refill:  1    Order Specific Question:   Supervising Provider    Answer:   Janora Norlander [[id number]]     Ivy Lynn, NP

## 2021-03-05 NOTE — Assessment & Plan Note (Signed)
Patient removed tick but not completely, cleaned area with alcohol, tick completely removed.  Patient tolerated procedure well.  Doxycycline prophylaxis ordered, education provided to patient with printed handouts given.  Labs for Lyme ABS/Western blot put in for 2 to 3 weeks from today.  Advised patient to follow-up with flulike symptoms.   Rx sent to pharmacy.

## 2021-03-19 ENCOUNTER — Other Ambulatory Visit: Payer: Self-pay

## 2021-03-19 ENCOUNTER — Other Ambulatory Visit: Payer: Medicare Other

## 2021-03-19 DIAGNOSIS — S30860A Insect bite (nonvenomous) of lower back and pelvis, initial encounter: Secondary | ICD-10-CM

## 2021-03-20 LAB — LYME DISEASE SEROLOGY W/REFLEX: Lyme Total Antibody EIA: NEGATIVE

## 2021-03-22 ENCOUNTER — Other Ambulatory Visit: Payer: Self-pay | Admitting: Family Medicine

## 2021-03-22 DIAGNOSIS — E559 Vitamin D deficiency, unspecified: Secondary | ICD-10-CM

## 2021-03-25 ENCOUNTER — Other Ambulatory Visit: Payer: Self-pay | Admitting: Family Medicine

## 2021-04-04 DIAGNOSIS — H40033 Anatomical narrow angle, bilateral: Secondary | ICD-10-CM | POA: Diagnosis not present

## 2021-04-04 DIAGNOSIS — H2513 Age-related nuclear cataract, bilateral: Secondary | ICD-10-CM | POA: Diagnosis not present

## 2021-04-22 ENCOUNTER — Encounter: Payer: Self-pay | Admitting: Family

## 2021-04-22 ENCOUNTER — Ambulatory Visit (INDEPENDENT_AMBULATORY_CARE_PROVIDER_SITE_OTHER): Payer: Medicare Other | Admitting: Family

## 2021-04-22 ENCOUNTER — Other Ambulatory Visit: Payer: Self-pay | Admitting: Family

## 2021-04-22 ENCOUNTER — Other Ambulatory Visit: Payer: Self-pay | Admitting: Family Medicine

## 2021-04-22 DIAGNOSIS — A6000 Herpesviral infection of urogenital system, unspecified: Secondary | ICD-10-CM

## 2021-04-22 DIAGNOSIS — U071 COVID-19: Secondary | ICD-10-CM | POA: Diagnosis not present

## 2021-04-22 MED ORDER — ALBUTEROL SULFATE HFA 108 (90 BASE) MCG/ACT IN AERS
2.0000 | INHALATION_SPRAY | Freq: Four times a day (QID) | RESPIRATORY_TRACT | 0 refills | Status: DC | PRN
Start: 2021-04-22 — End: 2021-06-24

## 2021-04-22 MED ORDER — MOLNUPIRAVIR EUA 200MG CAPSULE: 4.0000 | ORAL_CAPSULE | Freq: Two times a day (BID) | ORAL | 0 refills | Status: AC

## 2021-04-22 NOTE — Progress Notes (Signed)
Virtual Visit  Note Due to COVID-19 pandemic this visit was conducted virtually. This visit type was conducted due to national recommendations for restrictions regarding the COVID-19 Pandemic (e.g. social distancing, sheltering in place) in an effort to limit this patient's exposure and mitigate transmission in our community. All issues noted in this document were discussed and addressed.  A physical exam was not performed with this format.  I connected with Pilar Grammes on 04/22/21 at 8:26 AM  by telephone and verified that I am speaking with the correct person using two identifiers. Samuel Wheeler is currently located at home and no one is currently with him  during visit. The provider, Evelina Dun, FNP is located in their office at time of visit.  I discussed the limitations, risks, security and privacy concerns of performing an evaluation and management service by telephone and the availability of in person appointments. I also discussed with the patient that there may be a patient responsible charge related to this service. The patient expressed understanding and agreed to proceed.  [name], baines are scheduled for a virtual visit with your provider today.    Just as we do with appointments in the office, we must obtain your consent to participate.  Your consent will be active for this visit and any virtual visit you may have with one of our providers in the next 365 days.    If you have a MyChart account, I can also send a copy of this consent to you electronically.  All virtual visits are billed to your insurance company just like a traditional visit in the office.  As this is a virtual visit, video technology does not allow for your provider to perform a traditional examination.  This may limit your provider's ability to fully assess your condition.  If your provider identifies any concerns that need to be evaluated in person or the need to arrange testing such as labs, EKG, etc, we will  make arrangements to do so.    Although advances in technology are sophisticated, we cannot ensure that it will always work on either your end or our end.  If the connection with a video visit is poor, we may have to switch to a telephone visit.  With either a video or telephone visit, we are not always able to ensure that we have a secure connection.   I need to obtain your verbal consent now.   Are you willing to proceed with your visit today?   JAHLANI LORENTZ has provided verbal consent on 04/22/2021 for a virtual visit (video or telephone).   Evelina Dun, [place] 04/22/2021  8:28 AM    History and Present Illness:  Pt presents today with complaints of headache, congestion, and fever that started two days ago. He took an at home COVID test that was positive yesterday.  Cough This is a new problem. The current episode started in the past 7 days. The problem has been gradually worsening. The problem occurs every few minutes. The cough is Non-productive. Associated symptoms include a fever, headaches, myalgias, nasal congestion, postnasal drip, rhinorrhea, a sore throat and wheezing. Pertinent negatives include no chills, ear congestion, ear pain or shortness of breath. The symptoms are aggravated by lying down. He has tried rest for the symptoms. The treatment provided no relief.     Review of Systems  Constitutional:  Positive for fever. Negative for chills.  HENT:  Positive for postnasal drip, rhinorrhea and sore throat. Negative for ear  pain.   Respiratory:  Positive for cough and wheezing. Negative for shortness of breath.   Musculoskeletal:  Positive for myalgias.  Neurological:  Positive for headaches.  All other systems reviewed and are negative.   Observations/Objective: Nasal congestion, hoarse voice, No SOB or wheezing noted.   Assessment and Plan: 1. COVID-19 virus detected COVID positive, rest, force fluids, tylenol as needed, Quarantine for at least 5 days and fever free,  report any worsening symptoms such as increased shortness of breath, swelling, or continued high fevers.  Discussed possible adverse effects of molnupiravir  - molnupiravir EUA 200 mg CAPS; Take 4 capsules (800 mg total) by mouth 2 (two) times daily for 5 days.  Dispense: 40 capsule; Refill: 0 - albuterol (VENTOLIN HFA) 108 (90 Base) MCG/ACT inhaler; Inhale 2 puffs into the lungs every 6 (six) hours as needed for wheezing or shortness of breath.  Dispense: 8 g; Refill: 0    I discussed the assessment and treatment plan with the patient. The patient was provided an opportunity to ask questions and all were answered. The patient agreed with the plan and demonstrated an understanding of the instructions.   The patient was advised to call back or seek an in-person evaluation if the symptoms worsen or if the condition fails to improve as anticipated.  The above assessment and management plan was discussed with the patient. The patient verbalized understanding of and has agreed to the management plan. Patient is aware to call the clinic if symptoms persist or worsen. Patient is aware when to return to the clinic for a follow-up visit. Patient educated on when it is appropriate to go to the emergency department.   Time call ended:  8:38 AM   I provided 12 minutes of  non face-to-face time during this encounter.    Evelina Dun, FNP

## 2021-05-10 DIAGNOSIS — H35372 Puckering of macula, left eye: Secondary | ICD-10-CM | POA: Diagnosis not present

## 2021-05-10 DIAGNOSIS — Z961 Presence of intraocular lens: Secondary | ICD-10-CM | POA: Diagnosis not present

## 2021-05-10 DIAGNOSIS — Z01818 Encounter for other preprocedural examination: Secondary | ICD-10-CM | POA: Diagnosis not present

## 2021-05-10 DIAGNOSIS — H2512 Age-related nuclear cataract, left eye: Secondary | ICD-10-CM | POA: Diagnosis not present

## 2021-05-10 DIAGNOSIS — H2511 Age-related nuclear cataract, right eye: Secondary | ICD-10-CM | POA: Diagnosis not present

## 2021-05-16 DIAGNOSIS — N401 Enlarged prostate with lower urinary tract symptoms: Secondary | ICD-10-CM | POA: Diagnosis not present

## 2021-05-16 DIAGNOSIS — R3912 Poor urinary stream: Secondary | ICD-10-CM | POA: Diagnosis not present

## 2021-05-17 DIAGNOSIS — H25812 Combined forms of age-related cataract, left eye: Secondary | ICD-10-CM | POA: Diagnosis not present

## 2021-05-17 DIAGNOSIS — H2512 Age-related nuclear cataract, left eye: Secondary | ICD-10-CM | POA: Diagnosis not present

## 2021-05-24 DIAGNOSIS — H43393 Other vitreous opacities, bilateral: Secondary | ICD-10-CM | POA: Diagnosis not present

## 2021-06-03 ENCOUNTER — Other Ambulatory Visit: Payer: Self-pay | Admitting: Family Medicine

## 2021-06-03 DIAGNOSIS — E782 Mixed hyperlipidemia: Secondary | ICD-10-CM

## 2021-06-06 ENCOUNTER — Encounter: Payer: Self-pay | Admitting: Family Medicine

## 2021-06-06 ENCOUNTER — Other Ambulatory Visit: Payer: Self-pay

## 2021-06-06 ENCOUNTER — Ambulatory Visit (INDEPENDENT_AMBULATORY_CARE_PROVIDER_SITE_OTHER): Payer: Medicare Other | Admitting: Family Medicine

## 2021-06-06 VITALS — BP 139/79 | HR 70 | Temp 97.3°F | Ht 70.0 in | Wt 211.4 lb

## 2021-06-06 DIAGNOSIS — N400 Enlarged prostate without lower urinary tract symptoms: Secondary | ICD-10-CM | POA: Diagnosis not present

## 2021-06-06 DIAGNOSIS — E559 Vitamin D deficiency, unspecified: Secondary | ICD-10-CM | POA: Diagnosis not present

## 2021-06-06 DIAGNOSIS — M1990 Unspecified osteoarthritis, unspecified site: Secondary | ICD-10-CM

## 2021-06-06 DIAGNOSIS — E782 Mixed hyperlipidemia: Secondary | ICD-10-CM

## 2021-06-06 DIAGNOSIS — A6000 Herpesviral infection of urogenital system, unspecified: Secondary | ICD-10-CM

## 2021-06-06 DIAGNOSIS — G8929 Other chronic pain: Secondary | ICD-10-CM

## 2021-06-06 DIAGNOSIS — M25512 Pain in left shoulder: Secondary | ICD-10-CM | POA: Diagnosis not present

## 2021-06-06 MED ORDER — METHOCARBAMOL 500 MG PO TABS: 500.0000 mg | ORAL_TABLET | Freq: Three times a day (TID) | ORAL | 1 refills | Status: DC | PRN

## 2021-06-06 NOTE — Progress Notes (Signed)
Assessment & Plan:  1. Mixed hyperlipidemia - Well controlled on current regimen - continue atorvastatin as prescribed - CBC with Differential/Platelet - CMP14+EGFR - Lipid panel  2. Benign prostatic hyperplasia, unspecified whether lower urinary tract symptoms present - Well controlled on current regimen - continue flomax as prescribed - CMP14+EGFR  3. Genital herpes simplex, unspecified site -Well controlled on current regimen - CMP14+EGFR  4. Vitamin D deficiency - Well controlled on current regimen.  - continue vitamin D supplement once weekly  5. Chronic left shoulder pain - continue naproxen as needed for arthritis pain - ambulatory referral to physical therapy - added Robaxin as muscle relaxers have helped previously - methocarbamol (ROBAXIN) 500 MG tablet; Take 1 tablet (500 mg total) by mouth every 8 (eight) hours as needed for muscle spasms.  Dispense: 30 tablet; Refill: 1  6. Arthritis - continue naproxen as needed for arthritis pain - education provided on arthritis    Return in about 6 months (around 12/04/2021) for follow-up of chronic medication conditions.  Lucile Crater, NP Student  I personally was present during the history, physical exam, and medical decision-making activities of this service and have verified that the service and findings are accurately documented in the nurse practitioner student's note.  Hendricks Limes, MSN, APRN, FNP-C [facility]   Subjective:    Patient ID: Samuel Wheeler, Samuel Wheeler    DOB: 03-29-1946, 75 y.o.   MRN: [id number]  Patient Care Team: Loman Brooklyn, FNP as PCP - General (Family Medicine) Minus Breeding, MD as Consulting Physician (Cardiology) Ladene Artist, MD as Consulting Physician (Gastroenterology) Sandford Craze, MD as Referring Physician (Unknown Physician Specialty) Latanya Maudlin, MD as Consulting Physician (Orthopedic Surgery) Harlen Labs, MD as Referring Physician  (Optometry)   Chief Complaint:  Chief Complaint  Patient presents with   Hyperlipidemia    6 month follow up of chronic medical conditions    Shoulder Pain    Left shoulder pain that is on going     HPI: Samuel Wheeler is a 75 y.o. Samuel Wheeler presenting on 06/06/2021 for Hyperlipidemia (6 month follow up of chronic medical conditions ) and Shoulder Pain (Left shoulder pain that is on going )  He states the initial shoulder injury occurred 3 months ago while he was mowing and pulled back on the mower abruptly. He was treated with prednisone, flexeril, and naproxen. He stated that he was able to get full mobility back in this shoulder, but the pain still lingers when he moves his arm across the midline, primarily in his deltoid muscle. He is willing to try physical therapy.   He is tolerating his statin but states that his diet has changed since his wife died several months ago. He has been eating more often and less healthy.   He states he is having arthritis pain in his knuckles but that the naproxen has been helping.  New complaints: None   Social history:  Relevant past medical, surgical, family and social history reviewed and updated as indicated. Interim medical history since our last visit reviewed.  Allergies and medications reviewed and updated.  DATA REVIEWED: CHART IN EPIC  ROS: Negative unless specifically indicated above in HPI.    Current Outpatient Medications:    albuterol (VENTOLIN HFA) 108 (90 Base) MCG/ACT inhaler, Inhale 2 puffs into the lungs every 6 (six) hours as needed for wheezing or shortness of breath., Disp: 8 g, Rfl: 0   atorvastatin (LIPITOR) 20 MG tablet, TAKE  1 TABLET DAILY, Disp: 90 tablet, Rfl: 3   cetirizine (ZYRTEC) 10 MG tablet, TAKE 1 TABLET DAILY, Disp: 90 tablet, Rfl: 3   finasteride (PROSCAR) 5 MG tablet, TAKE 1 TABLET DAILY, Disp: 90 tablet, Rfl: 3   fluticasone (FLONASE) 50 MCG/ACT nasal spray, USE 2 SPRAYS IN EACH NOSTRIL DAILY, Disp: 48 g,  Rfl: 3   methocarbamol (ROBAXIN) 500 MG tablet, Take 1 tablet (500 mg total) by mouth every 8 (eight) hours as needed for muscle spasms., Disp: 30 tablet, Rfl: 1   naproxen sodium (ALEVE) 220 MG tablet, Take 220 mg by mouth daily., Disp: , Rfl:    niacin (NIASPAN) 1000 MG CR tablet, TAKE 1 TABLET AT BEDTIME, Disp: 90 tablet, Rfl: 0   valACYclovir (VALTREX) 1000 MG tablet, TAKE 1 TABLET DAILY, Disp: 90 tablet, Rfl: 0   Vitamin D, Ergocalciferol, (DRISDOL) 1.25 MG (50000 UNIT) CAPS capsule, TAKE 1 CAPSULE EVERY 7 DAYS, Disp: 12 capsule, Rfl: 3   Allergies  Allergen Reactions   Poison Ivy Extract    Past Medical History:  Diagnosis Date   Actinic keratosis    Allergy    Year Round    Arthritis    BPH (benign prostatic hyperplasia)    Cancer ([place])    skin cancer - removed several times   Cataract    Removed   ED (erectile dysfunction)    Hyperlipidemia     Past Surgical History:  Procedure Laterality Date   CATARACT EXTRACTION Bilateral    CERVICAL SPINE SURGERY     COLONOSCOPY  2009   tics only   COLONOSCOPY  2016   TAs   EYE SURGERY     right shoulder     skin cancer removal     right ear x 2    Social History   Socioeconomic History   Marital status: Widowed    Spouse name: [name]   Number of children: 6   Years of education: Some college   Highest education level: Not on file  Occupational History   Occupation: Retired Psychologist, 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      He has six step children, 1 passed aware -prostate cancer age 64 - several are local       Caffeine use: 2 cups coffee per day    Right handed    Social  Determinants of Health   Financial Resource Strain: Not on file  Food Insecurity: Not on file  Transportation Needs: Not on file  Physical Activity: Not on file  Stress: Not on file  Social Connections: Not on file  Intimate Partner Violence: Not on file        Objective:    BP 139/79   Pulse 70   Temp (!) 97.3 F (36.3 C) (Temporal)   Ht '5\' 10"'  (1.778 m)   Wt 95.9 kg   SpO2 94%   BMI 30.33 kg/m   Wt Readings from Last 3 Encounters:  06/06/21 211 lb 6.4 oz (95.9 kg)  03/05/21 203 lb (92.1 kg)  12/18/20 202 lb (91.6 kg)  Physical Exam Vitals reviewed.  Constitutional:      General: He is not in acute distress.    Appearance: Normal appearance. He is obese. He is not ill-appearing, toxic-appearing or diaphoretic.  HENT:     Head: Normocephalic and atraumatic.     Right Ear: Tympanic membrane, ear canal and external ear normal. There is no impacted cerumen.     Left Ear: Tympanic membrane, ear canal and external ear normal. There is no impacted cerumen.     Nose: Nose normal. No congestion or rhinorrhea.     Mouth/Throat:     Mouth: Mucous membranes are moist.     Pharynx: Oropharynx is clear. No oropharyngeal exudate or posterior oropharyngeal erythema.  Eyes:     General: No scleral icterus.       Right eye: No discharge.        Left eye: No discharge.     Conjunctiva/sclera: Conjunctivae normal.     Pupils: Pupils are equal, round, and reactive to light.  Neck:     Vascular: No carotid bruit.  Cardiovascular:     Rate and Rhythm: Normal rate and regular rhythm.     Heart sounds: Normal heart sounds. No murmur heard.   No friction rub. No gallop.  Pulmonary:     Effort: Pulmonary effort is normal. No respiratory distress.     Breath sounds: Normal breath sounds. No stridor. No wheezing, rhonchi or rales.  Abdominal:     General: Abdomen is flat. Bowel sounds are normal. There is no distension.     Palpations: Abdomen is soft. There is no mass.      Tenderness: There is no abdominal tenderness. There is no guarding or rebound.     Hernia: No hernia is present.  Musculoskeletal:        General: Normal range of motion.     Left shoulder: Tenderness present. No swelling, deformity, effusion, laceration or bony tenderness. Normal range of motion. Normal strength.     Cervical back: Normal range of motion and neck supple. No rigidity. No muscular tenderness.     Right lower leg: No edema.     Left lower leg: No edema.  Lymphadenopathy:     Cervical: No cervical adenopathy.  Skin:    General: Skin is warm and dry.     Capillary Refill: Capillary refill takes less than 2 seconds.  Neurological:     General: No focal deficit present.     Mental Status: He is alert and oriented to person, place, and time. Mental status is at baseline.  Psychiatric:        Mood and Affect: Mood normal.        Behavior: Behavior normal.        Thought Content: Thought content normal.        Judgment: Judgment normal.    Lab Results  Component Value Date   TSH 3.950 03/08/2019   Lab Results  Component Value Date   WBC 7.3 12/04/2020   HGB 15.1 12/04/2020   HCT 44.9 12/04/2020   MCV 89 12/04/2020   PLT 294 12/04/2020   Lab Results  Component Value Date   NA 140 12/04/2020   K 4.7 12/04/2020   CO2 23 12/04/2020   GLUCOSE 98 12/04/2020   BUN 20 12/04/2020   CREATININE 0.93 12/04/2020   BILITOT 0.4 12/04/2020   ALKPHOS 93 12/04/2020   AST 17 12/04/2020   ALT 21 12/04/2020   PROT 7.2 12/04/2020   ALBUMIN 4.3  12/04/2020   CALCIUM 9.0 12/04/2020   EGFR 87 12/04/2020   Lab Results  Component Value Date   CHOL 150 12/04/2020   Lab Results  Component Value Date   HDL 52 12/04/2020   Lab Results  Component Value Date   LDLCALC 84 12/04/2020   Lab Results  Component Value Date   TRIG 74 12/04/2020   Lab Results  Component Value Date   CHOLHDL 2.9 12/04/2020   No results found for: HGBA1C

## 2021-06-06 NOTE — Patient Instructions (Signed)
Reminder: please return after mid-September for your flu shot. If you receive this elsewhere, such as your pharmacy, please let us know so we can get this documented in your chart.   

## 2021-06-07 LAB — CMP14+EGFR
ALT: 19 IU/L (ref 0–44)
AST: 16 IU/L (ref 0–40)
Albumin/Globulin Ratio: 1.7 (ref 1.2–2.2)
Albumin: 4.3 g/dL (ref 3.7–4.7)
Alkaline Phosphatase: 72 IU/L (ref 44–121)
BUN/Creatinine Ratio: 21 (ref 10–24)
BUN: 18 mg/dL (ref 8–27)
Bilirubin Total: 0.7 mg/dL (ref 0.0–1.2)
CO2: 25 mmol/L (ref 20–29)
Calcium: 9.1 mg/dL (ref 8.6–10.2)
Chloride: 104 mmol/L (ref 96–106)
Creatinine, Ser: 0.86 mg/dL (ref 0.76–1.27)
Globulin, Total: 2.5 g/dL (ref 1.5–4.5)
Glucose: 103 mg/dL — ABNORMAL HIGH (ref 65–99)
Potassium: 4.5 mmol/L (ref 3.5–5.2)
Sodium: 142 mmol/L (ref 134–144)
Total Protein: 6.8 g/dL (ref 6.0–8.5)
eGFR: 91 mL/min/{1.73_m2} (ref >59)

## 2021-06-07 LAB — LIPID PANEL
Chol/HDL Ratio: 3.5 ratio (ref 0.0–5.0)
Cholesterol, Total: 164 mg/dL (ref 100–199)
HDL: 47 mg/dL (ref >39)
LDL Chol Calc (NIH): 97 mg/dL (ref 0–99)
Triglycerides: 111 mg/dL (ref 0–149)
VLDL Cholesterol Cal: 20 mg/dL (ref 5–40)

## 2021-06-07 LAB — CBC WITH DIFFERENTIAL/PLATELET
Basophils Absolute: 0 10*3/uL (ref 0.0–0.2)
Basos: 1 %
EOS (ABSOLUTE): 0.3 10*3/uL (ref 0.0–0.4)
Eos: 4 %
Hematocrit: 41.6 % (ref 37.5–51.0)
Hemoglobin: 14.6 g/dL (ref 13.0–17.7)
Immature Grans (Abs): 0 10*3/uL (ref 0.0–0.1)
Immature Granulocytes: 0 %
Lymphocytes Absolute: 2.8 10*3/uL (ref 0.7–3.1)
Lymphs: 36 %
MCH: 31.1 pg (ref 26.6–33.0)
MCHC: 35.1 g/dL (ref 31.5–35.7)
MCV: 89 fL (ref 79–97)
Monocytes Absolute: 0.6 10*3/uL (ref 0.1–0.9)
Monocytes: 7 %
Neutrophils Absolute: 4 10*3/uL (ref 1.4–7.0)
Neutrophils: 52 %
Platelets: 227 10*3/uL (ref 150–450)
RBC: 4.7 x10E6/uL (ref 4.14–5.80)
RDW: 13 % (ref 11.6–15.4)
WBC: 7.8 10*3/uL (ref 3.4–10.8)

## 2021-06-14 DIAGNOSIS — H04123 Dry eye syndrome of bilateral lacrimal glands: Secondary | ICD-10-CM | POA: Diagnosis not present

## 2021-06-18 ENCOUNTER — Ambulatory Visit: Payer: Medicare Other | Attending: Family Medicine | Admitting: Physical Therapy

## 2021-06-18 ENCOUNTER — Other Ambulatory Visit: Payer: Self-pay

## 2021-06-18 DIAGNOSIS — M25612 Stiffness of left shoulder, not elsewhere classified: Secondary | ICD-10-CM | POA: Insufficient documentation

## 2021-06-18 DIAGNOSIS — M25512 Pain in left shoulder: Secondary | ICD-10-CM | POA: Insufficient documentation

## 2021-06-18 DIAGNOSIS — M6281 Muscle weakness (generalized): Secondary | ICD-10-CM | POA: Diagnosis not present

## 2021-06-18 NOTE — Therapy (Signed)
[facility] [place], [place], [address] Phone: [phone]   Fax:  [phone]  Physical Therapy Evaluation  Patient Details  Name: Samuel Wheeler MRN: [id number] Date of Birth: November 29, 1945 Referring Provider (PT): Hendricks Limes   Encounter Date: 06/18/2021   PT End of Session - 06/18/21 1320     Visit Number 1    Number of Visits 12    Date for PT Re-Evaluation 07/30/21    PT Start Time 0100    PT Stop Time 0142    PT Time Calculation (min) 42 min             Past Medical History:  Diagnosis Date   Actinic keratosis    Allergy    Year Round    Arthritis    BPH (benign prostatic hyperplasia)    Cancer ([place])    skin cancer - removed several times   Cataract    Removed   ED (erectile dysfunction)    Hyperlipidemia     Past Surgical History:  Procedure Laterality Date   CATARACT EXTRACTION Bilateral    CERVICAL SPINE SURGERY     COLONOSCOPY  2009   tics only   COLONOSCOPY  2016   TAs   EYE SURGERY     right shoulder     skin cancer removal     right ear x 2    There were no vitals filed for this visit.    Subjective Assessment - 06/18/21 1321     Subjective COVID-19 screen performed prior to patient entering clinic.  The patient presenst to the clinic with c/o left shoulderpain that has been ongoing for about three months.  He reports he was on a mower when he hit something andthe steering wheel jerked hard and pulled his left shoulder.  This produced intense pain.  He reports he was unable to lift his arm but has been working it but is now able to do so with pain that radiates down his arm.  His pain today is a 6/10 but can become quite severe with certain movements.  Heat, cold and rest decreas ehispain.    Pertinent History Cervical surgery, right shoulder RTC, OA.    Patient Stated Goals Use left UE without pain.    Currently in Pain? Yes    Pain Score 6     Pain Location Shoulder    Pain  Orientation Left    Pain Descriptors / Indicators Shooting    Pain Radiating Towards Down left UE.    Pain Onset More than a month ago    Pain Frequency Constant    Aggravating Factors  See above.    Pain Relieving Factors See above.                [facility] PT Assessment - 06/18/21 0001       Assessment   Medical Diagnosis Left shoulder pain.    Referring Provider (PT) Hendricks Limes    Onset Date/Surgical Date --   ~3 months ago.     Precautions   Precautions None      Restrictions   Weight Bearing Restrictions No      Balance Screen   Has the patient fallen in the past 6 months No    Has the patient had a decrease in activity level because of a fear of falling?  No    Is the patient reluctant to leave their home because of a fear of falling?  No  [place] residence      Prior Function   Level of Independence Independent      Observation/Other Assessments   Observations Atrophy below the spine of the left scapula in the region of the left posterior cuff musculature.      Posture/Postural Control   Posture/Postural Control Postural limitations    Postural Limitations Forward head      Deep Tendon Reflexes   DTR Assessment Site Biceps;Brachioradialis;Triceps    Biceps DTR --   LT absent.   Brachioradialis DTR --   LT is normal.   Triceps DTR --   Left is absent.     ROM / Strength   AROM / PROM / Strength AROM;Strength      AROM   Overall AROM Comments Ful active left shoulder flexion but a painful arc.  ER is limited to 70 degrees.  Behind back to L4.      Strength   Overall Strength Comments Right shoulder flexion and abduction is 4+/5, ER is 3 to 3+/5.      Palpation   Palpation comment Tender to palpation over left acromial ridge, posterior cuff medial deltoid and referred pain down his lateral shoulder.      Special Tests   Other special tests (-) Drop Arm test.  Hawkins-Neer test.                         Objective measurements completed on examination: See above findings.       [facility] Adult PT Treatment/Exercise - 06/18/21 0001       Modalities   Modalities Electrical Stimulation;Vasopneumatic      Electrical Stimulation   Electrical Stimulation Location Left shoulder.    Electrical Stimulation Action IFC at 80-150 Hz.    Electrical Stimulation Parameters 40% scan x 15 minutes.    Electrical Stimulation Goals Pain      Vasopneumatic   Number Minutes Vasopneumatic  15 minutes    Vasopnuematic Location  --   Left shoulder with pillow between elbow and thorax.   Vasopneumatic Pressure Low                          PT Long Term Goals - 06/18/21 1419       PT LONG TERM GOAL #1   Title Independent with a HEP.    Time 6    Period Weeks    Status New      PT LONG TERM GOAL #2   Title Left shoulder ER strength to 4/5 to allow patient greater ease of performing fucntional tasks.    Time 6    Period Weeks    Status New      PT LONG TERM GOAL #3   Title Perform ADL's with pain not > 3/10.    Time 6    Period Weeks    Status New                    Plan - 06/18/21 1333     Clinical Impression Statement The patient presents to OPPT wiht c/o left shoulder pain that has been ongoing for about three months due to the sterring wheel of his mower jerking his arm.  He has full antigravity flexion but has a very painful arc.  His left shoulder ER is limited and is weak.  He experiences referred pain into his left UE with certain movements  of his left shoulder.  He demonstrtaes a positive Hawkins-Neer test.  He has muscle atrophy in the region of the posterior cuff.  He is tender to palpation along his acromial region, middle deltoid and posterior cuff musculature.Patient will benefit from skilled physical therapy intervention to address pain and deficits.    Personal Factors and Comorbidities Comorbidity 1    Comorbidities Cervical  surgery, right shoulder RTC, OA.    Examination-Activity Limitations Reach Overhead;Other    Examination-Participation Restrictions Other    Stability/Clinical Decision Making Stable/Uncomplicated    Clinical Decision Making Low    Rehab Potential Good    PT Frequency 2x / week    PT Duration 6 weeks    PT Treatment/Interventions ADLs/Self Care Home Management;Cryotherapy;Electrical Stimulation;Ultrasound;Moist Heat;Therapeutic activities;Therapeutic exercise;Manual techniques;Patient/family education;Passive range of motion;Dry needling;Vasopneumatic Device    PT Next Visit Plan UBE, AAROM, work on improving left shoulder ER strength.  RW4, may have to start with isometrics for ER.  Modalities and STW/M as needed.    Consulted and Agree with Plan of Care Patient             Patient will benefit from skilled therapeutic intervention in order to improve the following deficits and impairments:  Decreased activity tolerance, Pain, Decreased range of motion, Decreased strength  Visit Diagnosis: Acute pain of left shoulder - Plan: PT plan of care cert/re-cert  Stiffness of left shoulder, not elsewhere classified - Plan: PT plan of care cert/re-cert  Muscle weakness (generalized) - Plan: PT plan of care cert/re-cert     Problem List Patient Active Problem List   Diagnosis Date Noted   Arthritis 06/06/2021   Chronic left shoulder pain 03/05/2021   Vitamin D deficiency 05/16/2020   Seasonal allergies 05/16/2020   Benign positional vertigo 08/25/2017   PVC's (premature ventricular contractions) 11/01/2014   Neoplasm of skin of ear 08/05/2013   HLD (hyperlipidemia) 01/27/2013   BPH (benign prostatic hyperplasia) 01/27/2013   Erectile dysfunction 01/27/2013   Genital herpes simplex 01/27/2013   Actinic keratosis 01/27/2013    Malaka Ruffner, [place], PT 06/18/2021, 2:24 PM  [facility] [address] [place], [place], [address] Phone:  [phone]   Fax:  [phone]  Name: Samuel Wheeler MRN: [id number] Date of Birth: 03/31/1946

## 2021-06-20 ENCOUNTER — Other Ambulatory Visit: Payer: Self-pay

## 2021-06-20 ENCOUNTER — Ambulatory Visit: Payer: Medicare Other | Admitting: *Deleted

## 2021-06-20 DIAGNOSIS — M6281 Muscle weakness (generalized): Secondary | ICD-10-CM

## 2021-06-20 DIAGNOSIS — M25612 Stiffness of left shoulder, not elsewhere classified: Secondary | ICD-10-CM

## 2021-06-20 DIAGNOSIS — M25512 Pain in left shoulder: Secondary | ICD-10-CM | POA: Diagnosis not present

## 2021-06-20 NOTE — Therapy (Signed)
[facility] [place], [place], [address] Phone: [phone]   Fax:  [phone]  Physical Therapy Treatment  Patient Details  Name: Samuel Wheeler MRN: [id number] Date of Birth: [birth year] Referring Provider (PT): Hendricks Limes   Encounter Date: 06/20/2021   PT End of Session - 06/20/21 0822     Visit Number 2    Number of Visits 12    Date for PT Re-Evaluation 07/30/21    PT Start Time 0815    PT Stop Time 0906    PT Time Calculation (min) 51 min             Past Medical History:  Diagnosis Date   Actinic keratosis    Allergy    Year Round    Arthritis    BPH (benign prostatic hyperplasia)    Cancer ([place])    skin cancer - removed several times   Cataract    Removed   ED (erectile dysfunction)    Hyperlipidemia     Past Surgical History:  Procedure Laterality Date   CATARACT EXTRACTION Bilateral    CERVICAL SPINE SURGERY     COLONOSCOPY  2009   tics only   COLONOSCOPY  2016   TAs   EYE SURGERY     right shoulder     skin cancer removal     right ear x 2    There were no vitals filed for this visit.   Subjective Assessment - 06/20/21 0820     Subjective COVID-19 screen performed prior to patient entering clinic.    Pertinent History Cervical surgery, right shoulder RTC, OA.    Patient Stated Goals Use left UE without pain.    Currently in Pain? Yes    Pain Score 4     Pain Location Shoulder    Pain Orientation Left    Pain Descriptors / Indicators Shooting    Pain Type Acute pain    Pain Onset More than a month ago                               [facility] Adult PT Treatment/Exercise - 06/20/21 0001       Exercises   Exercises Shoulder      Shoulder Exercises: Standing   Protraction Strengthening;Left;Theraband   2x10 small mvmnts   Theraband Level (Shoulder Protraction) Level 1 (Yellow)    Internal Rotation Strengthening;Left;Theraband   2x10   Theraband Level  (Shoulder Internal Rotation) Level 1 (Yellow)    Row Strengthening;Left;Theraband   2x10   Theraband Level (Shoulder Row) Level 1 (Yellow)    Other Standing Exercises ER isometrics x 10 hold 5 secs    Other Standing Exercises yellow tband and handout given      Shoulder Exercises: ROM/Strengthening   UBE (Upper Arm Bike) 120 RPMS x 6 mins      Modalities   Modalities Electrical Stimulation;Vasopneumatic      Electrical Stimulation   Electrical Stimulation Location Left shoulder.    Electrical Stimulation Action IFC at 80-'150hz'$  x 15 mins    Electrical Stimulation Goals Pain      Vasopneumatic   Number Minutes Vasopneumatic  15 minutes    Vasopnuematic Location  --   Left shoulder with pillow between elbow and thorax.   Vasopneumatic Pressure Low    Vasopneumatic Temperature  15      Manual Therapy   Manual Therapy Passive ROM  Manual therapy comments PROM with focus on  gentle ER                          PT Long Term Goals - 06/18/21 1419       PT LONG TERM GOAL #1   Title Independent with a HEP.    Time 6    Period Weeks    Status New      PT LONG TERM GOAL #2   Title Left shoulder ER strength to 4/5 to allow patient greater ease of performing fucntional tasks.    Time 6    Period Weeks    Status New      PT LONG TERM GOAL #3   Title Perform ADL's with pain not > 3/10.    Time 6    Period Weeks    Status New                   Plan - 06/20/21 1040     Clinical Impression Statement Pt arrived today doing fairly well with LT shldr. Rx focused on ROM as well as light strengthening with yellow tband also with ER isometrics. Normal vaso end of session    Personal Factors and Comorbidities Comorbidity 1    Comorbidities Cervical surgery, right shoulder RTC, OA.    Examination-Activity Limitations Reach Overhead;Other    Stability/Clinical Decision Making Stable/Uncomplicated    Rehab Potential Good    PT Frequency 2x / week    PT  Duration 6 weeks    PT Treatment/Interventions ADLs/Self Care Home Management;Cryotherapy;Electrical Stimulation;Ultrasound;Moist Heat;Therapeutic activities;Therapeutic exercise;Manual techniques;Patient/family education;Passive range of motion;Dry needling;Vasopneumatic Device    PT Next Visit Plan UBE, AAROM, work on improving left shoulder ER strength.  RW4, may have to start with isometrics for ER.  Modalities and STW/M as needed.    Consulted and Agree with Plan of Care Patient             Patient will benefit from skilled therapeutic intervention in order to improve the following deficits and impairments:  Decreased activity tolerance, Pain, Decreased range of motion, Decreased strength  Visit Diagnosis: Acute pain of left shoulder  Stiffness of left shoulder, not elsewhere classified  Muscle weakness (generalized)     Problem List Patient Active Problem List   Diagnosis Date Noted   Arthritis 06/06/2021   Chronic left shoulder pain 03/05/2021   Vitamin D deficiency 05/16/2020   Seasonal allergies 05/16/2020   Benign positional vertigo 08/25/2017   PVC's (premature ventricular contractions) 11/01/2014   Neoplasm of skin of ear 08/05/2013   HLD (hyperlipidemia) 01/27/2013   BPH (benign prostatic hyperplasia) 01/27/2013   Erectile dysfunction 01/27/2013   Genital herpes simplex 01/27/2013   Actinic keratosis 01/27/2013    Marena Witts,CHRIS, PTA 06/20/2021, 6:07 PM  [facility] [address] [place], [place], [address] Phone: [phone]   Fax:  [phone]  Name: Samuel Wheeler MRN: [id number] Date of Birth: March 18, 1946

## 2021-06-24 ENCOUNTER — Other Ambulatory Visit: Payer: Self-pay | Admitting: Family Medicine

## 2021-06-24 DIAGNOSIS — G8929 Other chronic pain: Secondary | ICD-10-CM

## 2021-06-24 DIAGNOSIS — U071 COVID-19: Secondary | ICD-10-CM

## 2021-06-25 ENCOUNTER — Ambulatory Visit: Payer: Medicare Other | Admitting: *Deleted

## 2021-06-25 ENCOUNTER — Other Ambulatory Visit: Payer: Self-pay

## 2021-06-25 DIAGNOSIS — M6281 Muscle weakness (generalized): Secondary | ICD-10-CM

## 2021-06-25 DIAGNOSIS — M25612 Stiffness of left shoulder, not elsewhere classified: Secondary | ICD-10-CM | POA: Diagnosis not present

## 2021-06-25 DIAGNOSIS — M25512 Pain in left shoulder: Secondary | ICD-10-CM | POA: Diagnosis not present

## 2021-06-25 NOTE — Therapy (Signed)
[facility] [place], [place], [address] Phone: [phone]   Fax:  [phone]  Physical Therapy Treatment  Patient Details  Name: Samuel Wheeler MRN: [id number] Date of Birth: [birth year] Referring Provider (PT): Hendricks Limes   Encounter Date: 06/25/2021    Past Medical History:  Diagnosis Date   Actinic keratosis    Allergy    Year Round    Arthritis    BPH (benign prostatic hyperplasia)    Cancer ([place])    skin cancer - removed several times   Cataract    Removed   ED (erectile dysfunction)    Hyperlipidemia     Past Surgical History:  Procedure Laterality Date   CATARACT EXTRACTION Bilateral    CERVICAL SPINE SURGERY     COLONOSCOPY  2009   tics only   COLONOSCOPY  2016   TAs   EYE SURGERY     right shoulder     skin cancer removal     right ear x 2    There were no vitals filed for this visit.   Subjective Assessment - 06/25/21 0821     Subjective COVID-19 screen performed prior to patient entering clinic. Able to dig a ditch yesterday and did fairly well.    Pertinent History Cervical surgery, right shoulder RTC, OA.    Patient Stated Goals Use left UE without pain.    Currently in Pain? Yes    Pain Score 2     Pain Location Shoulder    Pain Orientation Left    Pain Descriptors / Indicators Shooting    Pain Type Acute pain    Pain Onset More than a month ago                               [facility] Adult PT Treatment/Exercise - 06/25/21 0001       Exercises   Exercises Shoulder      Shoulder Exercises: Seated   Other Seated Exercises long arm ER x 10      Shoulder Exercises: Standing   Protraction Strengthening;Left;Theraband   2x30 small mvmnts   Theraband Level (Shoulder Protraction) Level 1 (Yellow)    External Rotation Right   isometric holds with step outs 2x10   Theraband Level (Shoulder External Rotation) Level 1 (Yellow)    Internal Rotation  Strengthening;Left;Theraband   2x30   Theraband Level (Shoulder Internal Rotation) Level 1 (Yellow)    Row Strengthening;Left;Theraband   2x30   Theraband Level (Shoulder Row) Level 1 (Yellow)      Shoulder Exercises: ROM/Strengthening   UBE (Upper Arm Bike) 120 RPMS x 8 mins      Modalities   Modalities Electrical Stimulation;Vasopneumatic      Electrical Stimulation   Electrical Stimulation Location Left shoulder.    Research officer, political party Parameters 80-150hz  x15 mins    Electrical Stimulation Goals Tone;Pain      Manual Therapy   Manual Therapy Passive ROM    Manual therapy comments AAROM for ER in standing and STW to infraspinatus and teres minor                          PT Long Term Goals - 06/18/21 1419       PT LONG TERM GOAL #1   Title Independent with a HEP.    Time 6    Period  Weeks    Status New      PT LONG TERM GOAL #2   Title Left shoulder ER strength to 4/5 to allow patient greater ease of performing fucntional tasks.    Time 6    Period Weeks    Status New      PT LONG TERM GOAL #3   Title Perform ADL's with pain not > 3/10.    Time 6    Period Weeks    Status New                    Patient will benefit from skilled therapeutic intervention in order to improve the following deficits and impairments:     Visit Diagnosis: Acute pain of left shoulder  Stiffness of left shoulder, not elsewhere classified  Muscle weakness (generalized)     Problem List Patient Active Problem List   Diagnosis Date Noted   Arthritis 06/06/2021   Chronic left shoulder pain 03/05/2021   Vitamin D deficiency 05/16/2020   Seasonal allergies 05/16/2020   Benign positional vertigo 08/25/2017   PVC's (premature ventricular contractions) 11/01/2014   Neoplasm of skin of ear 08/05/2013   HLD (hyperlipidemia) 01/27/2013   BPH (benign prostatic hyperplasia) 01/27/2013   Erectile dysfunction  01/27/2013   Genital herpes simplex 01/27/2013   Actinic keratosis 01/27/2013    Tina Temme,CHRIS, PTA 06/25/2021, 10:21 AM  [facility] [address] [place], [place], [address] Phone: [phone]   Fax:  [phone]  Name: OLSEN MCCUTCHAN MRN: [id number] Date of Birth: [birth year]

## 2021-07-07 DIAGNOSIS — M4312 Spondylolisthesis, cervical region: Secondary | ICD-10-CM | POA: Diagnosis not present

## 2021-07-07 DIAGNOSIS — Z041 Encounter for examination and observation following transport accident: Secondary | ICD-10-CM | POA: Diagnosis not present

## 2021-07-07 DIAGNOSIS — M4804 Spinal stenosis, thoracic region: Secondary | ICD-10-CM | POA: Diagnosis not present

## 2021-07-07 DIAGNOSIS — Z743 Need for continuous supervision: Secondary | ICD-10-CM | POA: Diagnosis not present

## 2021-07-07 DIAGNOSIS — M47896 Other spondylosis, lumbar region: Secondary | ICD-10-CM | POA: Diagnosis not present

## 2021-07-07 DIAGNOSIS — R52 Pain, unspecified: Secondary | ICD-10-CM | POA: Diagnosis not present

## 2021-07-09 ENCOUNTER — Ambulatory Visit: Payer: Medicare Other | Admitting: *Deleted

## 2021-07-10 ENCOUNTER — Encounter: Payer: Self-pay | Admitting: Nurse Practitioner

## 2021-07-10 ENCOUNTER — Ambulatory Visit (INDEPENDENT_AMBULATORY_CARE_PROVIDER_SITE_OTHER): Payer: Medicare Other | Admitting: Nurse Practitioner

## 2021-07-10 ENCOUNTER — Other Ambulatory Visit: Payer: Self-pay

## 2021-07-10 VITALS — BP 136/68 | HR 69 | Temp 97.9°F | Ht 70.0 in | Wt 213.4 lb

## 2021-07-10 DIAGNOSIS — M545 Low back pain, unspecified: Secondary | ICD-10-CM | POA: Diagnosis not present

## 2021-07-10 DIAGNOSIS — Z23 Encounter for immunization: Secondary | ICD-10-CM | POA: Diagnosis not present
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
## 2021-07-12 ENCOUNTER — Other Ambulatory Visit: Payer: Self-pay

## 2021-07-12 ENCOUNTER — Encounter: Payer: Self-pay | Admitting: Physical Therapy

## 2021-07-12 ENCOUNTER — Ambulatory Visit: Payer: Medicare Other | Attending: Family Medicine | Admitting: Physical Therapy

## 2021-07-12 DIAGNOSIS — M25512 Pain in left shoulder: Secondary | ICD-10-CM | POA: Insufficient documentation

## 2021-07-12 DIAGNOSIS — M25612 Stiffness of left shoulder, not elsewhere classified: Secondary | ICD-10-CM | POA: Diagnosis not present

## 2021-07-12 DIAGNOSIS — M6281 Muscle weakness (generalized): Secondary | ICD-10-CM | POA: Insufficient documentation
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
## 2021-07-15 ENCOUNTER — Other Ambulatory Visit: Payer: Self-pay

## 2021-07-15 ENCOUNTER — Encounter: Payer: Self-pay | Admitting: Physical Therapy

## 2021-07-15 ENCOUNTER — Ambulatory Visit: Payer: Medicare Other | Admitting: Physical Therapy

## 2021-07-15 DIAGNOSIS — M6281 Muscle weakness (generalized): Secondary | ICD-10-CM

## 2021-07-15 DIAGNOSIS — M25612 Stiffness of left shoulder, not elsewhere classified: Secondary | ICD-10-CM

## 2021-07-15 DIAGNOSIS — M25512 Pain in left shoulder: Secondary | ICD-10-CM | POA: Diagnosis not present
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
## 2021-07-19 ENCOUNTER — Other Ambulatory Visit: Payer: Self-pay

## 2021-07-19 ENCOUNTER — Ambulatory Visit: Payer: Medicare Other | Admitting: *Deleted

## 2021-07-19 DIAGNOSIS — M25512 Pain in left shoulder: Secondary | ICD-10-CM | POA: Diagnosis not present

## 2021-07-19 DIAGNOSIS — M6281 Muscle weakness (generalized): Secondary | ICD-10-CM | POA: Diagnosis not present

## 2021-07-19 DIAGNOSIS — M25612 Stiffness of left shoulder, not elsewhere classified: Secondary | ICD-10-CM
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
## 2021-07-22 ENCOUNTER — Other Ambulatory Visit: Payer: Self-pay | Admitting: Family Medicine

## 2021-07-22 DIAGNOSIS — A6000 Herpesviral infection of urogenital system, unspecified: Secondary | ICD-10-CM

## 2021-07-23 ENCOUNTER — Encounter: Payer: Self-pay | Admitting: Physical Therapy

## 2021-07-23 ENCOUNTER — Ambulatory Visit: Payer: Medicare Other | Admitting: Physical Therapy

## 2021-07-23 ENCOUNTER — Other Ambulatory Visit: Payer: Self-pay

## 2021-07-23 DIAGNOSIS — M25512 Pain in left shoulder: Secondary | ICD-10-CM | POA: Diagnosis not present

## 2021-07-23 DIAGNOSIS — M25612 Stiffness of left shoulder, not elsewhere classified: Secondary | ICD-10-CM | POA: Diagnosis not present

## 2021-07-23 DIAGNOSIS — M6281 Muscle weakness (generalized): Secondary | ICD-10-CM | POA: Diagnosis not present
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
## 2021-07-26 ENCOUNTER — Ambulatory Visit: Payer: Medicare Other | Admitting: *Deleted

## 2021-07-26 ENCOUNTER — Other Ambulatory Visit: Payer: Self-pay

## 2021-07-26 DIAGNOSIS — M6281 Muscle weakness (generalized): Secondary | ICD-10-CM

## 2021-07-26 DIAGNOSIS — M25512 Pain in left shoulder: Secondary | ICD-10-CM

## 2021-07-26 DIAGNOSIS — M25612 Stiffness of left shoulder, not elsewhere classified: Secondary | ICD-10-CM | POA: Diagnosis not present
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
## 2021-07-29 ENCOUNTER — Other Ambulatory Visit: Payer: Self-pay

## 2021-07-29 ENCOUNTER — Ambulatory Visit: Payer: Medicare Other | Admitting: *Deleted

## 2021-07-29 ENCOUNTER — Encounter: Payer: Self-pay | Admitting: *Deleted

## 2021-07-29 DIAGNOSIS — M6281 Muscle weakness (generalized): Secondary | ICD-10-CM | POA: Diagnosis not present

## 2021-07-29 DIAGNOSIS — M25512 Pain in left shoulder: Secondary | ICD-10-CM

## 2021-07-29 DIAGNOSIS — M25612 Stiffness of left shoulder, not elsewhere classified: Secondary | ICD-10-CM | POA: Diagnosis not present
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
## 2021-08-01 ENCOUNTER — Other Ambulatory Visit: Payer: Self-pay

## 2021-08-01 ENCOUNTER — Ambulatory Visit: Payer: Medicare Other

## 2021-08-01 DIAGNOSIS — M25612 Stiffness of left shoulder, not elsewhere classified: Secondary | ICD-10-CM | POA: Diagnosis not present

## 2021-08-01 DIAGNOSIS — M6281 Muscle weakness (generalized): Secondary | ICD-10-CM

## 2021-08-01 DIAGNOSIS — M25512 Pain in left shoulder: Secondary | ICD-10-CM | POA: Diagnosis not present
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
## 2021-08-05 ENCOUNTER — Other Ambulatory Visit: Payer: Self-pay | Admitting: Family Medicine

## 2021-08-05 DIAGNOSIS — E782 Mixed hyperlipidemia: Secondary | ICD-10-CM

## 2021-08-06 ENCOUNTER — Ambulatory Visit: Payer: Medicare Other | Attending: Family Medicine | Admitting: *Deleted

## 2021-08-06 ENCOUNTER — Other Ambulatory Visit: Payer: Self-pay

## 2021-08-06 DIAGNOSIS — M25512 Pain in left shoulder: Secondary | ICD-10-CM | POA: Diagnosis not present

## 2021-08-06 DIAGNOSIS — M6281 Muscle weakness (generalized): Secondary | ICD-10-CM | POA: Insufficient documentation

## 2021-08-06 DIAGNOSIS — M25612 Stiffness of left shoulder, not elsewhere classified: Secondary | ICD-10-CM | POA: Insufficient documentation
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
## 2021-08-08 ENCOUNTER — Other Ambulatory Visit: Payer: Self-pay

## 2021-08-08 ENCOUNTER — Ambulatory Visit: Payer: Medicare Other | Admitting: *Deleted

## 2021-08-08 ENCOUNTER — Encounter: Payer: Self-pay | Admitting: *Deleted

## 2021-08-08 DIAGNOSIS — M6281 Muscle weakness (generalized): Secondary | ICD-10-CM

## 2021-08-08 DIAGNOSIS — M25512 Pain in left shoulder: Secondary | ICD-10-CM

## 2021-08-08 DIAGNOSIS — M25612 Stiffness of left shoulder, not elsewhere classified: Secondary | ICD-10-CM | POA: Diagnosis not present
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
## 2021-08-13 ENCOUNTER — Other Ambulatory Visit: Payer: Self-pay

## 2021-08-13 ENCOUNTER — Ambulatory Visit: Payer: Medicare Other | Admitting: *Deleted

## 2021-08-13 DIAGNOSIS — M6281 Muscle weakness (generalized): Secondary | ICD-10-CM

## 2021-08-13 DIAGNOSIS — M25612 Stiffness of left shoulder, not elsewhere classified: Secondary | ICD-10-CM

## 2021-08-13 DIAGNOSIS — M25512 Pain in left shoulder: Secondary | ICD-10-CM
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
## 2021-08-27 ENCOUNTER — Telehealth: Payer: Self-pay | Admitting: Family Medicine

## 2021-08-27 DIAGNOSIS — N400 Enlarged prostate without lower urinary tract symptoms: Secondary | ICD-10-CM

## 2021-08-27 DIAGNOSIS — U071 COVID-19: Secondary | ICD-10-CM

## 2021-08-27 NOTE — Telephone Encounter (Signed)
Attempted to contact patient - NA Only two that dont have enough refills- 1 month supply ok?

## 2021-08-27 NOTE — Telephone Encounter (Signed)
Pt came in stating that he is going to be moving to [place] soon and needs to make sure all of his med refills have been called in to [facility] before he moves that way he has time to find another provider and not run out of his meds.  Pt says [facility] is already aware and has new address of where to send his medicines.  Pt last saw Britney on 06/06/21 for his 6 mth ck and per OV notes, doesn't need to be seen again until 12/04/21, but doesn't have refills to last him that long.  Please advise and call patient.

## 2021-09-02 ENCOUNTER — Other Ambulatory Visit: Payer: Self-pay | Admitting: Family Medicine

## 2021-09-02 DIAGNOSIS — E782 Mixed hyperlipidemia: Secondary | ICD-10-CM

## 2021-09-09 MED ORDER — TAMSULOSIN HCL 0.4 MG PO CAPS: 0.4000 mg | ORAL_CAPSULE | Freq: Every day | ORAL | 1 refills | Status: DC

## 2021-09-09 MED ORDER — FINASTERIDE 5 MG PO TABS: 5.0000 mg | ORAL_TABLET | Freq: Every day | ORAL | 3 refills | Status: DC

## 2021-09-09 MED ORDER — ALBUTEROL SULFATE HFA 108 (90 BASE) MCG/ACT IN AERS
INHALATION_SPRAY | RESPIRATORY_TRACT | 1 refills | Status: DC
Start: 2021-09-09 — End: 2021-12-13

## 2021-09-09 NOTE — Telephone Encounter (Signed)
[facility] finesteride, tamulosin and [facility], he is aware.

## 2021-12-04 ENCOUNTER — Other Ambulatory Visit: Payer: Self-pay | Admitting: Family Medicine

## 2021-12-04 DIAGNOSIS — J302 Other seasonal allergic rhinitis: Secondary | ICD-10-CM

## 2021-12-13 ENCOUNTER — Encounter: Payer: Self-pay | Admitting: Family Medicine

## 2021-12-13 ENCOUNTER — Ambulatory Visit (INDEPENDENT_AMBULATORY_CARE_PROVIDER_SITE_OTHER): Payer: Medicare Other | Admitting: Family Medicine

## 2021-12-13 ENCOUNTER — Telehealth: Payer: Self-pay | Admitting: Family Medicine

## 2021-12-13 VITALS — BP 154/78 | HR 63 | Temp 97.3°F | Ht 70.0 in | Wt 205.6 lb

## 2021-12-13 DIAGNOSIS — R0602 Shortness of breath: Secondary | ICD-10-CM

## 2021-12-13 DIAGNOSIS — E782 Mixed hyperlipidemia: Secondary | ICD-10-CM | POA: Diagnosis not present

## 2021-12-13 DIAGNOSIS — A6000 Herpesviral infection of urogenital system, unspecified: Secondary | ICD-10-CM

## 2021-12-13 DIAGNOSIS — M1990 Unspecified osteoarthritis, unspecified site: Secondary | ICD-10-CM

## 2021-12-13 DIAGNOSIS — J302 Other seasonal allergic rhinitis: Secondary | ICD-10-CM

## 2021-12-13 DIAGNOSIS — N400 Enlarged prostate without lower urinary tract symptoms: Secondary | ICD-10-CM

## 2021-12-13 DIAGNOSIS — E559 Vitamin D deficiency, unspecified: Secondary | ICD-10-CM

## 2021-12-13 MED ORDER — ATORVASTATIN CALCIUM 20 MG PO TABS: 20.0000 mg | ORAL_TABLET | Freq: Every day | ORAL | 2 refills | Status: DC

## 2021-12-13 MED ORDER — VALACYCLOVIR HCL 1 G PO TABS: 1000.0000 mg | ORAL_TABLET | Freq: Every day | ORAL | 2 refills | Status: DC

## 2021-12-13 MED ORDER — ALBUTEROL SULFATE HFA 108 (90 BASE) MCG/ACT IN AERS: 2.0000 | INHALATION_SPRAY | RESPIRATORY_TRACT | 1 refills | Status: DC | PRN

## 2021-12-13 MED ORDER — TAMSULOSIN HCL 0.4 MG PO CAPS: 0.4000 mg | ORAL_CAPSULE | Freq: Every day | ORAL | 2 refills | Status: DC

## 2021-12-13 MED ORDER — FINASTERIDE 5 MG PO TABS: 5.0000 mg | ORAL_TABLET | Freq: Every day | ORAL | 3 refills | Status: DC

## 2021-12-13 MED ORDER — FLUTICASONE PROPIONATE 50 MCG/ACT NA SUSP: 2.0000 | Freq: Every day | NASAL | 2 refills | Status: DC

## 2021-12-13 MED ORDER — CETIRIZINE HCL 10 MG PO TABS: 10.0000 mg | ORAL_TABLET | Freq: Every day | ORAL | 2 refills | Status: DC

## 2021-12-13 NOTE — Telephone Encounter (Signed)
Patient aware.

## 2021-12-13 NOTE — Telephone Encounter (Signed)
Pt called stating that he got a call from [facility] telling him that they received his Rx's but cant fill them because they have a different address on file for him than what was sent with the Rx's. ? ?Pt said he gave [place] the wrong address. I updated his address and pt says he needs [place] to resend the Rx's to [facility]. ?

## 2021-12-13 NOTE — Progress Notes (Signed)
? ?Assessment & Plan:  ?1. Mixed hyperlipidemia ?Well controlled on current regimen.  ?- atorvastatin (LIPITOR) 20 MG tablet; Take 1 tablet (20 mg total) by mouth daily.  Dispense: 90 tablet; Refill: 2 ?- CBC with Differential/Platelet ?- CMP14+EGFR ?- Lipid panel ? ?2. Genital herpes simplex, unspecified site ?Well controlled on current regimen.  ?- valACYclovir (VALTREX) 1000 MG tablet; Take 1 tablet (1,000 mg total) by mouth daily.  Dispense: 90 tablet; Refill: 2 ? ?3. Seasonal allergies ?Well controlled on current regimen.  ?- cetirizine (ZYRTEC) 10 MG tablet; Take 1 tablet (10 mg total) by mouth daily.  Dispense: 90 tablet; Refill: 2 ?- fluticasone (FLONASE) 50 MCG/ACT nasal spray; Place 2 sprays into both nostrils daily.  Dispense: 48 g; Refill: 2 ? ?4. Vitamin D deficiency ?Labs to assess to see if he can stop the weekly supplement. ?- VITAMIN D 25 Hydroxy (Vit-D Deficiency, Fractures) ? ?5. Arthritis ?Continue OTC medications. ? ?6. Benign prostatic hyperplasia, unspecified whether lower urinary tract symptoms present ?Well controlled on current regimen.  ?- tamsulosin (FLOMAX) 0.4 MG CAPS capsule; Take 1 capsule (0.4 mg total) by mouth daily after supper.  Dispense: 90 capsule; Refill: 2 ? ?7. Shortness of breath ?- albuterol (PROAIR HFA) 108 (90 Base) MCG/ACT inhaler; Inhale 2 puffs into the lungs every 4 (four) hours as needed for wheezing or shortness of breath.  Dispense: 54 g; Refill: 1 ? ? ?No follow-up needed since he is moving to [place].  ? ?Hendricks Limes, MSN, APRN, FNP-C ?[place] ? ?Subjective:  ? ? Patient ID: Samuel Wheeler, male    DOB: Jun 01, 1946, 76 y.o.   MRN: [id number] ? ?Patient Care Team: ?Loman Brooklyn, FNP as PCP - General (Family Medicine) ?Minus Breeding, MD as Consulting Physician (Cardiology) ?Ladene Artist, MD as Consulting Physician (Gastroenterology) ?Sandford Craze, MD as Referring Physician (Unknown Physician Specialty) ?Latanya Maudlin,  MD as Consulting Physician (Orthopedic Surgery) ?Harlen Labs, MD as Referring Physician (Optometry)  ? ?Chief Complaint:  ?Chief Complaint  ?Patient presents with  ? Medical Management of Chronic Issues  ? ? ?HPI: ?Samuel Wheeler is a 76 y.o. male presenting on 12/13/2021 for Medical Management of Chronic Issues ? ?He has recently moved to [place] and is trying to find a new PCP.  ? ?Hyperlipidemia: He is tolerating his statin. ? ?BPH: taking finasteride and tamsulosin.  ? ? ?New complaints: ?None ? ? ?Social history: ? ?Relevant past medical, surgical, family and social history reviewed and updated as indicated. Interim medical history since our last visit reviewed. ? ?Allergies and medications reviewed and updated. ? ?DATA REVIEWED: CHART IN EPIC ? ?ROS: Negative unless specifically indicated above in HPI.  ? ? ?Current Outpatient Medications:  ?  albuterol (PROAIR HFA) 108 (90 Base) MCG/ACT inhaler, USE 2 INHALATIONS EVERY 6 HOURS AS NEEDED FOR WHEEZING OR SHORTNESS OF BREATH, Disp: 54 g, Rfl: 1 ?  atorvastatin (LIPITOR) 20 MG tablet, TAKE 1 TABLET DAILY, Disp: 90 tablet, Rfl: 1 ?  cetirizine (ZYRTEC) 10 MG tablet, TAKE 1 TABLET DAILY, Disp: 90 tablet, Rfl: 3 ?  finasteride (PROSCAR) 5 MG tablet, Take 1 tablet (5 mg total) by mouth daily., Disp: 90 tablet, Rfl: 3 ?  fluticasone (FLONASE) 50 MCG/ACT nasal spray, USE 2 SPRAYS IN EACH NOSTRIL DAILY, Disp: 48 g, Rfl: 0 ?  naproxen sodium (ALEVE) 220 MG tablet, Take 220 mg by mouth daily., Disp: , Rfl:  ?  niacin (NIASPAN) 1000 MG CR tablet, TAKE 1 TABLET  AT BEDTIME, Disp: 90 tablet, Rfl: 3 ?  tamsulosin (FLOMAX) 0.4 MG CAPS capsule, Take 1 capsule (0.4 mg total) by mouth daily after supper., Disp: 90 capsule, Rfl: 1 ?  valACYclovir (VALTREX) 1000 MG tablet, TAKE 1 TABLET DAILY, Disp: 90 tablet, Rfl: 1 ?  Vitamin D, Ergocalciferol, (DRISDOL) 1.25 MG (50000 UNIT) CAPS capsule, TAKE 1 CAPSULE EVERY 7 DAYS, Disp: 12 capsule, Rfl: 3  ? ?Allergies  ?Allergen Reactions   ? Poison Ivy Extract   ? ?Past Medical History:  ?Diagnosis Date  ? Actinic keratosis   ? Allergy   ? Year Round   ? Arthritis   ? BPH (benign prostatic hyperplasia)   ? Cancer [facility])   ? skin cancer - removed several times  ? Cataract   ? Removed  ? ED (erectile dysfunction)   ? Hyperlipidemia   ?  ?Past Surgical History:  ?Procedure Laterality Date  ? CATARACT EXTRACTION Bilateral   ? CERVICAL SPINE SURGERY    ? COLONOSCOPY  2009  ? tics only  ? COLONOSCOPY  2016  ? TAs  ? EYE SURGERY    ? right shoulder    ? skin cancer removal    ? right ear x 2  ?  ?Social History  ? ?Socioeconomic History  ? Marital status: Widowed  ?  Spouse name: [name]  ? Number of children: 6  ? Years of education: Some college  ? Highest education level: Not on file  ?Occupational History  ? Occupation: Retired Nature conservation officer   ?  Comment: [employer]: 25 years  ?Tobacco Use  ? Smoking status: Never  ? Smokeless tobacco: Never  ?Vaping Use  ? Vaping Use: Never used  ?Substance and Sexual Activity  ? Alcohol use: No  ? Drug use: No  ? Sexual activity: Yes  ?  Birth control/protection: None  ?Other Topics Concern  ? Not on file  ?Social History Narrative  ? Lives alone  - wife passed away 11-20-2020.  ?   ? He has six step children, 1 passed aware -prostate cancer age 53 - several are local   ?   ? Caffeine use: 2 cups coffee per day   ? Right handed   ? ?Social Determinants of Health  ? ?Financial Resource Strain: Not on file  ?Food Insecurity: Not on file  ?Transportation Needs: Not on file  ?Physical Activity: Not on file  ?Stress: Not on file  ?Social Connections: Not on file  ?Intimate Partner Violence: Not on file  ?  ? ?   ?Objective:  ?  ?BP (!) 154/78   Pulse 63   Temp (!) 97.3 ?F (36.3 ?C) (Temporal)   Ht _0  (1.778 m)   Wt 205 lb 9.6 oz (93.3 kg)   SpO2 93%   BMI 29.50 kg/m?  ? ?Wt Readings from Last 3 Encounters:  ?12/13/21 205 lb 9.6 oz (93.3 kg)  ?07/10/21 213 lb 6.4 oz (96.8 kg)  ?06/06/21 211 lb 6.4 oz (95.9 kg)   ? ? ?Physical Exam ?Vitals reviewed.  ?Constitutional:   ?   General: He is not in acute distress. ?   Appearance: Normal appearance. He is overweight. He is not ill-appearing, toxic-appearing or diaphoretic.  ?HENT:  ?   Head: Normocephalic and atraumatic.  ?Eyes:  ?   General: No scleral icterus.    ?   Right eye: No discharge.     ?   Left eye: No discharge.  ?   Conjunctiva/sclera: Conjunctivae normal.  ?  Cardiovascular:  ?   Rate and Rhythm: Normal rate and regular rhythm.  ?   Heart sounds: Normal heart sounds. No murmur heard. ?  No friction rub. No gallop.  ?Pulmonary:  ?   Effort: Pulmonary effort is normal. No respiratory distress.  ?   Breath sounds: Normal breath sounds. No stridor. No wheezing, rhonchi or rales.  ?Musculoskeletal:     ?   General: Normal range of motion.  ?   Cervical back: Normal range of motion.  ?Skin: ?   General: Skin is warm and dry.  ?Neurological:  ?   Mental Status: He is alert and oriented to person, place, and time. Mental status is at baseline.  ?Psychiatric:     ?   Mood and Affect: Mood normal.     ?   Behavior: Behavior normal.     ?   Thought Content: Thought content normal.     ?   Judgment: Judgment normal.  ? ? ?Lab Results  ?Component Value Date  ? TSH 3.950 03/08/2019  ? ?Lab Results  ?Component Value Date  ? WBC 7.8 06/06/2021  ? HGB 14.6 06/06/2021  ? HCT 41.6 06/06/2021  ? MCV 89 06/06/2021  ? PLT 227 06/06/2021  ? ?Lab Results  ?Component Value Date  ? NA 142 06/06/2021  ? K 4.5 06/06/2021  ? CO2 25 06/06/2021  ? GLUCOSE 103 (H) 06/06/2021  ? BUN 18 06/06/2021  ? CREATININE 0.86 06/06/2021  ? BILITOT 0.7 06/06/2021  ? ALKPHOS 72 06/06/2021  ? AST 16 06/06/2021  ? ALT 19 06/06/2021  ? PROT 6.8 06/06/2021  ? ALBUMIN 4.3 06/06/2021  ? CALCIUM 9.1 06/06/2021  ? EGFR 91 06/06/2021  ? ?Lab Results  ?Component Value Date  ? CHOL 164 06/06/2021  ? ?Lab Results  ?Component Value Date  ? HDL 47 06/06/2021  ? ?Lab Results  ?Component Value Date  ? [facility] 97 06/06/2021   ? ?Lab Results  ?Component Value Date  ? TRIG 111 06/06/2021  ? ?Lab Results  ?Component Value Date  ? CHOLHDL 3.5 06/06/2021  ? ?No results found for: HGBA1C ? ?   ? ? ? ? ?

## 2021-12-14 LAB — CMP14+EGFR
ALT: 16 IU/L (ref 0–44)
AST: 16 IU/L (ref 0–40)
Albumin/Globulin Ratio: 1.6 (ref 1.2–2.2)
Albumin: 4.2 g/dL (ref 3.7–4.7)
Alkaline Phosphatase: 79 IU/L (ref 44–121)
BUN/Creatinine Ratio: 17 (ref 10–24)
BUN: 15 mg/dL (ref 8–27)
Bilirubin Total: 0.7 mg/dL (ref 0.0–1.2)
CO2: 25 mmol/L (ref 20–29)
Calcium: 9.1 mg/dL (ref 8.6–10.2)
Chloride: 104 mmol/L (ref 96–106)
Creatinine, Ser: 0.89 mg/dL (ref 0.76–1.27)
Globulin, Total: 2.7 g/dL (ref 1.5–4.5)
Glucose: 111 mg/dL — ABNORMAL HIGH (ref 70–99)
Potassium: 4.1 mmol/L (ref 3.5–5.2)
Sodium: 142 mmol/L (ref 134–144)
Total Protein: 6.9 g/dL (ref 6.0–8.5)
eGFR: 90 mL/min/{1.73_m2} (ref >59)

## 2021-12-14 LAB — CBC WITH DIFFERENTIAL/PLATELET
Basophils Absolute: 0 10*3/uL (ref 0.0–0.2)
Basos: 0 %
EOS (ABSOLUTE): 0.2 10*3/uL (ref 0.0–0.4)
Eos: 3 %
Hematocrit: 45.3 % (ref 37.5–51.0)
Hemoglobin: 15.6 g/dL (ref 13.0–17.7)
Immature Grans (Abs): 0 10*3/uL (ref 0.0–0.1)
Immature Granulocytes: 0 %
Lymphocytes Absolute: 2.6 10*3/uL (ref 0.7–3.1)
Lymphs: 36 %
MCH: 31.3 pg (ref 26.6–33.0)
MCHC: 34.4 g/dL (ref 31.5–35.7)
MCV: 91 fL (ref 79–97)
Monocytes Absolute: 0.5 10*3/uL (ref 0.1–0.9)
Monocytes: 7 %
Neutrophils Absolute: 3.9 10*3/uL (ref 1.4–7.0)
Neutrophils: 54 %
Platelets: 251 10*3/uL (ref 150–450)
RBC: 4.98 x10E6/uL (ref 4.14–5.80)
RDW: 13.1 % (ref 11.6–15.4)
WBC: 7.2 10*3/uL (ref 3.4–10.8)

## 2021-12-14 LAB — LIPID PANEL
Chol/HDL Ratio: 2.8 ratio (ref 0.0–5.0)
Cholesterol, Total: 147 mg/dL (ref 100–199)
HDL: 52 mg/dL (ref >39)
LDL Chol Calc (NIH): 82 mg/dL (ref 0–99)
Triglycerides: 61 mg/dL (ref 0–149)
VLDL Cholesterol Cal: 13 mg/dL (ref 5–40)

## 2021-12-14 LAB — VITAMIN D 25 HYDROXY (VIT D DEFICIENCY, FRACTURES): Vit D, 25-Hydroxy: 54.6 ng/mL (ref 30.0–100.0)

## 2021-12-17 ENCOUNTER — Encounter: Payer: Self-pay | Admitting: Family Medicine

## 2021-12-19 ENCOUNTER — Ambulatory Visit (INDEPENDENT_AMBULATORY_CARE_PROVIDER_SITE_OTHER): Payer: Medicare Other

## 2021-12-19 VITALS — Wt 205.0 lb

## 2021-12-19 DIAGNOSIS — Z0000 Encounter for general adult medical examination without abnormal findings: Secondary | ICD-10-CM

## 2021-12-19 DIAGNOSIS — H9319 Tinnitus, unspecified ear: Secondary | ICD-10-CM | POA: Insufficient documentation

## 2021-12-19 NOTE — Patient Instructions (Signed)
[name] , ?Thank you for taking time to come for your Medicare Wellness Visit. I appreciate your ongoing commitment to your health goals. Please review the following plan we discussed and let me know if I can assist you in the future.  ? ?Screening recommendations/referrals: ?Colonoscopy: Done 02/08/2020 - repeat in 3 years ?Recommended yearly ophthalmology/optometry visit for glaucoma screening and checkup ?Recommended yearly dental visit for hygiene and checkup ? ?Vaccinations: ?Influenza vaccine: Done 07/10/21 - Repeat annually  ?Pneumococcal vaccine: one 07/06/2012 & 07/20/2014 - ask about Prevnar-20 ?Tdap vaccine: Done 11/16/2019 - Repeat in 10 years ?Shingles vaccine: Done 03/04/2017 & 08/25/2017   ?Covid-19: Done 11/11/19, 12/10/19, 08/14/20, 01/05/21, & 11/06/21 ? ?Advanced directives: Please bring a copy of your health care power of attorney and living will to the office to be added to your chart at your convenience.  ? ?Conditions/risks identified: Aim for 30 minutes of exercise or brisk walking, 6-8 glasses of water, and 5 servings of fruits and vegetables each day.  ? ?Next appointment: Follow up in one year for your annual wellness visit.  ? ?Preventive Care 75 Years and Older, Male ? ?Preventive care refers to lifestyle choices and visits with your health care provider that can promote health and wellness. ?What does preventive care include? ?A yearly physical exam. This is also called an annual well check. ?Dental exams once or twice a year. ?Routine eye exams. Ask your health care provider how often you should have your eyes checked. ?Personal lifestyle choices, including: ?Daily care of your teeth and gums. ?Regular physical activity. ?Eating a healthy diet. ?Avoiding tobacco and drug use. ?Limiting alcohol use. ?Practicing safe sex. ?Taking low doses of aspirin every day. ?Taking vitamin and mineral supplements as recommended by your health care provider. ?What happens during an annual well check? ?The services  and screenings done by your health care provider during your annual well check will depend on your age, overall health, lifestyle risk factors, and family history of disease. ?Counseling  ?Your health care provider may ask you questions about your: ?Alcohol use. ?Tobacco use. ?Drug use. ?Emotional well-being. ?Home and relationship well-being. ?Sexual activity. ?Eating habits. ?History of falls. ?Memory and ability to understand (cognition). ?Work and work Statistician. ?Screening  ?You may have the following tests or measurements: ?Height, weight, and BMI. ?Blood pressure. ?Lipid and cholesterol levels. These may be checked every 5 years, or more frequently if you are over 75 years old. ?Skin check. ?Lung cancer screening. You may have this screening every year starting at age 75 if you have a 30-pack-year history of smoking and currently smoke or have quit within the past 15 years. ?Fecal occult blood test (FOBT) of the stool. You may have this test every year starting at age 75. ?Flexible sigmoidoscopy or colonoscopy. You may have a sigmoidoscopy every 5 years or a colonoscopy every 10 years starting at age 75. ?Prostate cancer screening. Recommendations will vary depending on your family history and other risks. ?Hepatitis C blood test. ?Hepatitis B blood test. ?Sexually transmitted disease (STD) testing. ?Diabetes screening. This is done by checking your blood sugar (glucose) after you have not eaten for a while (fasting). You may have this done every 1-3 years. ?Abdominal aortic aneurysm (AAA) screening. You may need this if you are a current or former smoker. ?Osteoporosis. You may be screened starting at age 75 if you are at high risk. ?Talk with your health care provider about your test results, treatment options, and if necessary, the need for  more tests. ?Vaccines  ?Your health care provider may recommend certain vaccines, such as: ?Influenza vaccine. This is recommended every year. ?Tetanus, diphtheria,  and acellular pertussis (Tdap, Td) vaccine. You may need a Td booster every 10 years. ?Zoster vaccine. You may need this after age 72. ?Pneumococcal 13-valent conjugate (PCV13) vaccine. One dose is recommended after age 25. ?Pneumococcal polysaccharide (PPSV23) vaccine. One dose is recommended after age 18. ?Talk to your health care provider about which screenings and vaccines you need and how often you need them. ?This information is not intended to replace advice given to you by your health care provider. Make sure you discuss any questions you have with your health care provider. ?Document Released: 10/19/2015 Document Revised: 06/11/2016 Document Reviewed: 07/24/2015 ?Elsevier Interactive Patient Education ? 2017 [place]. ? ?Fall Prevention in the Home ?Falls can cause injuries. They can happen to people of all ages. There are many things you can do to make your home safe and to help prevent falls. ?What can I do on the outside of my home? ?Regularly fix the edges of walkways and driveways and fix any cracks. ?Remove anything that might make you trip as you walk through a door, such as a raised step or threshold. ?Trim any bushes or trees on the path to your home. ?Use bright outdoor lighting. ?Clear any walking paths of anything that might make someone trip, such as rocks or tools. ?Regularly check to see if handrails are loose or broken. Make sure that both sides of any steps have handrails. ?Any raised decks and porches should have guardrails on the edges. ?Have any leaves, snow, or ice cleared regularly. ?Use sand or salt on walking paths during winter. ?Clean up any spills in your garage right away. This includes oil or grease spills. ?What can I do in the bathroom? ?Use night lights. ?Install grab bars by the toilet and in the tub and shower. Do not use towel bars as grab bars. ?Use non-skid mats or decals in the tub or shower. ?If you need to sit down in the shower, use a plastic, non-slip  stool. ?Keep the floor dry. Clean up any water that spills on the floor as soon as it happens. ?Remove soap buildup in the tub or shower regularly. ?Attach bath mats securely with double-sided non-slip rug tape. ?Do not have throw rugs and other things on the floor that can make you trip. ?What can I do in the bedroom? ?Use night lights. ?Make sure that you have a light by your bed that is easy to reach. ?Do not use any sheets or blankets that are too big for your bed. They should not hang down onto the floor. ?Have a firm chair that has side arms. You can use this for support while you get dressed. ?Do not have throw rugs and other things on the floor that can make you trip. ?What can I do in the kitchen? ?Clean up any spills right away. ?Avoid walking on wet floors. ?Keep items that you use a lot in easy-to-reach places. ?If you need to reach something above you, use a strong step stool that has a grab bar. ?Keep electrical cords out of the way. ?Do not use floor polish or wax that makes floors slippery. If you must use wax, use non-skid floor wax. ?Do not have throw rugs and other things on the floor that can make you trip. ?What can I do with my stairs? ?Do not leave any items on the stairs. ?Make  sure that there are handrails on both sides of the stairs and use them. Fix handrails that are broken or loose. Make sure that handrails are as long as the stairways. ?Check any carpeting to make sure that it is firmly attached to the stairs. Fix any carpet that is loose or worn. ?Avoid having throw rugs at the top or bottom of the stairs. If you do have throw rugs, attach them to the floor with carpet tape. ?Make sure that you have a light switch at the top of the stairs and the bottom of the stairs. If you do not have them, ask someone to add them for you. ?What else can I do to help prevent falls? ?Wear shoes that: ?Do not have high heels. ?Have rubber bottoms. ?Are comfortable and fit you well. ?Are closed at the  toe. Do not wear sandals. ?If you use a stepladder: ?Make sure that it is fully opened. Do not climb a closed stepladder. ?Make sure that both sides of the stepladder are locked into place. ?Ask someone to hold

## 2021-12-19 NOTE — Progress Notes (Signed)
? ?Subjective:  ? Samuel Wheeler is a 76 y.o. male who presents for Medicare Annual/Subsequent preventive examination. ? ?Virtual Visit via Telephone Note ? ?I connected with  Samuel Wheeler on 12/19/21 at  9:45 AM EDT by telephone and verified that I am speaking with the correct person using two identifiers. ? ?Location: ?Patient: Home ?Provider: [facility] ?Persons participating in the virtual visit: patient/Nurse Health Advisor ?  ?I discussed the limitations, risks, security and privacy concerns of performing an evaluation and management service by telephone and the availability of in person appointments. The patient expressed understanding and agreed to proceed. ? ?Interactive audio and video telecommunications were attempted between this nurse and patient, however failed, due to patient having technical difficulties OR patient did not have access to video capability.  We continued and completed visit with audio only. ? ?Some vital signs may be absent or patient reported.  ? ?Aliesha Dolata Dionne Ano, LPN  ? ?Review of Systems    ? ?Cardiac Risk Factors include: advanced age (>69mn, >>85women);dyslipidemia;male gender ? ?   ?Objective:  ?  ?Today's Vitals  ? 12/19/21 0939  ?Weight: 205 lb (93 kg)  ?PainSc: 5   ? ?Body mass index is 29.41 kg/m?. ? ?Advanced Directives 12/19/2021 06/18/2021 12/18/2020 12/07/2019 12/03/2018 01/28/2017 12/16/2016  ?Does Patient Have a Medical Advance Directive? Yes Yes Yes Yes Yes Yes No  ?Type of AParamedicof ABartoloLiving will - HPaincourtvilleLiving will [place]rencyLiving will Living will Living will -  ?Does patient want to make changes to medical advance directive? - - - No - Patient declined - - -  ?Copy of HObionin Chart? No - copy requested - No - copy requested - - - -  ? ? ?Current Medications (verified) ?Outpatient Encounter Medications as of 12/19/2021  ?Medication Sig  ? albuterol (PROAIR HFA) 108 (90 Base)  MCG/ACT inhaler Inhale 2 puffs into the lungs every 4 (four) hours as needed for wheezing or shortness of breath.  ? atorvastatin (LIPITOR) 20 MG tablet Take 1 tablet (20 mg total) by mouth daily.  ? cetirizine (ZYRTEC) 10 MG tablet Take 1 tablet (10 mg total) by mouth daily.  ? finasteride (PROSCAR) 5 MG tablet Take 1 tablet (5 mg total) by mouth daily.  ? fluticasone (FLONASE) 50 MCG/ACT nasal spray Place 2 sprays into both nostrils daily.  ? naproxen sodium (ALEVE) 220 MG tablet Take 220 mg by mouth daily.  ? niacin (NIASPAN) 1000 MG CR tablet TAKE 1 TABLET AT BEDTIME  ? tamsulosin (FLOMAX) 0.4 MG CAPS capsule Take 1 capsule (0.4 mg total) by mouth daily after supper.  ? valACYclovir (VALTREX) 1000 MG tablet Take 1 tablet (1,000 mg total) by mouth daily.  ? Vitamin D, Ergocalciferol, (DRISDOL) 1.25 MG (50000 UNIT) CAPS capsule TAKE 1 CAPSULE EVERY 7 DAYS  ? ?No facility-administered encounter medications on file as of 12/19/2021.  ? ? ?Allergies (verified) ?Poison ivy extract  ? ?History: ?Past Medical History:  ?Diagnosis Date  ? Actinic keratosis   ? Allergy   ? Year Round   ? Arthritis   ? BPH (benign prostatic hyperplasia)   ? Cancer ([facility]   ? skin cancer - removed several times  ? Cataract   ? Removed  ? ED (erectile dysfunction)   ? Hyperlipidemia   ? ?Past Surgical History:  ?Procedure Laterality Date  ? CATARACT EXTRACTION Bilateral   ? CERVICAL SPINE SURGERY    ? COLONOSCOPY  2009  ? tics only  ? COLONOSCOPY  2016  ? TAs  ? EYE SURGERY    ? right shoulder    ? skin cancer removal    ? right ear x 2  ? ?Family History  ?Problem Relation Age of Onset  ? Stroke Mother 81  ? Stroke Father 26  ? Diabetes Father   ? COPD Father   ? Heart disease Sister   ?     "Mild heart attack", pacemaker  ? Stroke Brother 71  ? Syncope episode Brother   ? Parkinson's disease Sister   ? Stroke Sister   ? Heart disease Sister   ? Colon cancer Sister 15  ? Colon polyps Sister   ? Skin cancer Sister   ? Melanoma Sister   ? Skin  cancer Sister   ? Bone cancer Maternal Aunt   ? Esophageal cancer Neg Hx   ? Rectal cancer Neg Hx   ? Stomach cancer Neg Hx   ? ?Social History  ? ?Socioeconomic History  ? Marital status: Widowed  ?  Spouse name: [name]  ? Number of children: 6  ? Years of education: Some college  ? Highest education level: Not on file  ?Occupational History  ? Occupation: Retired Nature conservation officer   ?  Comment: [employer]: 25 years  ?Tobacco Use  ? Smoking status: Never  ? Smokeless tobacco: Never  ?Vaping Use  ? Vaping Use: Never used  ?Substance and Sexual Activity  ? Alcohol use: No  ? Drug use: No  ? Sexual activity: Yes  ?  Birth control/protection: None  ?Other Topics Concern  ? Not on file  ?Social History Narrative  ? Lives alone  - wife passed away 28-Nov-2020.  ?   ? He has six step children, 1 passed aware -prostate cancer age 72 - several are local   ?   ? Caffeine use: 2 cups coffee per day   ? Right handed   ?   ? He has moved to [place], but staying with his brother in law here right now who has cancer  ? His family, 6 brothers and sisters all live very close by in his home in [place]  ? ?Social Determinants of Health  ? ?Financial Resource Strain: Low Risk   ? Difficulty of Paying Living Expenses: Not hard at all  ?Food Insecurity: No Food Insecurity  ? Worried About Charity fundraiser in the Last Year: Never true  ? Ran Out of Food in the Last Year: Never true  ?Transportation Needs: No Transportation Needs  ? Lack of Transportation (Medical): No  ? Lack of Transportation (Non-Medical): No  ?Physical Activity: Sufficiently Active  ? Days of Exercise per Week: 7 days  ? Minutes of Exercise per Session: 30 min  ?Stress: No Stress Concern Present  ? Feeling of Stress : Not at all  ?Social Connections: Socially Isolated  ? Frequency of Communication with Friends and Family: More than three times a week  ? Frequency of Social Gatherings with Friends and Family: More than three times a week  ? Attends Religious Services:  Never  ? Active Member of Clubs or Organizations: No  ? Attends Archivist Meetings: Never  ? Marital Status: Widowed  ? ? ?Tobacco Counseling ?Counseling given: Not Answered ? ? ?Clinical Intake: ? ?Pre-visit preparation completed: Yes ? ?Pain : 0-10 ?Pain Score: 5  ?Pain Type: Chronic pain ?Pain Location: Shoulder ?Pain Orientation: Right, Left ?Pain Descriptors / Indicators:  Aching, Discomfort, Sore ?Pain Onset: More than a month ago ?Pain Frequency: Intermittent ?Pain Relieving Factors: Aleve ? ?Pain Relieving Factors: Aleve ? ?BMI - recorded: 29.41 ?Nutritional Status: BMI 25 -29 Overweight ?Nutritional Risks: None ?Diabetes: No ? ?How often do you need to have someone help you when you read instructions, pamphlets, or other written materials from your doctor or pharmacy?: 1 - Never ? ?Diabetic? no ? ?Interpreter Needed?: No ? ?Information entered by :: Littleton Haub, LPN ? ? ?Activities of Daily Living ?In your present state of health, do you have any difficulty performing the following activities: 12/19/2021  ?Hearing? N  ?Vision? N  ?Difficulty concentrating or making decisions? N  ?Walking or climbing stairs? N  ?Dressing or bathing? N  ?Doing errands, shopping? N  ?Preparing Food and eating ? N  ?Using the Toilet? N  ?In the past six months, have you accidently leaked urine? N  ?Do you have problems with loss of bowel control? N  ?Managing your Medications? N  ?Managing your Finances? N  ?Housekeeping or managing your Housekeeping? N  ?Some recent data might be hidden  ? ? ?Patient Care Team: ?Loman Brooklyn, FNP as PCP - General (Family Medicine) ?Minus Breeding, MD as Consulting Physician (Cardiology) ?Ladene Artist, MD as Consulting Physician (Gastroenterology) ?Sandford Craze, MD as Referring Physician (Unknown Physician Specialty) ?Latanya Maudlin, MD as Consulting Physician (Orthopedic Surgery) ?Harlen Labs, MD as Referring Physician (Optometry) ? ?Indicate any recent Medical  Services you may have received from other than [facility] providers in the past year (date may be approximate). ? ?   ?Assessment:  ? This is a routine wellness examination for Skanda. ? ?Hearing/Vision screen ?[place]

## 2022-03-20 ENCOUNTER — Other Ambulatory Visit: Payer: Self-pay | Admitting: Family Medicine

## 2022-03-20 DIAGNOSIS — E559 Vitamin D deficiency, unspecified: Secondary | ICD-10-CM

## 2022-05-19 ENCOUNTER — Encounter: Payer: Self-pay | Admitting: Family

## 2022-05-19 ENCOUNTER — Ambulatory Visit (INDEPENDENT_AMBULATORY_CARE_PROVIDER_SITE_OTHER): Payer: Medicare Other | Admitting: Family

## 2022-05-19 VITALS — BP 136/73 | HR 64 | Temp 97.8°F | Ht 70.0 in | Wt 211.0 lb

## 2022-05-19 DIAGNOSIS — M2550 Pain in unspecified joint: Secondary | ICD-10-CM

## 2022-05-19 DIAGNOSIS — W57XXXA Bitten or stung by nonvenomous insect and other nonvenomous arthropods, initial encounter: Secondary | ICD-10-CM

## 2022-05-19 DIAGNOSIS — E559 Vitamin D deficiency, unspecified: Secondary | ICD-10-CM

## 2022-05-19 DIAGNOSIS — S1086XA Insect bite of other specified part of neck, initial encounter: Secondary | ICD-10-CM

## 2022-05-19 MED ORDER — VITAMIN D (ERGOCALCIFEROL) 1.25 MG (50000 UNIT) PO CAPS: ORAL_CAPSULE | ORAL | 3 refills | Status: DC

## 2022-05-19 MED ORDER — DOXYCYCLINE HYCLATE 100 MG PO TABS: 100.0000 mg | ORAL_TABLET | Freq: Two times a day (BID) | ORAL | 0 refills | Status: AC

## 2022-05-19 NOTE — Progress Notes (Signed)
Subjective:    Patient ID: Samuel Wheeler, male    DOB: 03/24/1946, 76 y.o.   MRN: [id number]  Chief Complaint  Patient presents with   Joint Pain   Fatigue    Hx of RMSF. Wants labs done for it     HPI Pt presents today with fatigue and joint pain. States he has removed several tick bites over the last several months. The last one was on his left neck that was there for several days. He removed it two weeks ago. Reports body aches, muscle pain, fatigue. Denies any rash or fever.    Review of Systems  All other systems reviewed and are negative.      Objective:   Physical Exam Vitals reviewed.  Constitutional:      General: He is not in acute distress.    Appearance: He is well-developed.  HENT:     Head: Normocephalic.  Eyes:     General:        Right eye: No discharge.        Left eye: No discharge.     Pupils: Pupils are equal, round, and reactive to light.  Neck:     Thyroid: No thyromegaly.  Cardiovascular:     Rate and Rhythm: Normal rate and regular rhythm.     Heart sounds: Normal heart sounds. No murmur heard. Pulmonary:     Effort: Pulmonary effort is normal. No respiratory distress.     Breath sounds: Normal breath sounds. No wheezing.  Abdominal:     General: Bowel sounds are normal. There is no distension.     Palpations: Abdomen is soft.     Tenderness: There is no abdominal tenderness.  Musculoskeletal:        General: No tenderness. Normal range of motion.     Cervical back: Normal range of motion and neck supple.  Skin:    General: Skin is warm and dry.     Findings: No erythema or rash.          Comments: Erythemas rash 5X3 cm   Neurological:     Mental Status: He is alert and oriented to person, place, and time.     Cranial Nerves: No cranial nerve deficit.     Deep Tendon Reflexes: Reflexes are normal and symmetric.  Psychiatric:        Behavior: Behavior normal.        Thought Content: Thought content normal.        Judgment:  Judgment normal.    BP 136/73   Pulse 64   Temp 97.8 F (36.6 C) (Temporal)   Ht '5\' 10"'$  (1.778 m)   Wt 211 lb (95.7 kg)   BMI 30.28 kg/m        Assessment & Plan:  CLAUDIA ALVIZO comes in today with chief complaint of Joint Pain and Fatigue (Hx of RMSF. Wants labs done for it )   Diagnosis and orders addressed:  1. Vitamin D deficiency - Vitamin D, Ergocalciferol, (DRISDOL) 1.25 MG (50000 UNIT) CAPS capsule; TAKE 1 CAPSULE EVERY 7 DAYS  Dispense: 12 capsule; Refill: 3  2. Tick bite of other part of neck, initial encounter -Pt to report any new fever, joint pain, or rash -Wear protective clothing while outside- Long sleeves and long pants -Put insect repellent on all exposed skin and along clothing -Take a shower as soon as possible after being outside Follow up if symptoms worsen or do not improve  - doxycycline (VIBRA-TABS) 100  MG tablet; Take 1 tablet (100 mg total) by mouth 2 (two) times daily.  Dispense: 42 tablet; Refill: 0 - Lyme Disease Serology w/Reflex - Rocky mtn spotted fvr abs pnl(IgG+IgM)  3. Generalized joint pain   Evelina Dun, FNP

## 2022-05-19 NOTE — Patient Instructions (Signed)
Tick Bite Information, Adult  Ticks are insects that draw blood for food. They climb onto people and animals that brush against the leaves and grasses that they live in. They then bite and attach to the skin. Most ticks are harmless, but some ticks may carry germs that can cause disease. These germs are spread to a person through a bite. To lower your risk of getting a disease from a tick bite, make sure you: Take steps to prevent tick bites. Check for ticks after being outdoors where ticks live. Watch for symptoms of disease if a tick attached to you or if you think a tick bit you. How can I prevent tick bites? Take these steps to help prevent tick bites when you go outdoors in an area where ticks live: Before you go outdoors: Wear long sleeves and long pants to protect your skin from ticks. Wear light-colored clothing so you can see ticks easier. Tuck your pant legs into your socks. Apply insect repellent that has DEET (20% or higher), picaridin, or IR3535 in it to the following areas: Any bare skin. Avoid areas around the eyes and mouth. Edges of clothing, like the top of your boots, the bottom of your pant legs, and your sleeve cuffs. Consider applying an insect repellant that contains permethrin. Follow the instructions on the label. Do not apply permethrin directly to the skin. Instead, apply to the following areas: Clothing and shoes. Outdoor gear and tents. When you are outdoors: Avoid walking through areas with long grass. If you are walking on a trail, stay in the middle of the trail so your skin, hair, and clothing do not touch the bushes. Check for ticks on your clothing, hair, and skin often while you are outdoors. Check again before you go inside. When you go indoors: Check your clothing for ticks. Tumble dry clothes in a dryer on high heat for at least 10 minutes. If clothes are damp, additional time may be needed. If clothes require washing, use hot water. Check your gear and  pets. Shower soon after being outdoors. Check your body for ticks. Do a full body check using a mirror. Be sure to check your scalp, neck, armpits, waist, groin, and joint areas. These are the spots where ticks attach themselves most often. What is the best way to remove a tick?  Remove the tick as soon as possible. Removing it can prevent germs from passing to your body. Do not remove the tick with your bare fingers. Do not try to remove a tick with heat, alcohol, petroleum jelly, or fingernail polish. These things can cause the tick to salivate and regurgitate into your bloodstream, increasing your risk of getting a disease. To remove a tick that is crawling on your skin: Go outside and brush the tick off. Use tape or a lint roller. To remove a tick that is attached to your skin: Wash your hands. If you have gloves, put them on. Use a fine-tipped tweezer, curved forceps, or a tick-removal tool to gently grasp the tick as close to your skin and the tick's head as possible. Gently pull with a steady, upward, and even pressure until the tick lets go. While removing the tick: Take care to keep the tick's head attached to its body. Do not twist or jerk the tick. This can make the tick's head or mouth parts break off and stay in your skin. If this happens, try to remove the mouth parts with tweezers. If you cannot remove them, leave   the area alone and let the skin heal. Do not squeeze or crush the tick's body. This could force disease-carrying fluids from the tick into your body. What should I do after removing a tick? Clean the bite area and your hands with soap and water, rubbing alcohol, or an iodine scrub. If an antiseptic cream or ointment is available, put a small amount on the bite area. Wash and disinfect any tools that you used to remove the tick. How should I dispose of a tick? To dispose of a live tick, use one of these methods: Place it in rubbing alcohol. Place it in a sealed bag  or container, and throw it away. Wrap it tightly in tape, and throw it away. Flush it down the toilet. Where to find more information Centers for Disease Control and Prevention: cdc.gov/ticks U.S. Environmental Protection Agency: epa.gov/insect-repellents Contact a health care provider if: You have symptoms of a disease after a tick bite. Symptoms of a tick-borne disease can occur from moments after the tick bites to 30 days after a tick is removed. Symptoms include: Fever or chills. A red rash that makes a circle (bull's-eye rash) in the bite area. Redness and swelling in the bite area. Headache or stiff neck. Muscle, joint, or bone pain. Abnormal tiredness. Numbness in your legs or trouble walking or moving your legs. Tender or swollen lymph glands. Abdominal pain, vomiting, diarrhea, or weight loss. Get help right away if: You are not able to remove a tick. You have muscle weakness or paralysis. Your symptoms get worse or you experience new symptoms. You find an engorged tick on your skin and you are in an area where there is a higher risk of disease from ticks. Summary Ticks may carry germs that can spread to a person through a bite. These germs can cause disease. Wear protective clothing and use insect repellent to prevent tick bites. Follow the instructions on the label. If you find a tick on your body, remove it as soon as possible. If the tick is attached, do not try to remove it with heat, alcohol, petroleum jelly, or fingernail polish. If you have symptoms of a disease after being bitten by a tick, contact a health care provider. This information is not intended to replace advice given to you by your health care provider. Make sure you discuss any questions you have with your health care provider. Document Revised: 12/23/2021 Document Reviewed: 12/23/2021 Elsevier Patient Education  2023 Elsevier Inc.  

## 2022-05-21 LAB — ROCKY MTN SPOTTED FVR ABS PNL(IGG+IGM)
RMSF IgG: POSITIVE — AB
RMSF IgM: 0.35 index (ref 0.00–0.89)

## 2022-05-21 LAB — RMSF, IGG, IFA: RMSF, IGG, IFA: 1:64 {titer} — ABNORMAL HIGH

## 2022-05-21 LAB — LYME DISEASE SEROLOGY W/REFLEX: Lyme Total Antibody EIA: NEGATIVE

## 2022-05-26 ENCOUNTER — Other Ambulatory Visit: Payer: Self-pay | Admitting: Family Medicine

## 2022-05-26 DIAGNOSIS — R0602 Shortness of breath: Secondary | ICD-10-CM

## 2022-08-27 ENCOUNTER — Other Ambulatory Visit: Payer: Self-pay | Admitting: Family Medicine

## 2022-08-27 DIAGNOSIS — E782 Mixed hyperlipidemia: Secondary | ICD-10-CM

## 2022-09-10 ENCOUNTER — Other Ambulatory Visit: Payer: Self-pay | Admitting: Family Medicine

## 2022-09-10 DIAGNOSIS — E782 Mixed hyperlipidemia: Secondary | ICD-10-CM

## 2022-09-10 MED ORDER — NIACIN ER (ANTIHYPERLIPIDEMIC) 1000 MG PO TBCR: 1000.0000 mg | EXTENDED_RELEASE_TABLET | Freq: Every day | ORAL | 0 refills | Status: DC

## 2022-09-10 NOTE — Telephone Encounter (Signed)
Britney pt NTBS by new provider 30 days given 08/27/22

## 2022-09-10 NOTE — Addendum Note (Signed)
Addended by: Antonietta Barcelona D on: 09/10/2022 04:53 PM   Modules accepted: Orders

## 2022-09-10 NOTE — Telephone Encounter (Signed)
Apt scheduled 09/12/2022

## 2022-09-12 ENCOUNTER — Ambulatory Visit (INDEPENDENT_AMBULATORY_CARE_PROVIDER_SITE_OTHER): Payer: Medicare Other | Admitting: Nurse Practitioner

## 2022-09-12 ENCOUNTER — Encounter: Payer: Self-pay | Admitting: Nurse Practitioner

## 2022-09-12 VITALS — BP 173/77 | HR 68 | Temp 98.8°F | Ht 70.0 in | Wt 211.0 lb

## 2022-09-12 DIAGNOSIS — E559 Vitamin D deficiency, unspecified: Secondary | ICD-10-CM

## 2022-09-12 DIAGNOSIS — R030 Elevated blood-pressure reading, without diagnosis of hypertension: Secondary | ICD-10-CM

## 2022-09-12 DIAGNOSIS — A6000 Herpesviral infection of urogenital system, unspecified: Secondary | ICD-10-CM | POA: Diagnosis not present

## 2022-09-12 DIAGNOSIS — N400 Enlarged prostate without lower urinary tract symptoms: Secondary | ICD-10-CM | POA: Diagnosis not present

## 2022-09-12 DIAGNOSIS — J302 Other seasonal allergic rhinitis: Secondary | ICD-10-CM

## 2022-09-12 DIAGNOSIS — E782 Mixed hyperlipidemia: Secondary | ICD-10-CM

## 2022-09-12 DIAGNOSIS — R0602 Shortness of breath: Secondary | ICD-10-CM

## 2022-09-12 DIAGNOSIS — R600 Localized edema: Secondary | ICD-10-CM

## 2022-09-12 MED ORDER — ATORVASTATIN CALCIUM 20 MG PO TABS: 20.0000 mg | ORAL_TABLET | Freq: Every day | ORAL | 2 refills | Status: AC

## 2022-09-12 MED ORDER — CETIRIZINE HCL 10 MG PO TABS: 10.0000 mg | ORAL_TABLET | Freq: Every day | ORAL | 2 refills | Status: AC

## 2022-09-12 MED ORDER — VALACYCLOVIR HCL 1 G PO TABS: 1000.0000 mg | ORAL_TABLET | Freq: Every day | ORAL | 2 refills | Status: AC

## 2022-09-12 MED ORDER — TAMSULOSIN HCL 0.4 MG PO CAPS: 0.4000 mg | ORAL_CAPSULE | Freq: Every day | ORAL | 2 refills | Status: AC

## 2022-09-12 MED ORDER — FLUTICASONE PROPIONATE 50 MCG/ACT NA SUSP: 2.0000 | Freq: Every day | NASAL | 2 refills | Status: AC

## 2022-09-12 MED ORDER — VITAMIN D (ERGOCALCIFEROL) 1.25 MG (50000 UNIT) PO CAPS: ORAL_CAPSULE | ORAL | 3 refills | Status: AC

## 2022-09-12 MED ORDER — ALBUTEROL SULFATE HFA 108 (90 BASE) MCG/ACT IN AERS: 2.0000 | INHALATION_SPRAY | RESPIRATORY_TRACT | 1 refills | Status: AC | PRN

## 2022-09-12 MED ORDER — NAPROXEN 500 MG PO TABS: 500.0000 mg | ORAL_TABLET | Freq: Two times a day (BID) | ORAL | 0 refills | Status: AC

## 2022-09-12 MED ORDER — NIACIN ER (ANTIHYPERLIPIDEMIC) 1000 MG PO TBCR: 1000.0000 mg | EXTENDED_RELEASE_TABLET | Freq: Every day | ORAL | 0 refills | Status: AC

## 2022-09-12 MED ORDER — FINASTERIDE 5 MG PO TABS: 5.0000 mg | ORAL_TABLET | Freq: Every day | ORAL | 3 refills | Status: AC

## 2022-09-12 NOTE — Assessment & Plan Note (Signed)
For bilateral lower extremity edema, advised patient to elevate feet, DME for compression socks completed.  Avoid high sodium diet.  Follow-up with unresolved symptoms.

## 2022-09-12 NOTE — Assessment & Plan Note (Signed)
Rx refill for acyclovir sent to pharmacy.

## 2022-09-12 NOTE — Assessment & Plan Note (Signed)
Labs completed results pending.
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
## 2022-09-12 NOTE — Patient Instructions (Signed)
Edema  Edema is when you have too much fluid in your body or under your skin. Edema may make your legs, feet, and ankles swell. Swelling often happens in looser tissues, such as around your eyes. This is a common condition. It gets more common as you get older. There are many possible causes of edema. These include: Eating too much salt (sodium). Being on your feet or sitting for a long time. Certain medical conditions, such as: Pregnancy. Heart failure. Liver disease. Kidney disease. Cancer. Hot weather may make edema worse. Edema is usually painless. Your skin may look swollen or shiny. Follow these instructions at home: Medicines Take over-the-counter and prescription medicines only as told by your doctor. Your doctor may prescribe a medicine to help your body get rid of extra water (diuretic). Take this medicine if you are told to take it. Eating and drinking Eat a low-salt (low-sodium) diet as told by your doctor. Sometimes, eating less salt may reduce swelling. Depending on the cause of your swelling, you may need to limit how much fluid you drink (fluid restriction). General instructions Raise the injured area above the level of your heart while you are sitting or lying down. Do not sit still or stand for a long time. Do not wear tight clothes. Do not wear garters on your upper legs. Exercise your legs. This can help the swelling go down. Wear compression stockings as told by your doctor. It is important that these are the right size. These should be prescribed by your doctor to prevent possible injuries. If elastic bandages or wraps are recommended, use them as told by your doctor. Contact a doctor if: Treatment is not working. You have heart, liver, or kidney disease and have symptoms of edema. You have sudden and unexplained weight gain. Get help right away if: You have shortness of breath or chest pain. You cannot breathe when you lie down. You have pain, redness, or  warmth in the swollen areas. You have heart, liver, or kidney disease and get edema all of a sudden. You have a fever and your symptoms get worse all of a sudden. These symptoms may be an emergency. Get help right away. Call 911. Do not wait to see if the symptoms will go away. Do not drive yourself to the hospital. Summary Edema is when you have too much fluid in your body or under your skin. Edema may make your legs, feet, and ankles swell. Swelling often happens in looser tissues, such as around your eyes. Raise the injured area above the level of your heart while you are sitting or lying down. Follow your doctor's instructions about diet and how much fluid you can drink. This information is not intended to replace advice given to you by your health care provider. Make sure you discuss any questions you have with your health care provider. Document Revised: 05/27/2021 Document Reviewed: 05/27/2021 Elsevier Patient Education  [place]. Dyslipidemia Dyslipidemia is an imbalance of waxy, fat-like substances (lipids) in the blood. The body needs lipids in small amounts. Dyslipidemia often involves a high level of cholesterol or triglycerides, which are types of lipids. Common forms of dyslipidemia include: High levels of LDL cholesterol. LDL is the type of cholesterol that causes fatty deposits (plaques) to build up in the blood vessels that carry blood away from the heart (arteries). Low levels of HDL cholesterol. HDL cholesterol is the type of cholesterol that protects against heart disease. High levels of HDL remove the LDL buildup from  arteries. High levels of triglycerides. Triglycerides are a fatty substance in the blood that is linked to a buildup of plaques in the arteries. What are the causes? There are two main types of dyslipidemia: primary and secondary. Primary dyslipidemia is caused by changes (mutations) in genes that are passed down through families (inherited). These  mutations cause several types of dyslipidemia. Secondary dyslipidemia may be caused by various risk factors that can lead to the disease, such as lifestyle choices and certain medical conditions. What increases the risk? You are more likely to develop this condition if you are an older man or if you are a woman who has gone through menopause. Other risk factors include: Having a family history of dyslipidemia. Taking certain medicines, including birth control pills, steroids, some diuretics, and beta-blockers. Eating a diet high in saturated fat. Smoking cigarettes or excessive alcohol intake. Having certain medical conditions such as diabetes, polycystic ovary syndrome (PCOS), kidney disease, liver disease, or hypothyroidism. Not exercising regularly. Being overweight or obese with too much belly fat. What are the signs or symptoms? In most cases, dyslipidemia does not usually cause any symptoms. In severe cases, very high lipid levels can cause: Fatty bumps under the skin (xanthomas). A white or gray ring around the black center (pupil) of the eye. Very high triglyceride levels can cause inflammation of the pancreas (pancreatitis). How is this diagnosed? Your health care provider may diagnose dyslipidemia based on a routine blood test (fasting blood test). Because most people do not have symptoms of the condition, this blood testing (lipid profile) is done on adults age 82 and older and is repeated every 4-6 years. This test checks: Total cholesterol. This measures the total amount of cholesterol in your blood, including LDL cholesterol, HDL cholesterol, and triglycerides. A healthy number is below 200 mg/dL (5.17 mmol/L). LDL cholesterol. The target number for LDL cholesterol is different for each person, depending on individual risk factors. A healthy number is usually below 100 mg/dL (2.59 mmol/L). Ask your health care provider what your LDL cholesterol should be. HDL cholesterol. An HDL  level of 60 mg/dL (1.55 mmol/L) or higher is best because it helps to protect against heart disease. A number below 40 mg/dL (1.03 mmol/L) for men or below 50 mg/dL (1.29 mmol/L) for women increases the risk for heart disease. Triglycerides. A healthy triglyceride number is below 150 mg/dL (1.69 mmol/L). If your lipid profile is abnormal, your health care provider may do other blood tests. How is this treated? Treatment depends on the type of dyslipidemia that you have and your other risk factors for heart disease and stroke. Your health care provider will have a target range for your lipid levels based on this information. Treatment for dyslipidemia starts with lifestyle changes, such as diet and exercise. Your health care provider may recommend that you: Get regular exercise. Make changes to your diet. Quit smoking if you smoke. Limit your alcohol intake. If diet changes and exercise do not help you reach your goals, your health care provider may also prescribe medicine to lower lipids. The most commonly prescribed type of medicine lowers your LDL cholesterol (statin drug). If you have a high triglyceride level, your provider may prescribe another type of drug (fibrate) or an omega-3 fish oil supplement, or both. Follow these instructions at home: Eating and drinking  Follow instructions from your health care provider or dietitian about eating or drinking restrictions. Eat a healthy diet as told by your health care provider. This can help you  reach and maintain a healthy weight, lower your LDL cholesterol, and raise your HDL cholesterol. This may include: Limiting your calories, if you are overweight. Eating more fruits, vegetables, whole grains, fish, and lean meats. Limiting saturated fat, trans fat, and cholesterol. Do not drink alcohol if: Your health care provider tells you not to drink. You are pregnant, may be pregnant, or are planning to become pregnant. If you drink alcohol: Limit  how much you have to: 0-1 drink a day for women. 0-2 drinks a day for men. Know how much alcohol is in your drink. In the [place] one drink equals one 12 oz bottle of beer (355 mL), one 5 oz glass of wine (148 mL), or one 1 oz glass of hard liquor (44 mL). Activity Get regular exercise. Start an exercise and strength training program as told by your health care provider. Ask your health care provider what activities are safe for you. Your health care provider may recommend: 30 minutes of aerobic activity 4-6 days a week. Brisk walking is an example of aerobic activity. Strength training 2 days a week. General instructions Do not use any products that contain nicotine or tobacco. These products include cigarettes, chewing tobacco, and vaping devices, such as e-cigarettes. If you need help quitting, ask your health care provider. Take over-the-counter and prescription medicines only as told by your health care provider. This includes supplements. Keep all follow-up visits. This is important. Contact a health care provider if: You are having trouble sticking to your exercise or diet plan. You are struggling to quit smoking or to control your use of alcohol. Summary Dyslipidemia often involves a high level of cholesterol or triglycerides, which are types of lipids. Treatment depends on the type of dyslipidemia that you have and your other risk factors for heart disease and stroke. Treatment for dyslipidemia starts with lifestyle changes, such as diet and exercise. Your health care provider may prescribe medicine to lower lipids. This information is not intended to replace advice given to you by your health care provider. Make sure you discuss any questions you have with your health care provider. Document Revised: 04/25/2022 Document Reviewed: 11/26/2020 Elsevier Patient Education  [place]. Hypertension, Adult High blood pressure (hypertension) is when the force of blood pumping through  the arteries is too strong. The arteries are the blood vessels that carry blood from the heart throughout the body. Hypertension forces the heart to work harder to pump blood and may cause arteries to become narrow or stiff. Untreated or uncontrolled hypertension can lead to a heart attack, heart failure, a stroke, kidney disease, and other problems. A blood pressure reading consists of a higher number over a lower number. Ideally, your blood pressure should be below 120/80. The first ("top") number is called the systolic pressure. It is a measure of the pressure in your arteries as your heart beats. The second ("bottom") number is called the diastolic pressure. It is a measure of the pressure in your arteries as the heart relaxes. What are the causes? The exact cause of this condition is not known. There are some conditions that result in high blood pressure. What increases the risk? Certain factors may make you more likely to develop high blood pressure. Some of these risk factors are under your control, including: Smoking. Not getting enough exercise or physical activity. Being overweight. Having too much fat, sugar, calories, or salt (sodium) in your diet. Drinking too much alcohol. Other risk factors include: Having a personal history of  heart disease, diabetes, high cholesterol, or kidney disease. Stress. Having a family history of high blood pressure and high cholesterol. Having obstructive sleep apnea. Age. The risk increases with age. What are the signs or symptoms? High blood pressure may not cause symptoms. Very high blood pressure (hypertensive crisis) may cause: Headache. Fast or irregular heartbeats (palpitations). Shortness of breath. Nosebleed. Nausea and vomiting. Vision changes. Severe chest pain, dizziness, and seizures. How is this diagnosed? This condition is diagnosed by measuring your blood pressure while you are seated, with your arm resting on a flat surface, your  legs uncrossed, and your feet flat on the floor. The cuff of the blood pressure monitor will be placed directly against the skin of your upper arm at the level of your heart. Blood pressure should be measured at least twice using the same arm. Certain conditions can cause a difference in blood pressure between your right and left arms. If you have a high blood pressure reading during one visit or you have normal blood pressure with other risk factors, you may be asked to: Return on a different day to have your blood pressure checked again. Monitor your blood pressure at home for 1 week or longer. If you are diagnosed with hypertension, you may have other blood or imaging tests to help your health care provider understand your overall risk for other conditions. How is this treated? This condition is treated by making healthy lifestyle changes, such as eating healthy foods, exercising more, and reducing your alcohol intake. You may be referred for counseling on a healthy diet and physical activity. Your health care provider may prescribe medicine if lifestyle changes are not enough to get your blood pressure under control and if: Your systolic blood pressure is above 130. Your diastolic blood pressure is above 80. Your personal target blood pressure may vary depending on your medical conditions, your age, and other factors. Follow these instructions at home: Eating and drinking  Eat a diet that is high in fiber and potassium, and low in sodium, added sugar, and fat. An example of this eating plan is called the DASH diet. DASH stands for Dietary Approaches to Stop Hypertension. To eat this way: Eat plenty of fresh fruits and vegetables. Try to fill one half of your plate at each meal with fruits and vegetables. Eat whole grains, such as whole-wheat pasta, brown rice, or whole-grain bread. Fill about one fourth of your plate with whole grains. Eat or drink low-fat dairy products, such as skim milk or  low-fat yogurt. Avoid fatty cuts of meat, processed or cured meats, and poultry with skin. Fill about one fourth of your plate with lean proteins, such as fish, chicken without skin, beans, eggs, or tofu. Avoid pre-made and processed foods. These tend to be higher in sodium, added sugar, and fat. Reduce your daily sodium intake. Many people with hypertension should eat less than 1,500 mg of sodium a day. Do not drink alcohol if: Your health care provider tells you not to drink. You are pregnant, may be pregnant, or are planning to become pregnant. If you drink alcohol: Limit how much you have to: 0-1 drink a day for women. 0-2 drinks a day for men. Know how much alcohol is in your drink. In the [place] one drink equals one 12 oz bottle of beer (355 mL), one 5 oz glass of wine (148 mL), or one 1 oz glass of hard liquor (44 mL). Lifestyle  Work with your health care provider to maintain  a healthy body weight or to lose weight. Ask what an ideal weight is for you. Get at least 30 minutes of exercise that causes your heart to beat faster (aerobic exercise) most days of the week. Activities may include walking, swimming, or biking. Include exercise to strengthen your muscles (resistance exercise), such as Pilates or lifting weights, as part of your weekly exercise routine. Try to do these types of exercises for 30 minutes at least 3 days a week. Do not use any products that contain nicotine or tobacco. These products include cigarettes, chewing tobacco, and vaping devices, such as e-cigarettes. If you need help quitting, ask your health care provider. Monitor your blood pressure at home as told by your health care provider. Keep all follow-up visits. This is important. Medicines Take over-the-counter and prescription medicines only as told by your health care provider. Follow directions carefully. Blood pressure medicines must be taken as prescribed. Do not skip doses of blood pressure medicine.  Doing this puts you at risk for problems and can make the medicine less effective. Ask your health care provider about side effects or reactions to medicines that you should watch for. Contact a health care provider if you: Think you are having a reaction to a medicine you are taking. Have headaches that keep coming back (recurring). Feel dizzy. Have swelling in your ankles. Have trouble with your vision. Get help right away if you: Develop a severe headache or confusion. Have unusual weakness or numbness. Feel faint. Have severe pain in your chest or abdomen. Vomit repeatedly. Have trouble breathing. These symptoms may be an emergency. Get help right away. Call 911. Do not wait to see if the symptoms will go away. Do not drive yourself to the hospital. Summary Hypertension is when the force of blood pumping through your arteries is too strong. If this condition is not controlled, it may put you at risk for serious complications. Your personal target blood pressure may vary depending on your medical conditions, your age, and other factors. For most people, a normal blood pressure is less than 120/80. Hypertension is treated with lifestyle changes, medicines, or a combination of both. Lifestyle changes include losing weight, eating a healthy, low-sodium diet, exercising more, and limiting alcohol. This information is not intended to replace advice given to you by your health care provider. Make sure you discuss any questions you have with your health care provider. Document Revised: 07/30/2021 Document Reviewed: 07/30/2021 Elsevier Patient Education  [place].

## 2022-09-13 LAB — CBC WITH DIFFERENTIAL/PLATELET
Basophils Absolute: 0 10*3/uL (ref 0.0–0.2)
Basos: 1 %
EOS (ABSOLUTE): 0.1 10*3/uL (ref 0.0–0.4)
Eos: 2 %
Hematocrit: 41.6 % (ref 37.5–51.0)
Hemoglobin: 14.1 g/dL (ref 13.0–17.7)
Immature Grans (Abs): 0 10*3/uL (ref 0.0–0.1)
Immature Granulocytes: 0 %
Lymphocytes Absolute: 2 10*3/uL (ref 0.7–3.1)
Lymphs: 34 %
MCH: 30.9 pg (ref 26.6–33.0)
MCHC: 33.9 g/dL (ref 31.5–35.7)
MCV: 91 fL (ref 79–97)
Monocytes Absolute: 0.5 10*3/uL (ref 0.1–0.9)
Monocytes: 8 %
Neutrophils Absolute: 3.3 10*3/uL (ref 1.4–7.0)
Neutrophils: 55 %
Platelets: 243 10*3/uL (ref 150–450)
RBC: 4.56 x10E6/uL (ref 4.14–5.80)
RDW: 12.9 % (ref 11.6–15.4)
WBC: 6 10*3/uL (ref 3.4–10.8)

## 2022-09-13 LAB — CMP14+EGFR
ALT: 13 IU/L (ref 0–44)
AST: 14 IU/L (ref 0–40)
Albumin/Globulin Ratio: 1.6 (ref 1.2–2.2)
Albumin: 3.9 g/dL (ref 3.8–4.8)
Alkaline Phosphatase: 78 IU/L (ref 44–121)
BUN/Creatinine Ratio: 13 (ref 10–24)
BUN: 12 mg/dL (ref 8–27)
Bilirubin Total: 0.5 mg/dL (ref 0.0–1.2)
CO2: 23 mmol/L (ref 20–29)
Calcium: 8.8 mg/dL (ref 8.6–10.2)
Chloride: 105 mmol/L (ref 96–106)
Creatinine, Ser: 0.94 mg/dL (ref 0.76–1.27)
Globulin, Total: 2.5 g/dL (ref 1.5–4.5)
Glucose: 105 mg/dL — ABNORMAL HIGH (ref 70–99)
Potassium: 4.2 mmol/L (ref 3.5–5.2)
Sodium: 140 mmol/L (ref 134–144)
Total Protein: 6.4 g/dL (ref 6.0–8.5)
eGFR: 85 mL/min/{1.73_m2} (ref >59)

## 2022-09-13 LAB — LIPID PANEL
Chol/HDL Ratio: 2.8 ratio (ref 0.0–5.0)
Cholesterol, Total: 125 mg/dL (ref 100–199)
HDL: 45 mg/dL (ref >39)
LDL Chol Calc (NIH): 64 mg/dL (ref 0–99)
Triglycerides: 81 mg/dL (ref 0–149)
VLDL Cholesterol Cal: 16 mg/dL (ref 5–40)

## 2022-09-13 LAB — VITAMIN D 25 HYDROXY (VIT D DEFICIENCY, FRACTURES): Vit D, 25-Hydroxy: 50.7 ng/mL (ref 30.0–100.0)

## 2022-12-08 ENCOUNTER — Telehealth: Payer: Self-pay | Admitting: Nurse Practitioner

## 2022-12-08 NOTE — Telephone Encounter (Signed)
Called patient to schedule Medicare Annual Wellness Visit (AWV). Left message for patient to call back and schedule Medicare Annual Wellness Visit (AWV).  Last date of AWV: 12/19/2021   Please schedule an appointment at any time with either Mickel Baas or [place], NHA[facility]. .  If any questions, please contact me at [phone].  Thank you,  Colletta [place],  [place] Direct Dial ??[phone]

## 2023-01-12 ENCOUNTER — Encounter: Payer: Self-pay | Admitting: Gastroenterology

## 2023-01-13 ENCOUNTER — Telehealth: Payer: Self-pay | Admitting: Nurse Practitioner

## 2023-01-13 NOTE — Telephone Encounter (Signed)
Contacted Samuel Wheeler to schedule their annual wellness visit. Patient declined to schedule AWV at this time.   Patient now lives in [place].  Thank you,  Judeth Cornfield,  [facility] Clinical Support [facility] AWV Program Direct Dial ??[phone]

## 2023-01-13 NOTE — Telephone Encounter (Signed)
Called patient to schedule Medicare Annual Wellness Visit (AWV). Left message for patient to call back and schedule Medicare Annual Wellness Visit (AWV).  Last date of AWV: 12/19/2021  Please schedule an appointment at any time with either Vernona Rieger or [place], NHA's. .  If any questions, please contact me at [phone].  Thank you,  Judeth Cornfield,  AMB Clinical Support [facility] AWV Program Direct Dial ??[phone]
